# Patient Record
Sex: Female | Born: 1935 | Race: White | Hispanic: No | Smoking: Former smoker
Health system: Southern US, Community
[De-identification: ages and names within clinical notes are randomized; demographics above are authoritative.]

## PROBLEM LIST (undated history)

## (undated) DIAGNOSIS — Z86010 Personal history of colonic polyps: Secondary | ICD-10-CM

## (undated) DIAGNOSIS — I10 Essential (primary) hypertension: Secondary | ICD-10-CM

## (undated) DIAGNOSIS — K589 Irritable bowel syndrome without diarrhea: Secondary | ICD-10-CM

## (undated) DIAGNOSIS — K5730 Diverticulosis of large intestine without perforation or abscess without bleeding: Secondary | ICD-10-CM

## (undated) DIAGNOSIS — M069 Rheumatoid arthritis, unspecified: Secondary | ICD-10-CM

## (undated) DIAGNOSIS — J8410 Pulmonary fibrosis, unspecified: Secondary | ICD-10-CM

## (undated) DIAGNOSIS — I499 Cardiac arrhythmia, unspecified: Secondary | ICD-10-CM

## (undated) DIAGNOSIS — E785 Hyperlipidemia, unspecified: Secondary | ICD-10-CM

## (undated) HISTORY — DX: Pulmonary fibrosis, unspecified: J84.10

## (undated) HISTORY — DX: Personal history of colonic polyps: Z86.010

## (undated) HISTORY — DX: Essential (primary) hypertension: I10

## (undated) HISTORY — DX: Cardiac arrhythmia, unspecified: I49.9

## (undated) HISTORY — DX: Diverticulosis of large intestine without perforation or abscess without bleeding: K57.30

## (undated) HISTORY — PX: APPENDECTOMY: SHX54

## (undated) HISTORY — DX: Irritable bowel syndrome, unspecified: K58.9

## (undated) HISTORY — PX: CYSTECTOMY: SUR359

## (undated) HISTORY — PX: CHOLECYSTECTOMY: SHX55

## (undated) HISTORY — DX: Rheumatoid arthritis, unspecified: M06.9

## (undated) HISTORY — PX: ABDOMINAL HYSTERECTOMY: SHX81

## (undated) HISTORY — DX: Hyperlipidemia, unspecified: E78.5

---

## 1981-05-15 ENCOUNTER — Encounter: Payer: Self-pay | Admitting: Internal Medicine

## 1999-02-24 ENCOUNTER — Encounter: Admission: RE | Admit: 1999-02-24 | Discharge: 1999-02-24 | Payer: Self-pay | Admitting: Family Medicine

## 1999-03-03 ENCOUNTER — Other Ambulatory Visit: Admission: RE | Admit: 1999-03-03 | Discharge: 1999-03-03 | Payer: Self-pay | Admitting: Family Medicine

## 2000-01-12 ENCOUNTER — Ambulatory Visit (HOSPITAL_COMMUNITY): Admission: RE | Admit: 2000-01-12 | Discharge: 2000-01-12 | Payer: Self-pay | Admitting: Gastroenterology

## 2000-01-12 ENCOUNTER — Encounter (INDEPENDENT_AMBULATORY_CARE_PROVIDER_SITE_OTHER): Payer: Self-pay | Admitting: Specialist

## 2000-02-25 ENCOUNTER — Encounter: Payer: Self-pay | Admitting: Family Medicine

## 2000-02-25 ENCOUNTER — Encounter: Admission: RE | Admit: 2000-02-25 | Discharge: 2000-02-25 | Payer: Self-pay | Admitting: Family Medicine

## 2000-08-09 ENCOUNTER — Other Ambulatory Visit: Admission: RE | Admit: 2000-08-09 | Discharge: 2000-08-09 | Payer: Self-pay | Admitting: Family Medicine

## 2001-03-21 ENCOUNTER — Encounter: Payer: Self-pay | Admitting: Family Medicine

## 2001-03-21 ENCOUNTER — Encounter: Admission: RE | Admit: 2001-03-21 | Discharge: 2001-03-21 | Payer: Self-pay | Admitting: Family Medicine

## 2002-03-22 ENCOUNTER — Encounter: Admission: RE | Admit: 2002-03-22 | Discharge: 2002-03-22 | Payer: Self-pay | Admitting: Family Medicine

## 2002-03-22 ENCOUNTER — Encounter: Payer: Self-pay | Admitting: Family Medicine

## 2003-08-12 ENCOUNTER — Encounter: Admission: RE | Admit: 2003-08-12 | Discharge: 2003-08-12 | Payer: Self-pay | Admitting: Family Medicine

## 2003-09-09 ENCOUNTER — Ambulatory Visit (HOSPITAL_COMMUNITY): Admission: RE | Admit: 2003-09-09 | Discharge: 2003-09-10 | Payer: Self-pay | Admitting: General Surgery

## 2003-09-09 ENCOUNTER — Encounter (INDEPENDENT_AMBULATORY_CARE_PROVIDER_SITE_OTHER): Payer: Self-pay | Admitting: *Deleted

## 2004-05-21 ENCOUNTER — Ambulatory Visit: Payer: Self-pay | Admitting: Internal Medicine

## 2004-06-03 ENCOUNTER — Ambulatory Visit: Payer: Self-pay | Admitting: Internal Medicine

## 2004-06-21 ENCOUNTER — Ambulatory Visit: Payer: Self-pay | Admitting: Internal Medicine

## 2004-06-23 ENCOUNTER — Ambulatory Visit (HOSPITAL_COMMUNITY): Admission: RE | Admit: 2004-06-23 | Discharge: 2004-06-23 | Payer: Self-pay | Admitting: Internal Medicine

## 2004-06-24 ENCOUNTER — Ambulatory Visit: Payer: Self-pay

## 2004-08-10 ENCOUNTER — Ambulatory Visit: Payer: Self-pay | Admitting: Internal Medicine

## 2004-08-23 ENCOUNTER — Ambulatory Visit: Payer: Self-pay | Admitting: Internal Medicine

## 2005-02-21 ENCOUNTER — Ambulatory Visit: Payer: Self-pay | Admitting: Internal Medicine

## 2005-04-20 ENCOUNTER — Ambulatory Visit: Payer: Self-pay | Admitting: Internal Medicine

## 2005-04-25 ENCOUNTER — Ambulatory Visit: Payer: Self-pay | Admitting: Internal Medicine

## 2005-05-03 ENCOUNTER — Ambulatory Visit: Payer: Self-pay | Admitting: Internal Medicine

## 2005-05-19 ENCOUNTER — Ambulatory Visit: Payer: Self-pay | Admitting: Internal Medicine

## 2005-05-30 ENCOUNTER — Ambulatory Visit: Payer: Self-pay

## 2005-07-19 ENCOUNTER — Ambulatory Visit: Payer: Self-pay | Admitting: Internal Medicine

## 2005-08-25 ENCOUNTER — Ambulatory Visit: Payer: Self-pay | Admitting: Internal Medicine

## 2005-08-30 ENCOUNTER — Ambulatory Visit: Payer: Self-pay | Admitting: Internal Medicine

## 2005-09-29 ENCOUNTER — Ambulatory Visit: Payer: Self-pay | Admitting: Internal Medicine

## 2005-12-01 ENCOUNTER — Ambulatory Visit: Payer: Self-pay | Admitting: Internal Medicine

## 2005-12-06 ENCOUNTER — Encounter: Payer: Self-pay | Admitting: Cardiology

## 2005-12-06 ENCOUNTER — Ambulatory Visit: Payer: Self-pay

## 2005-12-09 ENCOUNTER — Ambulatory Visit: Payer: Self-pay | Admitting: Internal Medicine

## 2005-12-30 ENCOUNTER — Ambulatory Visit: Payer: Self-pay | Admitting: Internal Medicine

## 2006-01-06 ENCOUNTER — Ambulatory Visit: Payer: Self-pay | Admitting: Internal Medicine

## 2006-02-13 ENCOUNTER — Ambulatory Visit: Payer: Self-pay | Admitting: Internal Medicine

## 2006-03-16 DIAGNOSIS — K5730 Diverticulosis of large intestine without perforation or abscess without bleeding: Secondary | ICD-10-CM

## 2006-03-16 HISTORY — DX: Diverticulosis of large intestine without perforation or abscess without bleeding: K57.30

## 2006-03-22 ENCOUNTER — Ambulatory Visit: Payer: Self-pay | Admitting: Gastroenterology

## 2006-04-10 ENCOUNTER — Encounter (INDEPENDENT_AMBULATORY_CARE_PROVIDER_SITE_OTHER): Payer: Self-pay | Admitting: *Deleted

## 2006-04-10 ENCOUNTER — Ambulatory Visit: Payer: Self-pay | Admitting: Gastroenterology

## 2006-04-10 DIAGNOSIS — Z860100 Personal history of colon polyps, unspecified: Secondary | ICD-10-CM

## 2006-04-10 HISTORY — DX: Personal history of colonic polyps: Z86.010

## 2006-04-10 HISTORY — DX: Personal history of colon polyps, unspecified: Z86.0100

## 2006-04-25 ENCOUNTER — Ambulatory Visit: Payer: Self-pay | Admitting: Internal Medicine

## 2006-07-25 ENCOUNTER — Ambulatory Visit: Payer: Self-pay | Admitting: Internal Medicine

## 2006-12-06 ENCOUNTER — Encounter: Payer: Self-pay | Admitting: Internal Medicine

## 2006-12-06 DIAGNOSIS — I714 Abdominal aortic aneurysm, without rupture, unspecified: Secondary | ICD-10-CM | POA: Insufficient documentation

## 2006-12-06 DIAGNOSIS — I10 Essential (primary) hypertension: Secondary | ICD-10-CM | POA: Insufficient documentation

## 2006-12-06 DIAGNOSIS — E785 Hyperlipidemia, unspecified: Secondary | ICD-10-CM

## 2006-12-06 DIAGNOSIS — K5730 Diverticulosis of large intestine without perforation or abscess without bleeding: Secondary | ICD-10-CM | POA: Insufficient documentation

## 2007-10-01 ENCOUNTER — Encounter: Payer: Self-pay | Admitting: Internal Medicine

## 2007-10-09 ENCOUNTER — Telehealth: Payer: Self-pay | Admitting: Internal Medicine

## 2007-10-10 ENCOUNTER — Telehealth: Payer: Self-pay | Admitting: Internal Medicine

## 2007-10-19 ENCOUNTER — Ambulatory Visit: Payer: Self-pay | Admitting: Internal Medicine

## 2007-10-19 DIAGNOSIS — M069 Rheumatoid arthritis, unspecified: Secondary | ICD-10-CM

## 2007-10-19 LAB — CONVERTED CEMR LAB
BUN: 12 mg/dL (ref 6–23)
CO2: 30 meq/L (ref 19–32)
Creatinine, Ser: 0.8 mg/dL (ref 0.4–1.2)
GFR calc Af Amer: 90 mL/min
GFR calc non Af Amer: 75 mL/min
Glucose, Bld: 108 mg/dL — ABNORMAL HIGH (ref 70–99)
Potassium: 5 meq/L (ref 3.5–5.1)
Sodium: 142 meq/L (ref 135–145)

## 2007-11-08 ENCOUNTER — Encounter: Payer: Self-pay | Admitting: Internal Medicine

## 2007-11-09 ENCOUNTER — Ambulatory Visit: Payer: Self-pay

## 2007-11-09 ENCOUNTER — Encounter: Payer: Self-pay | Admitting: Internal Medicine

## 2007-12-29 ENCOUNTER — Telehealth: Payer: Self-pay | Admitting: Internal Medicine

## 2008-04-06 ENCOUNTER — Emergency Department (HOSPITAL_COMMUNITY): Admission: EM | Admit: 2008-04-06 | Discharge: 2008-04-06 | Payer: Self-pay | Admitting: Family Medicine

## 2008-05-24 ENCOUNTER — Emergency Department (HOSPITAL_COMMUNITY): Admission: EM | Admit: 2008-05-24 | Discharge: 2008-05-24 | Payer: Self-pay | Admitting: Family Medicine

## 2008-07-25 ENCOUNTER — Encounter: Admission: RE | Admit: 2008-07-25 | Discharge: 2008-07-25 | Payer: Self-pay | Admitting: Internal Medicine

## 2008-12-17 ENCOUNTER — Encounter: Payer: Self-pay | Admitting: Nurse Practitioner

## 2009-01-01 ENCOUNTER — Telehealth: Payer: Self-pay | Admitting: Gastroenterology

## 2009-01-05 ENCOUNTER — Ambulatory Visit: Payer: Self-pay | Admitting: Internal Medicine

## 2009-01-05 DIAGNOSIS — R439 Unspecified disturbances of smell and taste: Secondary | ICD-10-CM

## 2009-01-05 DIAGNOSIS — R634 Abnormal weight loss: Secondary | ICD-10-CM

## 2009-01-05 LAB — CONVERTED CEMR LAB
Calcium: 9.4 mg/dL (ref 8.4–10.5)
Chloride: 104 meq/L (ref 96–112)
Creatinine, Ser: 0.9 mg/dL (ref 0.4–1.2)
GFR calc non Af Amer: 64.84 mL/min (ref >60)
HCT: 48.7 % — ABNORMAL HIGH (ref 36.0–46.0)
MCV: 84.9 fL (ref 78.0–100.0)
Neutrophils Relative %: 70.2 % (ref 43.0–77.0)
Platelets: 253 10*3/uL (ref 150.0–400.0)
Potassium: 3.5 meq/L (ref 3.5–5.1)
RBC: 5.73 M/uL — ABNORMAL HIGH (ref 3.87–5.11)
Sodium: 147 meq/L — ABNORMAL HIGH (ref 135–145)
WBC: 9.1 10*3/uL (ref 4.5–10.5)

## 2009-01-06 ENCOUNTER — Ambulatory Visit: Payer: Self-pay | Admitting: Internal Medicine

## 2009-01-12 ENCOUNTER — Encounter: Payer: Self-pay | Admitting: Gastroenterology

## 2009-01-12 ENCOUNTER — Ambulatory Visit: Payer: Self-pay | Admitting: Gastroenterology

## 2009-01-12 DIAGNOSIS — K253 Acute gastric ulcer without hemorrhage or perforation: Secondary | ICD-10-CM | POA: Insufficient documentation

## 2009-02-11 DIAGNOSIS — K259 Gastric ulcer, unspecified as acute or chronic, without hemorrhage or perforation: Secondary | ICD-10-CM | POA: Insufficient documentation

## 2009-02-12 ENCOUNTER — Ambulatory Visit: Payer: Self-pay | Admitting: Gastroenterology

## 2009-02-12 DIAGNOSIS — K250 Acute gastric ulcer with hemorrhage: Secondary | ICD-10-CM | POA: Insufficient documentation

## 2009-02-12 LAB — CONVERTED CEMR LAB
Folate: >20 ng/mL
Iron: 79 ug/dL (ref 42–145)
Saturation Ratios: 27 % (ref 20.0–50.0)
Transferrin: 209 mg/dL — ABNORMAL LOW (ref 212.0–360.0)
Vitamin B-12: 519 pg/mL (ref 211–911)

## 2009-03-31 ENCOUNTER — Ambulatory Visit: Payer: Self-pay | Admitting: Gastroenterology

## 2009-05-18 ENCOUNTER — Ambulatory Visit: Payer: Self-pay | Admitting: Gastroenterology

## 2009-05-19 ENCOUNTER — Encounter: Payer: Self-pay | Admitting: Gastroenterology

## 2009-05-19 LAB — CONVERTED CEMR LAB: UREASE: NEGATIVE

## 2009-07-08 ENCOUNTER — Encounter (INDEPENDENT_AMBULATORY_CARE_PROVIDER_SITE_OTHER): Payer: Self-pay | Admitting: *Deleted

## 2009-07-13 ENCOUNTER — Ambulatory Visit: Payer: Self-pay | Admitting: Gastroenterology

## 2009-07-15 ENCOUNTER — Ambulatory Visit: Payer: Self-pay | Admitting: Gastroenterology

## 2009-07-15 LAB — CONVERTED CEMR LAB: UREASE: NEGATIVE

## 2009-09-01 ENCOUNTER — Ambulatory Visit: Payer: Self-pay | Admitting: Gastroenterology

## 2009-09-26 ENCOUNTER — Observation Stay (HOSPITAL_COMMUNITY): Admission: EM | Admit: 2009-09-26 | Discharge: 2009-09-28 | Payer: Self-pay | Admitting: Emergency Medicine

## 2009-09-26 ENCOUNTER — Emergency Department (HOSPITAL_COMMUNITY)
Admission: EM | Admit: 2009-09-26 | Discharge: 2009-09-26 | Payer: Self-pay | Source: Home / Self Care | Admitting: Family Medicine

## 2009-09-27 ENCOUNTER — Encounter (INDEPENDENT_AMBULATORY_CARE_PROVIDER_SITE_OTHER): Payer: Self-pay | Admitting: Internal Medicine

## 2009-09-27 ENCOUNTER — Ambulatory Visit: Payer: Self-pay | Admitting: Surgery

## 2010-02-26 ENCOUNTER — Telehealth: Payer: Self-pay | Admitting: Gastroenterology

## 2010-06-06 ENCOUNTER — Encounter: Payer: Self-pay | Admitting: Internal Medicine

## 2010-06-15 NOTE — Letter (Signed)
Summary: EGD Instructions  [facility]  [address] [place], [place] [address]   Phone: [phone]  Fax: [phone]       [name]    03/24/1932    MRN: [id number]       Procedure Day Dorna Bloom:  Wednesday  07/15/09     Arrival Time:   7:30am     Procedure Time:  8:30am     Location of Procedure:                    _X  _ [facility] (4th Floor) _   PREPARATION FOR ENDOSCOPY   On  Wednesday 03/02  THE DAY OF THE PROCEDURE:  1.   No solid foods, milk or milk products are allowed after midnight the night before your procedure.  2.   Do not drink anything colored red or purple.  Avoid juices with pulp.  No orange juice.  3.  You may drink clear liquids until 6:30am , which is 2 hours before your procedure.                                                                                                CLEAR LIQUIDS INCLUDE: Water Jello Ice Popsicles Tea (sugar ok, no milk/cream) Powdered fruit flavored drinks Coffee (sugar ok, no milk/cream) Gatorade Juice: apple, white grape, white cranberry  Lemonade Clear bullion, consomm, broth Carbonated beverages (any kind) Strained chicken noodle soup Hard Candy   MEDICATION INSTRUCTIONS  Unless otherwise instructed, you should take regular prescription medications with a small sip of water as early as possible the morning of your procedure.                OTHER INSTRUCTIONS  You will need a responsible adult at least 75 years of age to accompany you and drive you home.   This person must remain in the waiting room during your procedure.  Wear loose fitting clothing that is easily removed.  Leave jewelry and other valuables at home.  However, you may wish to bring a book to read or an iPod/MP3 player to listen to music as you wait for your procedure to start.  Remove all body piercing jewelry and leave at home.  Total time from sign-in until discharge is approximately 2-3  hours.  You should go home directly after your procedure and rest.  You can resume normal activities the day after your procedure.  The day of your procedure you should not:   Drive   Make legal decisions   Operate machinery   Drink alcohol   Return to work  You will receive specific instructions about eating, activities and medications before you leave.    The above instructions have been reviewed and explained to me by   Ezra Sites RN  July 13, 2009 9:37 AM    I fully understand and can verbalize these instructions _____________________________ Date _________

## 2010-06-15 NOTE — Miscellaneous (Signed)
Summary: Orders Update  Clinical Lists Changes 

## 2010-06-15 NOTE — Assessment & Plan Note (Signed)
Summary: 6WK FU/YF    History of Present Illness Visit Type: Follow-up Visit Primary GI MD: Sheryn Bison MD FACP FAGA Primary Provider: Shary Decamp, MD Requesting Provider: n/a Chief Complaint: F/u from EGD. Pt states that she is fine and denies any GI complaints  History of Present Illness:   She denies GI complaints at this time all daily 60 mg. She recently has begun Humira injections for her rheumatoid arthritis and is on the care of Dr. Dareen Piano at [facility]. Her disorder taste has returned, wrapped as good, and she is gaining weight. She continues to avoid NSAIDs. Serial endoscopies that showed good healing of her gastric ulceration.   GI Review of Systems      Denies abdominal pain, acid reflux, belching, bloating, chest pain, dysphagia with liquids, dysphagia with solids, heartburn, loss of appetite, nausea, vomiting, vomiting blood, weight loss, and  weight gain.        Denies anal fissure, black tarry stools, change in bowel habit, constipation, diarrhea, diverticulosis, fecal incontinence, heme positive stool, hemorrhoids, irritable bowel syndrome, jaundice, light color stool, liver problems, rectal bleeding, and  rectal pain.    Current Medications (verified): 1)  Premarin 0.9 Mg  Tabs (Estrogens Conjugated) .... One By Mouth Once Daily 2)  Claritin 10 Mg  Tabs (Loratadine) .... One By Mouth Once Daily 3)  Diovan 160 Mg  Tabs (Valsartan) .... Take One By Mouth Once Daily 4)  Plaquenil 200 Mg  Tabs (Hydroxychloroquine Sulfate) .... One By Mouth Two Times A Day 5)  Folic Acid Mg  Tabs (Folic Acid) .... Take One By Mouth Once Daily 6)  Prednisone 5 Mg  Tabs (Prednisone) .... Take Two By Mouth Once Daily 7)  Valium 5 Mg Tabs (Diazepam) .... Take One By Mouth Once Daily As Needed 8)  Norvasc 5 Mg Tabs (Amlodipine Besylate) .... Take One By Mouth Once Daily 9)  Dexilant 60 Mg Cpdr (Dexlansoprazole) .... Q A.m, 30 Mins Prior To Breakfast 10)  Tylenol  Arthritis Pain 650 Mg Cr-Tabs (Acetaminophen) .... One Tablet By Mouth Two Times A Day As Needed For Pain 11)  Humira Injection (Dosage Unknown) .... Every Two Weeks  Allergies (verified): 1)  ! * Statins 2)  ! * Statins  Past History:  Past medical, surgical, family and social histories (including risk factors) reviewed for relevance to current acute and chronic problems.  Past Medical History: Reviewed history from 01/05/2009 and no changes required. Diverticulosis, colon Nov. 2007 Hyperlipidemia Hypertension Irregular Heart Rate Rheumatoid Arthritis Irritable Bowel Syndrome  Past Surgical History: Reviewed history from 02/12/2009 and no changes required. Hysterectomy Cholecystectomy Appendectomy Cystectomy-left breast  Family History: Reviewed history from 02/12/2009 and no changes required. No FH of Colon Cancer: Family History of Heart Disease: brothers, sisters Family History of Liver Disease: Daughter  Family History of Diabetes: Brother  Social History: Reviewed history from 02/12/2009 and no changes required. Occupation: retired divorced with two sons and three daughters Patient is a former smoker. -stopped 25 years ago Alcohol Use - no Daily Caffeine Use 2 cups  Illicit Drug Use - no Patient does not get regular exercise.   Review of Systems       The patient complains of arthritis/joint pain.  The patient denies allergy/sinus, anemia, anxiety-new, back pain, blood in urine, breast changes/lumps, change in vision, confusion, cough, coughing up blood, depression-new, fainting, fatigue, fever, headaches-new, hearing problems, heart murmur, heart rhythm changes, itching, menstrual pain, muscle pains/cramps, night sweats, nosebleeds, pregnancy symptoms, shortness of breath,  skin rash, sleeping problems, sore throat, swelling of feet/legs, swollen lymph glands, thirst - excessive , urination - excessive , urination changes/pain, urine leakage, vision changes, and  voice change.    Vital Signs:  Patient profile:   77 year old female Height:      63.5 inches Weight:      192 pounds BMI:     33.60 BSA:     1.91 Pulse rate:   88 / minute Pulse rhythm:   regular BP sitting:   124 / 80  (left arm) Cuff size:   regular  Vitals Entered By: Ok Anis CMA (September 01, 2009 2:18 PM)  Physical Exam  General:  Well developed, well nourished, no acute distress.healthy appearing.   Head:  Normocephalic and atraumatic. Eyes:  PERRLA, no icterus.exam deferred to patient's ophthalmologist.   Psych:  Alert and cooperative. Normal mood and affect.She is very unhappy with her current state of ill health and has very numerous and vocal complaints.   Impression & Recommendations:  Problem # 1:  GASTRIC ULCER (ICD-531.90) Assessment Improved Continue daily PPI therapy and avoidance of NSAIDs. I do not think followup endoscopy indicated at this time unless symptoms worsen. I've given her samples of Dexilant and coupons for purchase. The patient relates that Prilosec does not give her any symptomatic relief.  Problem # 2:  DYSGEUSIA (ICD-781.1) Assessment: Improved  Problem # 3:  RHEUMATOID ARTHRITIS (ICD-714.0) Assessment: Improved Continue medications and followup with Dr. Dareen Piano as planned.  Patient Instructions: 1)  Please continue current medications.  2)  The medication list was reviewed and reconciled.  All changed / newly prescribed medications were explained.  A complete medication list was provided to the patient / caregiver. 3)  Copy sent to : [facility] and Dr. Dorma Russell. 4)  Please continue current medications.  5)  Please schedule a follow-up appointment as needed.   Appended Document: 6WK FU/YF    Clinical Lists Changes  Medications: Changed medication from DEXILANT 60 MG CPDR (DEXLANSOPRAZOLE) q a.m, 30 mins prior to breakfast to DEXILANT 60 MG CPDR (DEXLANSOPRAZOLE) q a.m, 30 mins prior to  breakfast

## 2010-06-15 NOTE — Miscellaneous (Signed)
  Clinical Lists Changes  Orders: Added new Test order of TLB-H Pylori Screen Gastric Biopsy (83013-CLOTEST) - Signed

## 2010-06-15 NOTE — Letter (Signed)
Summary: Patient [facility] Biopsy Results  [facility]  [address] [place], [place] [address]   Phone: [phone]  Fax: [phone]        May 19, 2009 MRN: [id number]    VONETTA FOULK [address] [place], [place]  [address]    Dear Ms. Woo,  I am pleased to inform you that the biopsies taken during your recent endoscopic examination did not show any evidence of cancer upon pathologic examination.  Additional information/recommendations:  __No further action is needed at this time.  Please follow-up with      your primary care physician for your other healthcare needs.  __ Please call [phone] to schedule a return visit to review      your condition.  _x_ Continue with the treatment plan as outlined on the day of your      exam.Followup endoscopy in 8 weeks time suggested.  __ You should have a repeat endoscopic examination for this problem              in _ months/years.   Please call us if you are having persistent problems or have questions about your condition that have not been fully answered at this time.  Sincerely,  Mardella Layman MD [facility]  This letter has been electronically signed by your physician.  Appended Document: Patient Notice-Endo Biopsy Results letter mailed 01.5.11

## 2010-06-15 NOTE — Procedures (Signed)
Summary: Upper Endoscopy  Patient: Veronica Stone Note: All result statuses are Final unless otherwise noted.  Tests: (1) Upper Endoscopy (EGD)   EGD Upper Endoscopy       DONE     [facility]     [address]     [place], [place]  [address]           ENDOSCOPY PROCEDURE REPORT           PATIENT:  [name], [name]  MR#:  [id number]     BIRTHDATE:  1931/07/01, 77 yrs. old  GENDER:  female           ENDOSCOPIST:  Vania Rea. Jarold Motto, MD, [facility]     Referred by:           PROCEDURE DATE:  07/15/2009     PROCEDURE:  EGD with biopsy     ASA CLASS:     INDICATIONS:  ulcer status           MEDICATIONS:   Fentanyl 25 mcg IV, Versed 2 mg IV     TOPICAL ANESTHETIC:  Exactacain Spray           DESCRIPTION OF PROCEDURE:   After the risks benefits and     alternatives of the procedure were thoroughly explained, informed     consent was obtained.  The  endoscope was introduced through the     mouth and advanced to the second portion of the duodenum, without     limitations.  The instrument was slowly withdrawn as the mucosa     was fully examined.     <<PROCEDUREIMAGES>>           An ulcer was found. healing antral ulcer 1.5 cm.clo bx. done     Normal duodenal folds were noted.  The esophagus and     gastroesophageal junction were completely normal in appearance.     There were multiple polyps identified. benign fundal polyps and     pancreatic rest adjacent to ulcer.see pictures.    fundal polyps.     The scope was then withdrawn from the patient and the procedure     completed.           COMPLICATIONS:  None           ENDOSCOPIC IMPRESSION:     1) Ulcer     2) Normal duodenal folds     3) Normal esophagus     4) Polyps, multiple     healing NSAID ulcer.CLO BX. DONE.PANCREATIC REST IN ANTRUM.     RECOMMENDATIONS:     office f/u 6 weeks.           REPEAT EXAM:  No           ______________________________     Vania Rea. Jarold Motto, MD, [facility]           CC:        n.     eSIGNED:   Vania Rea. Patterson at 07/15/2009 09:03 AM           Les Pou, [id number]  Note: An exclamation mark (!) indicates a result that was not dispersed into the flowsheet. Document Creation Date: 07/15/2009 9:03 AM _______________________________________________________________________  (1) Order result status: Final Collection or observation date-time: 07/15/2009 08:53 Requested date-time:  Receipt date-time:  Reported date-time:  Referring Physician:   Ordering Physician: Sheryn Bison [id number]) Specimen Source:  Source: Launa Grill Order Number: [id number] Lab site:

## 2010-06-15 NOTE — Progress Notes (Signed)
Summary: Dexilant refill   Phone Note Call from Patient   Call For: Dr Coral Spikes Reason for Call: Refill Medication Summary of Call: [facility] says they faxed [place] an order for Dexilant and we have not responded. Initial call taken by: Leanor Kail [facility],  February 26, 2010 4:06 PM  Follow-up for Phone Call        rx sent Follow-up by: Harlow Mares CMA Duncan Dull),  February 26, 2010 4:09 PM    Prescriptions: DEXILANT 60 MG CPDR (DEXLANSOPRAZOLE) q a.m, 30 mins prior to breakfast  #30 x 11   Entered by:   Harlow Mares CMA (AAMA)   Authorized by:   Mardella Layman MD [facility]   Signed by:   Harlow Mares CMA (AAMA) on 02/26/2010   Method used:   [facility]ron[facility] to        [facility] [phone]* (retail)       [address]       [place], [place]  [address]       Ph: [phone] or [phone]       Fax: [phone]   RxID:   [id number]

## 2010-06-15 NOTE — Miscellaneous (Signed)
  Clinical Lists Changes  Problems: Added new problem of GASTRIC ULCER (ICD-531.90) Orders: Added new Test order of TLB-H Pylori Screen Gastric Biopsy (83013-CLOTEST) - Signed

## 2010-06-15 NOTE — Miscellaneous (Signed)
Summary: Orders Update/clotest  Clinical Lists Changes  Orders: Added new Test order of TLB-H Pylori Screen Gastric Biopsy (83013-CLOTEST) - Signed 

## 2010-06-15 NOTE — Procedures (Signed)
Summary: Upper Endoscopy  Patient: Veronica Stone Note: All result statuses are Final unless otherwise noted.  Tests: (1) Upper Endoscopy (EGD)   EGD Upper Endoscopy       DONE     [facility]     [address]     [place], [place]  [address]           ENDOSCOPY PROCEDURE REPORT           PATIENT:  [name], [name]  MR#:  [id number]     BIRTHDATE:  01-Sep-1931, 77 yrs. old  GENDER:  female           ENDOSCOPIST:  Vania Rea. Jarold Motto, MD, [facility]     Referred by:           PROCEDURE DATE:  05/18/2009     PROCEDURE:  EGD with biopsy     ASA CLASS:  Class II     INDICATIONS:  follow-up of gastric ulcer           MEDICATIONS:   Fentanyl 50 mcg IV, Versed 5 mg IV     TOPICAL ANESTHETIC:  Exactacain Spray           DESCRIPTION OF PROCEDURE:   After the risks benefits and     alternatives of the procedure were thoroughly explained, informed     consent was obtained.  The LB [id number]-H180 [id number] endoscope was     introduced through the mouth and advanced to the second portion of     the duodenum, without limitations.  The instrument was slowly     withdrawn as the mucosa was fully examined.     <<PROCEDUREIMAGES>>           An ulcer was found in the antrum. It was likely benign, clean     based, deep and improved. CLO AND REGULAR BIOPSIES FOR PATH EXAM     DONE.NO GASTRITIS NOTED,,,NO OUTLET NARROWING NOTED.  normal     ampulla.  Normal duodenal folds were noted.  The esophagus and     gastroesophageal junction were completely normal in appearance.     Retroflexed views revealed no abnormalities.    The scope was then     withdrawn from the patient and the procedure completed.           COMPLICATIONS:  None           ENDOSCOPIC IMPRESSION:     1) Ulcer in the antrum     2) Normal ampulla     3) Normal duodenal folds     4) Normal esophagus     SLOWLY HEALING NSAID ULCER.R/O ATYPIADOUBT.     RECOMMENDATIONS:     1) await biopsy results     2) continue PPI     REPEAT  EGD IN 8 WEEKS.AVOID NSAID'S.           REPEAT EXAM:  No           ______________________________     Vania Rea. Jarold Motto, MD, [facility]           CC:  Shary Decamp MD           n.     Rosalie DoctorMarland Kitchen   Vania Rea. Patterson at 05/18/2009 09:19 AM           Les Pou, [id number]  Note: An exclamation mark (!) indicates a result that was not dispersed into the flowsheet. Document Creation Date: 05/18/2009 9:18 AM _______________________________________________________________________  (1) Order result  status: Final Collection or observation date-time: 05/18/2009 09:10 Requested date-time:  Receipt date-time:  Reported date-time:  Referring Physician:   Ordering Physician: Sheryn Bison [phone]) Specimen Source:  Source: Launa Grill Order Number: [id number] Lab site:

## 2010-06-15 NOTE — Miscellaneous (Signed)
Summary: Clotest  Clinical Lists Changes  Orders: Added new Test order of TLB-H Pylori Screen Gastric Biopsy (83013-CLOTEST) - Signed 

## 2010-06-15 NOTE — Miscellaneous (Signed)
Summary: [facility] PV  Clinical Lists Changes  Observations: Added new observation of ALLERGY REV: Done (07/13/2009 8:41)

## 2010-07-13 ENCOUNTER — Encounter (HOSPITAL_COMMUNITY): Payer: Medicare Other | Attending: Rheumatology

## 2010-07-13 DIAGNOSIS — M069 Rheumatoid arthritis, unspecified: Secondary | ICD-10-CM | POA: Insufficient documentation

## 2010-07-28 ENCOUNTER — Encounter (HOSPITAL_COMMUNITY): Payer: Medicare Other | Attending: Rheumatology

## 2010-07-28 DIAGNOSIS — M069 Rheumatoid arthritis, unspecified: Secondary | ICD-10-CM | POA: Insufficient documentation

## 2010-08-02 LAB — GLUCOSE, CAPILLARY

## 2010-08-02 LAB — BASIC METABOLIC PANEL
Chloride: 102 mEq/L (ref 96–112)
GFR calc Af Amer: >60 mL/min (ref >60)
Glucose, Bld: 141 mg/dL — ABNORMAL HIGH (ref 70–99)
Potassium: 4.6 mEq/L (ref 3.5–5.1)

## 2010-08-03 LAB — URINALYSIS, ROUTINE W REFLEX MICROSCOPIC
Nitrite: NEGATIVE
Specific Gravity, Urine: 1.026 (ref 1.005–1.030)
pH: 5.5 (ref 5.0–8.0)

## 2010-08-03 LAB — CK TOTAL AND CKMB (NOT AT ARMC)
CK, MB: 0.6 ng/mL (ref 0.3–4.0)
Relative Index: INVALID (ref 0.0–2.5)
Total CK: 38 U/L (ref 7–177)

## 2010-08-03 LAB — TROPONIN I: Troponin I: <0.01 ng/mL (ref 0.00–0.06)

## 2010-08-03 LAB — DIFFERENTIAL
Eosinophils Relative: 0 % (ref 0–5)
Lymphs Abs: 1.9 10*3/uL (ref 0.7–4.0)
Monocytes Relative: 10 % (ref 3–12)
Neutro Abs: 7.4 10*3/uL (ref 1.7–7.7)
Neutrophils Relative %: 72 % (ref 43–77)

## 2010-08-03 LAB — BASIC METABOLIC PANEL
CO2: 24 mEq/L (ref 19–32)
Calcium: 9.4 mg/dL (ref 8.4–10.5)

## 2010-08-03 LAB — CBC
MCHC: 34 g/dL (ref 30.0–36.0)
MCV: 86.3 fL (ref 78.0–100.0)
Platelets: 405 10*3/uL — ABNORMAL HIGH (ref 150–400)
RBC: 5.05 MIL/uL (ref 3.87–5.11)
WBC: 10.3 10*3/uL (ref 4.0–10.5)

## 2010-08-03 LAB — URINE MICROSCOPIC-ADD ON

## 2010-08-03 LAB — URINE CULTURE: Colony Count: 40000

## 2010-08-03 LAB — TSH: TSH: 1.256 u[IU]/mL (ref 0.350–4.500)

## 2010-08-30 LAB — POCT URINALYSIS DIP (DEVICE)
Glucose, UA: NEGATIVE mg/dL
Ketones, ur: NEGATIVE mg/dL
Specific Gravity, Urine: <=1.005 (ref 1.005–1.030)
Urobilinogen, UA: 0.2 mg/dL (ref 0.0–1.0)

## 2010-10-01 NOTE — Op Note (Signed)
NAMELARAINE, SAMET                        ACCOUNT NO.:  [id number]   MEDICAL RECORD NO.:  [id number]                   PATIENT TYPE:  OIB   LOCATION:  2550                                 FACILITY:  [facility]   PHYSICIAN:  Adolph Pollack, M.D.            DATE OF BIRTH:  1931/11/24   DATE OF PROCEDURE:  09/09/2003  DATE OF DISCHARGE:                                 OPERATIVE REPORT   PREOPERATIVE DIAGNOSIS:  Symptomatic cholelithiasis.   POSTOPERATIVE DIAGNOSIS:  Symptomatic cholelithiasis.   PROCEDURE:  Laparoscopic cholecystectomy with interoperative cholangiogram.   SURGEON:  Adolph Pollack, M.D.   ASSISTANT:  Anselm Pancoast. Zachery Dakins, M.D.   ANESTHESIA:  General.   INDICATIONS FOR PROCEDURE:  Ms. Mullens is a 77 year old female with post  prandial epigastric pain particularly following a fatty meal.  Evaluation  has demonstrated multiple small gallstones and the pain is consistent with  biliary colic.  She now presents for elective laparoscopic cholecystectomy.  The procedure and the risks were discussed with her preoperatively.   SURGICAL TECHNIQUE:  She was seen in the holding area and then brought to  the operating room and placed supine on the operating table and a general  anesthetic was administered.  The abdominal wall was then sterilely prepped  and draped.  Dilute Marcaine solution was infiltrated in the supraumbilical  region and a small supraumbilical incision was made through the skin and  subcutaneous tissue down to the level of the fascia.  A small incision was  made in the fascia and in the peritoneum and the peritoneal cavity was  entered under direct vision.  A purse-string suture of 0 Vicryl was placed  around the fascial edges.  A Hasson trocar was introduced into the  peritoneal cavity and a pneumoperitoneum was created by insufflation of CO2  gas.  The laparoscope was introduced.  She was placed in reversed  Trendelenburg position with the right  side tilted slightly up.  An 11 mm  trocar was placed through an epigastric incision and two 5 mm trocars were  placed in the right and mid abdomen.  The fundus of the gallbladder was  grasped and retracted toward the right shoulder.  Filmy adhesions between  the gallbladder and omentum were dissected bluntly.  The infundibulum was  grasped and using blunt dissection and electrocautery, it was mobilized.  Using blunt dissection, the cystic artery was identified, isolated, and a  window created around it.  Likewise, the cystic duct was identified,  isolated and a window created around it.  The cystic artery was then clipped  and divided.  The cystic duct was clipped just above the cystic duct  gallbladder junction.  A small incision was made at the cystic duct  gallbladder junction and small stone fragments milked back from the cystic  duct.  A cholangiogram catheter was passed through the anterior abdominal  wall and placed in the cystic duct and cholangiogram was  performed.   Under real time fluoroscopy, dilute contrast material was injected into the  cystic duct which was of short to moderate length.  The common hepatic,  right and left hepatic, and common bile ducts all filled promptly and the  contrast drained promptly to the duodenum without obvious evidence of  significant obstruction.  The final report is pending the radiologist  interpretation.  The cholangiogram catheter was removed, the cystic duct was  clipped three times staying inside and divided.  A small posterior branch of  the cystic artery was identified, clipped and divided.  The gallbladder was  dissected free from the liver bed using electrocautery and placed in an  endopouch bag.  The gallbladder fossa was irrigated and hemostasis was  adequate.  There was no evidence of bile leak.  I again irrigated out the  perihepatic area and inspected the area one more time and it was hemostatic  without evidence of bile leak.   Most of the fluid was evacuated.  The  gallbladder was removed through the supraumbilical incision and the  supraumbilical fascial defect was closed under laparoscopic vision by  tightening up and tying down the purse-string suture.  Subsequently, the  trocars were removed and the pneumoperitoneum was released.  The skin  incisions were closed with 4-0 Monocryl subcuticular stitches followed by  Steri-Strips and sterile dressings.  She tolerated the procedure well  without any apparent complication.  She was subsequently taken to the  recovery room in satisfactory condition.                                               Adolph Pollack, M.D.    Kari Baars  D:  09/09/2003  T:  09/09/2003  Job:  [id number]   cc:   Mosetta Putt, M.D.  [address] [place]  [place] [address]  Fax: [phone]

## 2010-10-01 NOTE — Op Note (Signed)
[place]  Patient:    Veronica Stone, Veronica Stone                     MRN: [id number] Proc. Date: 01/12/00 Adm. Date:  [id number] Attending:  Mardella Layman CC:         Vania Rea. Jarold Motto, M.D. [facility]   Operative Report  PROCEDURE PERFORMED:  ENDOSCOPIST:  Vania Rea. Jarold Motto, M.D. [facility]  INDICATIONS FOR PROCEDURE:  Ms. Ibach is a 77 year old patient who has a long history of irritable bowel syndrome and diverticulosis.  She was seen in my office on November 23, 1999 with two months of epigastric abdominal pain unresponsive to H2 blocker therapy.  She was placed on Protonix 40 mg a day, outpatient ultrasonography was unremarkable.  It was felt that endoscopy was indicated because of continued complaints of abdominal pain.  Risks and benefits of this procedure were explained in detail and she agreed to proceed as planed.  Preoperative cardiopulmonary and mental status exams were unremarkable.  Endoscopy:  Throughout this procedure, patient on pulse oximetry and cardiac monitoring.  The patient tolerated the procedure well, receiving supplementa.l low flow oxygen by nasal cannula throughout the procedure.  She was anesthetized with Cetacaine spray of the oropharynx, fentanyl 50 mcg IV and Versed 5 mg IV.  She was intubated using the Olympus adult video endoscope. The hypopharynx and vocal cords appeared normal.  The esophagus throughout its length also appeared normal.  The endoscope was easily passed to the stomach. There were no retained food products or blood in the stomach.  There was a chronic gastritis of the fundus and body of the stomach but no ulcerations or erosions.  However, in the antrum of the stomach there were two deep erosions consistent with healing ulcerations with  associated surrounding granular inflamed mucosa.  Pictures were obtained as were mucosal biopsies.  Biopsies were also obtained and placed in CLO media.  The pyloric channel,  duodenal bulb, and duodenal sweep was unremarkable.  Retroflex view of the fundus and cardia of the stomach otherwise was unremarkable.  She was then extubated without difficulty, and returned in stable condition to the recovery room for observation.  She tolerated this procedure well.  ASSESSMENT:  This endoscopy shows what appears to be two NSAID-induced ulcerations which were approximately 90% healed at this time.  RECOMMENDATIONS: 1. Follow up on CLO biopsy and gastric biopsies to exclude Helicobacter pylori    infection. 2. Continue to avoid NSAIDs and use Protonix 40 mg a day. 3. Office follow-up in six to eight weeks time. DD:  01/12/00 TD:  01/12/00 Job: [id number]

## 2010-11-02 ENCOUNTER — Ambulatory Visit (INDEPENDENT_AMBULATORY_CARE_PROVIDER_SITE_OTHER): Payer: Medicare Other | Admitting: Pulmonary Disease

## 2010-11-02 ENCOUNTER — Encounter: Payer: Self-pay | Admitting: Pulmonary Disease

## 2010-11-02 VITALS — BP 122/80 | HR 98 | Temp 97.7°F | Ht 63.75 in | Wt 227.0 lb

## 2010-11-02 DIAGNOSIS — J8410 Pulmonary fibrosis, unspecified: Secondary | ICD-10-CM | POA: Insufficient documentation

## 2010-11-02 DIAGNOSIS — J439 Emphysema, unspecified: Secondary | ICD-10-CM | POA: Insufficient documentation

## 2010-11-02 NOTE — Progress Notes (Signed)
  Subjective:    Patient ID: Veronica Stone, female    DOB: 07-Aug-1931, 77 y.o.   MRN: [id number]  HPI 79/F, Ex smoker , quit '90 for evaluation of dyspnea She was recently treated with lasix for edema & bibasal crackles, given lasix, not much benefit. She reports dyspnea on exertion but is more limited by her arthritis. Occasional dry cough + She smoked 30 Pyrs before quitting in '90. She reports RA x3 yrs, tried humira, remicaid, now on rituxan, steroid dependent, on 5mg  prednisone Worked at [employer], retired '88 Ct abd 8/10 sub plerual fibrosis CXR 5/11bibasal scarring Satn 91% RA 2022-07-03 siblings died of 'fibrosis' She denies frequent colds, wheezing, orthopnea, chest pain, or pND    Review of Systems  Constitutional: Negative for fever and unexpected weight change.  HENT: Negative for ear pain, nosebleeds, congestion, sore throat, rhinorrhea, sneezing, trouble swallowing, dental problem, postnasal drip and sinus pressure.   Eyes: Negative for redness and itching.  Respiratory: Positive for cough and shortness of breath. Negative for chest tightness and wheezing.   Cardiovascular: Positive for leg swelling. Negative for palpitations.  Gastrointestinal: Negative for nausea and vomiting.  Genitourinary: Negative for dysuria.  Musculoskeletal: Positive for joint swelling.  Skin: Negative for rash.  Neurological: Negative for headaches.  Hematological: Bruises/bleeds easily.  Psychiatric/Behavioral: Negative for dysphoric mood. The patient is not nervous/anxious.        Objective:   Physical Exam Gen. Pleasant, well-nourished, in no distress, normal affect ENT - no lesions, no post nasal drip Neck: No JVD, no thyromegaly, no carotid bruits Lungs: no use of accessory muscles, no dullness to percussion, bibasal 1/2 rales, no rhonchi  Cardiovascular: Rhythm regular, heart sounds  normal, no murmurs or gallops, no peripheral edema Abdomen: soft and non-tender, no  hepatosplenomegaly, BS normal. Musculoskeletal: No deformities, no cyanosis or clubbing Neuro:  alert, non focal        Assessment & Plan:

## 2010-11-02 NOTE — Patient Instructions (Signed)
CT scan of your lungs Schedule breathing test

## 2010-11-02 NOTE — Assessment & Plan Note (Signed)
Exam & CT abdomen from 5/11 are consistent with pulmonary fibrosis ? 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

## 2010-11-03 ENCOUNTER — Ambulatory Visit (INDEPENDENT_AMBULATORY_CARE_PROVIDER_SITE_OTHER)
Admission: RE | Admit: 2010-11-03 | Discharge: 2010-11-03 | Disposition: A | Discharge status: Home / Self Care | Payer: Medicare Other | Source: Ambulatory Visit | Attending: Pulmonary Disease | Admitting: Pulmonary Disease

## 2010-11-03 DIAGNOSIS — J8410 Pulmonary fibrosis, unspecified: Secondary | ICD-10-CM

## 2010-11-08 ENCOUNTER — Telehealth: Payer: Self-pay | Admitting: Pulmonary Disease

## 2010-11-08 NOTE — Telephone Encounter (Signed)
Call was to advise pt of results. Pt aware of ct results and to keep appt on 12-03-10 for pft and OV. Carron Curie, CMA

## 2010-12-03 ENCOUNTER — Ambulatory Visit (INDEPENDENT_AMBULATORY_CARE_PROVIDER_SITE_OTHER): Payer: Medicare Other | Admitting: Pulmonary Disease

## 2010-12-03 ENCOUNTER — Encounter: Payer: Self-pay | Admitting: Pulmonary Disease

## 2010-12-03 VITALS — BP 120/78 | HR 97 | Temp 97.7°F | Ht 64.0 in | Wt 223.0 lb

## 2010-12-03 DIAGNOSIS — J8410 Pulmonary fibrosis, unspecified: Secondary | ICD-10-CM

## 2010-12-03 LAB — PULMONARY FUNCTION TEST

## 2010-12-03 NOTE — Progress Notes (Signed)
PFT was done today.  

## 2010-12-03 NOTE — Patient Instructions (Addendum)
You have fibrosis in your lungs which may be related to your arthritis. We will monitor you every 6 months. No medications for now.

## 2010-12-03 NOTE — Progress Notes (Signed)
  Subjective:    Patient ID: Veronica Stone, female    DOB: 1932/01/07, 77 y.o.   MRN: [id number]  HPI 79/F, Ex smoker , quit '90 with RA for FU of pulmonary fibrosis. She has a strong family history - 07/03/2022 siblings died of 'fibrosis'  She was recently treated with lasix for edema & bibasal crackles, given lasix, not much benefit.  She reports dyspnea on exertion but is more limited by her arthritis. Occasional dry cough +  She smoked 30 Pyrs before quitting in '90.  She reports RA x3 yrs, tried humira, remicaid, now on rituxan, steroid dependent, on 5mg  prednisone [name]) Worked at [employer], retired '88  Ct abd 8/10 sub plerual fibrosis  CXR 5/11bibasal scarring     12/03/2010 PFTs showed preserved lung volumes with FVC 77% & TLC 80%, DLCO reduced at 49%. She desatn to 86% on second lap. HRCT showed diffuse chronic interstitial coarsening with scattered areas of bronchiectasis bilaterally & subpleural  honeycombing consistent with UIP.   She denies frequent colds, wheezing, orthopnea, chest pain, or pND     Review of Systems Pt denies any significant  nasal congestion or excess secretions, fever, chills, sweats, unintended wt loss, pleuritic or exertional cp, orthopnea pnd or leg swelling.  Pt also denies any obvious fluctuation in symptoms with weather or environmental change or other alleviating or aggravating factors.    Pt denies any increase in rescue therapy over baseline, denies waking up needing it or having early am exacerbations or coughing/wheezing/ or dyspnea      Objective:   Physical Exam Gen. Pleasant, well-nourished, in no distress ENT - no lesions, no post nasal drip Neck: No JVD, no thyromegaly, no carotid bruits Lungs: no use of accessory muscles, no dullness to percussion, bibasal  rales  Cardiovascular: Rhythm regular, heart sounds  normal, no murmurs or gallops, no peripheral edema Musculoskeletal: No deformities, no cyanosis or clubbing          Assessment & Plan:

## 2010-12-03 NOTE — Progress Notes (Signed)
SATURATION QUALIFICATIONS:  Patient Saturations on Room Air at Rest   91%  Patient Saturations on Room Air while Ambulating =  86%  Patient Saturations on 2 Liters of oxygen while Ambulating = 93%

## 2010-12-07 NOTE — Assessment & Plan Note (Signed)
Start on home O2 for exertion & sleep Unfortunately biopsy would not help to differentiate rheumatoid lung from IPF since both would have a 'UIP' pattern Will try to enroll in study - but doubt she will qualify. Strong family history goes in favor of IPF. Monitor q 6 months

## 2010-12-29 ENCOUNTER — Encounter (HOSPITAL_COMMUNITY): Payer: Medicare Other | Attending: Rheumatology

## 2010-12-29 DIAGNOSIS — M069 Rheumatoid arthritis, unspecified: Secondary | ICD-10-CM | POA: Insufficient documentation

## 2011-01-12 ENCOUNTER — Encounter (HOSPITAL_COMMUNITY): Payer: Medicare Other

## 2011-02-14 ENCOUNTER — Telehealth: Payer: Self-pay | Admitting: Pulmonary Disease

## 2011-02-14 DIAGNOSIS — J8410 Pulmonary fibrosis, unspecified: Secondary | ICD-10-CM

## 2011-02-14 NOTE — Telephone Encounter (Signed)
With Medicare patients, testing must be done within 30 days of ordering o2. Therefore, this must be repeated in order for pt to qualify for oxygen.  Get her in for testing & pl document - OK to combine with OV with TP

## 2011-02-14 NOTE — Telephone Encounter (Signed)
OK to write for 2 L O2 Bring her in for OV with TP after to document improvemnt

## 2011-02-14 NOTE — Telephone Encounter (Signed)
Order has been sent to [facility] for O2 and the pt is aware. She is scheduled for follow-up with TP on Thurs., 11/1 to see how she is doing since on oxygen.

## 2011-02-14 NOTE — Telephone Encounter (Signed)
I spoke with the pt and moved the pt appt to 03-07-11 so it falls within 30 days. Carron Curie, CMA

## 2011-02-14 NOTE — Telephone Encounter (Signed)
I spoke with the pt and she states back in July when she saw Dr. Vassie Loll he rec oxygen but she refused at the time. She states she now realizes she needs the oxygen. She is requesting that an order also be sent for portable oxygen as well. Please advise if ok to place a new order for oxygen? Thanks. Carron Curie, CMA

## 2011-02-23 ENCOUNTER — Telehealth: Payer: Self-pay | Admitting: Pulmonary Disease

## 2011-02-23 NOTE — Telephone Encounter (Signed)
I spoke with pt and she states she is suppose to be set up with oxygen. I advised her on 02/14/11 she spoke with a nurse advising her she will need to re qualify for O2 before she can be set up b/c it has been past the 30 day limit for medicare pt's. Pt is coming in to see TP tomorrow at 4:15. Nothing further was needed

## 2011-02-24 ENCOUNTER — Ambulatory Visit (INDEPENDENT_AMBULATORY_CARE_PROVIDER_SITE_OTHER)
Admission: RE | Admit: 2011-02-24 | Discharge: 2011-02-24 | Disposition: A | Discharge status: Home / Self Care | Payer: Medicare Other | Source: Ambulatory Visit | Attending: Adult Health | Admitting: Adult Health

## 2011-02-24 ENCOUNTER — Encounter: Payer: Self-pay | Admitting: Adult Health

## 2011-02-24 ENCOUNTER — Ambulatory Visit (INDEPENDENT_AMBULATORY_CARE_PROVIDER_SITE_OTHER): Payer: Medicare Other | Admitting: Adult Health

## 2011-02-24 VITALS — BP 124/78 | HR 106 | Temp 97.4°F | Ht 63.4 in | Wt 228.4 lb

## 2011-02-24 DIAGNOSIS — R0602 Shortness of breath: Secondary | ICD-10-CM

## 2011-02-24 DIAGNOSIS — J8410 Pulmonary fibrosis, unspecified: Secondary | ICD-10-CM

## 2011-02-24 NOTE — Assessment & Plan Note (Signed)
PF w/ activity desaturations now aggrees to O2 .  Will check xray to r/o progression of PF  Plan;  We are setting you up  Oxygen at 2 l/m with activity and then At bedtime   follow up Dr. Vassie Loll  In 4 weeks and As needed   Please contact office for sooner follow up if symptoms do not improve or worsen or seek emergency care

## 2011-02-24 NOTE — Progress Notes (Signed)
  Subjective:    Patient ID: Veronica Stone, female    DOB: 11/14/1931, 77 y.o.   MRN: [id number]  HPI 79/F, Ex smoker , quit '90 with RA for FU of pulmonary fibrosis.  She has a strong family history - 07/07/2022 siblings died of 'fibrosis'  She was recently treated with lasix for edema & bibasal crackles, given lasix, not much benefit.  She reports dyspnea on exertion but is more limited by her arthritis. Occasional dry cough +  She smoked 30 Pyrs before quitting in '90.  She reports RA x3 yrs, tried humira, remicaid, now on rituxan, steroid dependent, on 5mg  prednisone Veronica Stone) Worked at [employer], retired '88  Ct abd 8/10 sub plerual fibrosis  CXR 5/11bibasal scarring     12/03/2010 PFTs showed preserved lung volumes with FVC 77% & TLC 80%, DLCO reduced at 49%. She desatn to 86% on second lap. HRCT showed diffuse chronic interstitial coarsening with scattered areas of bronchiectasis bilaterally & subpleural  honeycombing consistent with UIP >>declines O2   02/24/2011 Follow up  Pt returns for follow up to discuss oxygen. She was seen few months ago for IPF  ? related to her RA. She was recommended to start on O2 at that time but declined. She returns today requesting to start on this. She feels short of breath with minimal activity No dyspnea at rest . NO chest pain or discolored mucus.  O2 sat walking 87% on room air. Adequate sats with walking on 2 l/m .       Review of Systems  Constitutional:   No  weight loss, night sweats,  Fevers, chills, fatigue, or  lassitude.  HEENT:   No headaches,  Difficulty swallowing,  Tooth/dental problems, or  Sore throat,                No sneezing, itching, ear ache, nasal congestion, post nasal drip,   CV:  No chest pain,  Orthopnea, PND,  anasarca, dizziness, palpitations, syncope.   GI  No heartburn, indigestion, abdominal pain, nausea, vomiting, diarrhea, change in bowel habits, loss of appetite, bloody stools.   Resp:  No coughing up  of blood.   No chest wall deformity  Skin: no rash or lesions.  GU: no dysuria, change in color of urine, no urgency or frequency.  No flank pain, no hematuria   MS:     No decreased range of motion.     Psych:  No change in mood or affect. No depression or anxiety.  No memory loss.        Objective:   Physical Exam  Gen. Pleasant, well-nourished, in no distress ENT - no lesions, no post nasal drip Neck: No JVD, no thyromegaly, no carotid bruits Lungs: no use of accessory muscles, no dullness to percussion, bibasal  rales  Cardiovascular: Rhythm regular, heart sounds  normal, no murmurs or gallops, 1+peripheral edema Musculoskeletal: No deformities, no cyanosis or clubbing , Arthritic changes.         Assessment & Plan:

## 2011-02-24 NOTE — Patient Instructions (Addendum)
I will call with xray results.  We are setting you up  Oxygen at 2 l/m with activity and then At bedtime   follow up Dr. Vassie Loll  In 4 weeks and As needed   Please contact office for sooner follow up if symptoms do not improve or worsen or seek emergency care

## 2011-03-02 ENCOUNTER — Other Ambulatory Visit: Payer: Self-pay | Admitting: *Deleted

## 2011-03-02 DIAGNOSIS — J8410 Pulmonary fibrosis, unspecified: Secondary | ICD-10-CM

## 2011-03-07 ENCOUNTER — Ambulatory Visit: Payer: Medicare Other | Admitting: Adult Health

## 2011-03-08 ENCOUNTER — Ambulatory Visit: Payer: Medicare Other | Admitting: Adult Health

## 2011-03-10 ENCOUNTER — Ambulatory Visit: Payer: Medicare Other | Admitting: Adult Health

## 2011-03-13 ENCOUNTER — Telehealth: Payer: Self-pay | Admitting: Pulmonary Disease

## 2011-03-13 NOTE — Telephone Encounter (Signed)
ONO shows 48 min satn < 88% Good to use O2 during sleep & walking

## 2011-03-16 ENCOUNTER — Encounter: Payer: Self-pay | Admitting: Pulmonary Disease

## 2011-03-17 ENCOUNTER — Ambulatory Visit: Payer: Medicare Other | Admitting: Adult Health

## 2011-03-18 NOTE — Telephone Encounter (Signed)
I informed pt of RA's findings and recommendations. Pt verbalized understanding  

## 2011-03-24 ENCOUNTER — Encounter: Payer: Self-pay | Admitting: Adult Health

## 2011-03-24 ENCOUNTER — Ambulatory Visit (INDEPENDENT_AMBULATORY_CARE_PROVIDER_SITE_OTHER): Payer: Medicare Other | Admitting: Adult Health

## 2011-03-24 VITALS — BP 132/82 | HR 109 | Temp 97.2°F | Ht 63.25 in | Wt 230.0 lb

## 2011-03-24 DIAGNOSIS — J8410 Pulmonary fibrosis, unspecified: Secondary | ICD-10-CM

## 2011-03-24 NOTE — Patient Instructions (Signed)
Continue on Oxygen at 2 l/m with activity and then At bedtime   follow up Dr. Vassie Loll  In 6-8  weeks and As needed   Please contact office for sooner follow up if symptoms do not improve or worsen or seek emergency care

## 2011-03-24 NOTE — Progress Notes (Signed)
Subjective:    Patient ID: Veronica Stone, female    DOB: November 30, 1931, 77 y.o.   MRN: [id number]  HPI 79/F, Ex smoker , quit '90 with RA for FU of pulmonary fibrosis.  She has a strong family history - 07/24/22 siblings died of 'fibrosis'  She was recently treated with lasix for edema & bibasal crackles, given lasix, not much benefit.  She reports dyspnea on exertion but is more limited by her arthritis. Occasional dry cough +  She smoked 30 Pyrs before quitting in '90.  She reports RA x3 yrs, tried humira, remicaid, now on rituxan, steroid dependent, on 5mg  prednisone [name]) Worked at [employer], retired '88  Ct abd 8/10 sub plerual fibrosis  CXR 5/11bibasal scarring     12/03/2010 PFTs showed preserved lung volumes with FVC 77% & TLC 80%, DLCO reduced at 49%. She desatn to 86% on second lap. HRCT showed diffuse chronic interstitial coarsening with scattered areas of bronchiectasis bilaterally & subpleural  honeycombing consistent with UIP >>declines O2   02/24/2011 Follow up  Pt returns for follow up to discuss oxygen. She was seen few months ago for IPF  ? related to her RA. She was recommended to start on O2 at that time but declined. She returns today requesting to start on this. She feels short of breath with minimal activity No dyspnea at rest . NO chest pain or discolored mucus.  O2 sat walking 87% on room air. Adequate sats with walking on 2 l/m .  >>O2 started   03/24/2011 Follow up  Pt returns for follow up . Pt states that her breathing seems to be improved since last visit. she is using o2 during the day with exertion and with sleep. She has not been set up for a small/lightweight O2 system as of yet , can not use heavy tanks because she uses a cane. [facility] this issue as order was sent 02/24/11. ONO showed nocturnal desaturations and patient was continued on oxygen at nighttime. Patient denies any chest pain, cough, increased leg  swelling, or fever. Chest x-ray last visit showed no change in chronic lung markings.     Review of Systems  Constitutional:   No  weight loss, night sweats,  Fevers, chills, ++ fatigue, or  lassitude.  HEENT:   No headaches,  Difficulty swallowing,  Tooth/dental problems, or  Sore throat,                No sneezing, itching, ear ache, nasal congestion, post nasal drip,   CV:  No chest pain,  Orthopnea, PND,  anasarca, dizziness, palpitations, syncope.   GI  No heartburn, indigestion, abdominal pain, nausea, vomiting, diarrhea, change in bowel habits, loss of appetite, bloody stools.   Resp:  No coughing up of blood.   No chest wall deformity  Skin: no rash or lesions.  GU: no dysuria, change in color of urine, no urgency or frequency.  No flank pain, no hematuria   MS:     No decreased range of motion.     Psych:  No change in mood or affect. No depression or anxiety.  No memory loss.        Objective:   Physical Exam  Gen. Pleasant, well-nourished, in no distress ENT - no lesions, no post nasal drip Neck: No JVD, no thyromegaly, no carotid bruits Lungs: no use of accessory muscles, no dullness to percussion, bibasal  rales  Cardiovascular: Rhythm regular, heart sounds  normal, no  murmurs or gallops, 1+peripheral edema Musculoskeletal: No deformities, no cyanosis or clubbing , Arthritic changes.         Assessment & Plan:

## 2011-03-24 NOTE — Assessment & Plan Note (Signed)
Pulmonary fibrosis, with associated hypoxia. Chest x-ray shows no progression of fibrosis. Patient is tolerating oxygen well. [facility]'ll [facility] to supply a more portable oxygen device for patient's as she can be more active. We'll have patient return in 6-8 weeks with Dr. Vassie Loll and As needed

## 2011-03-25 ENCOUNTER — Telehealth: Payer: Self-pay | Admitting: Adult Health

## 2011-03-25 NOTE — Telephone Encounter (Signed)
Will forward to [facility] box- Libby aware.

## 2011-03-25 NOTE — Telephone Encounter (Signed)
Spoke to lecretia at [facility] pt failed the [employer] will send over order to do helios eval Humberto Seals

## 2011-03-25 NOTE — Telephone Encounter (Signed)
Spoke with pt. She states has still not heard from  [facility] regarding portable o2. I called and spoke with [place]. She states that what happened was, they did receive our order to eval pt for small o2 device- best fit program. She states that when they went to her home and did the eval, pt did not do well and so she faxed [place] an order to Dr Reginia Naas attn to have pt put on portable liquid system b/c she did better on this. So, all they are waiting on is order from [place] for portable liquid o2. TP, pls advise if this is okay to send thanks!

## 2011-03-25 NOTE — Telephone Encounter (Signed)
Please address this with [facility] and get this handled asap.

## 2011-03-29 ENCOUNTER — Telehealth: Payer: Self-pay | Admitting: Pulmonary Disease

## 2011-03-29 NOTE — Telephone Encounter (Signed)
I spoke with pt and she states she still has not heard from [facility] about getting set up with portable oxygen. According to last note Veronica Stone sent order to [facility] for pt to be set up with helios. I called and had to [facility] for Veronica Stone to see what is going on.

## 2011-03-29 NOTE — Telephone Encounter (Signed)
I spoke with Veronica Stone and she states they are waiting for an order for pt to be evaluated for liquid oxygen. I have given order to JJ to have TP to sign so we can fax over to [facility].

## 2011-03-31 NOTE — Telephone Encounter (Signed)
Order from [facility] signed and dated by TP.  Faxed back to [facility] @ [phone] and placed in TP's scan folder.  Called spoke with patient, she is aware.

## 2011-04-14 ENCOUNTER — Telehealth: Payer: Self-pay | Admitting: Pulmonary Disease

## 2011-04-14 MED ORDER — AZITHROMYCIN 250 MG PO TABS: ORAL_TABLET | ORAL | Status: AC

## 2011-04-14 NOTE — Telephone Encounter (Signed)
Spoke with patient has a productive cough increased SOB ,wheezing x 1 week, denies any fever ,chest tightness,  States just dont feel good. Dr Kriste Basque please advise Thanks. Allergies  Allergen Reactions  . Statins     REACTION: "i could not take it"   Pharmacy [facility]

## 2011-04-14 NOTE — Telephone Encounter (Signed)
Per CY---ok to send in  zpak #1 take as directed with no refills.  Called and spoke with the pt and she is aware of rx sent to the pharmacy.  Pt voiced her understanding of this

## 2011-05-05 ENCOUNTER — Ambulatory Visit: Payer: Medicare Other | Admitting: Pulmonary Disease

## 2011-05-19 DIAGNOSIS — R928 Other abnormal and inconclusive findings on diagnostic imaging of breast: Secondary | ICD-10-CM | POA: Diagnosis not present

## 2011-05-30 ENCOUNTER — Other Ambulatory Visit: Payer: Self-pay | Admitting: Gastroenterology

## 2011-06-03 ENCOUNTER — Encounter: Payer: Self-pay | Admitting: Pulmonary Disease

## 2011-06-03 ENCOUNTER — Ambulatory Visit (INDEPENDENT_AMBULATORY_CARE_PROVIDER_SITE_OTHER): Payer: Medicare Other | Admitting: Pulmonary Disease

## 2011-06-03 VITALS — BP 118/78 | HR 109 | Temp 97.9°F | Ht 63.25 in | Wt 228.8 lb

## 2011-06-03 DIAGNOSIS — J8410 Pulmonary fibrosis, unspecified: Secondary | ICD-10-CM | POA: Diagnosis not present

## 2011-06-03 NOTE — Assessment & Plan Note (Signed)
check O2 level while walking Use Oxygen with activity & during sleep Breathing test - spirometry  - on next visit Let me know if you are interested in an exercise program or ina  Research study about fibrosis in families.

## 2011-06-03 NOTE — Patient Instructions (Signed)
check O2 level while walking Use Oxygen with activity & during sleep Breathing test - spirometry  - on next visit Let me know if you are interested in an exercise program or ina  Research study about fibrosis in families. 

## 2011-06-03 NOTE — Progress Notes (Signed)
  Subjective:    Patient ID: Veronica Stone, female    DOB: 09-29-1931, 78 y.o.   MRN: [id number]  HPI  78/F, Ex smoker , quit '90 with RA for FU of pulmonary fibrosis.  She has a strong family history - 2022-07-10 siblings died of 'fibrosis'  Trial of  Lasix >> not much benefit.  She reports dyspnea on exertion but is more limited by her arthritis. Occasional dry cough +  She smoked 30 Pyrs before quitting in '90. Worked at [employer], retired '88 She reports RA since 2008, tried humira, remicaid, now on rituxan, steroid dependent, on 5mg  prednisone [name])   Ct abd 8/10 sub plerual fibrosis  CXR 5/11bibasal scarring   PFTs 7/12 showed preserved lung volumes with FVC 77% & TLC 80%, DLCO reduced at 49%.  She desatn to 86% on second lap.  HRCT showed diffuse chronic interstitial coarsening with scattered areas of bronchiectasis bilaterally & subpleural honeycombing consistent with UIP  Initially declined but started O2 in oct'12    06/03/2011 No energy Compliant with O2 on exertion & during sleep Not interested in rehab Wonders if her fibrosis is familial but not interested in research study  Patient denies any chest pain, cough, increased leg swelling, or fever. Chest x-ray last visit showed no change in chronic lung markings    Review of Systems Patient denies significant dyspnea,cough, hemoptysis,  chest pain, palpitations, pedal edema, orthopnea, paroxysmal nocturnal dyspnea, lightheadedness, nausea, vomiting, abdominal or  leg pains      Objective:   Physical Exam  Gen. Pleasant, well-nourished, in no distress ENT - no lesions, no post nasal drip Neck: No JVD, no thyromegaly, no carotid bruits Lungs: no use of accessory muscles, no dullness to percussion, BL 1/3 rales, no rhonchi  Cardiovascular: Rhythm regular, heart sounds  normal, no murmurs or gallops, no peripheral edema Musculoskeletal: No deformities, no cyanosis or clubbing         Assessment & Plan:

## 2011-06-20 ENCOUNTER — Other Ambulatory Visit: Payer: Self-pay | Admitting: Rheumatology

## 2011-06-21 ENCOUNTER — Other Ambulatory Visit: Payer: Self-pay | Admitting: Rheumatology

## 2011-06-27 ENCOUNTER — Other Ambulatory Visit: Payer: Self-pay | Admitting: Rheumatology

## 2011-06-27 DIAGNOSIS — M069 Rheumatoid arthritis, unspecified: Secondary | ICD-10-CM | POA: Diagnosis not present

## 2011-06-27 DIAGNOSIS — M1990 Unspecified osteoarthritis, unspecified site: Secondary | ICD-10-CM | POA: Diagnosis not present

## 2011-06-29 ENCOUNTER — Other Ambulatory Visit (HOSPITAL_COMMUNITY): Payer: Self-pay | Admitting: *Deleted

## 2011-07-04 ENCOUNTER — Encounter (HOSPITAL_COMMUNITY)
Admission: RE | Admit: 2011-07-04 | Discharge: 2011-07-04 | Disposition: A | Discharge status: Home / Self Care | Payer: Medicare Other | Source: Ambulatory Visit | Attending: Rheumatology | Admitting: Rheumatology

## 2011-07-04 DIAGNOSIS — M069 Rheumatoid arthritis, unspecified: Secondary | ICD-10-CM | POA: Insufficient documentation

## 2011-07-04 MED ORDER — SODIUM CHLORIDE 0.9 % IV SOLN
1000.0000 mg | INTRAVENOUS | Status: DC
  Administered 2011-07-04: 1000 mg via INTRAVENOUS
  Filled 2011-07-04: qty 100

## 2011-07-04 MED ORDER — METHYLPREDNISOLONE SODIUM SUCC 125 MG IJ SOLR
100.0000 mg | INTRAMUSCULAR | Status: DC
  Administered 2011-07-04: 100 mg via INTRAVENOUS
  Filled 2011-07-04: qty 2

## 2011-07-04 MED ORDER — ACETAMINOPHEN 325 MG PO TABS
650.0000 mg | ORAL_TABLET | ORAL | Status: DC
  Administered 2011-07-04: 650 mg via ORAL
  Filled 2011-07-04: qty 2

## 2011-07-18 ENCOUNTER — Other Ambulatory Visit (HOSPITAL_COMMUNITY): Payer: Self-pay | Admitting: *Deleted

## 2011-07-19 ENCOUNTER — Encounter (HOSPITAL_COMMUNITY)
Admission: RE | Admit: 2011-07-19 | Discharge: 2011-07-19 | Disposition: A | Discharge status: Home / Self Care | Payer: Medicare Other | Source: Ambulatory Visit | Attending: Rheumatology | Admitting: Rheumatology

## 2011-07-19 DIAGNOSIS — M069 Rheumatoid arthritis, unspecified: Secondary | ICD-10-CM | POA: Insufficient documentation

## 2011-07-19 MED ORDER — ACETAMINOPHEN 325 MG PO TABS
650.0000 mg | ORAL_TABLET | ORAL | Status: AC
  Administered 2011-07-19: 650 mg via ORAL

## 2011-07-19 MED ORDER — ACETAMINOPHEN 325 MG PO TABS
ORAL_TABLET | ORAL | Status: AC
  Administered 2011-07-19: 650 mg via ORAL
  Filled 2011-07-19: qty 2

## 2011-07-19 MED ORDER — SODIUM CHLORIDE 0.9 % IV SOLN
1000.0000 mg | INTRAVENOUS | Status: AC
  Administered 2011-07-19: 1000 mg via INTRAVENOUS
  Filled 2011-07-19: qty 100

## 2011-07-19 MED ORDER — METHYLPREDNISOLONE SODIUM SUCC 125 MG IJ SOLR
INTRAMUSCULAR | Status: AC
  Administered 2011-07-19: 100 mg via INTRAVENOUS
  Filled 2011-07-19: qty 2

## 2011-07-19 MED ORDER — METHYLPREDNISOLONE SODIUM SUCC 125 MG IJ SOLR
100.0000 mg | INTRAMUSCULAR | Status: AC
  Administered 2011-07-19: 100 mg via INTRAVENOUS

## 2011-07-19 MED ORDER — SODIUM CHLORIDE 0.9 % IV SOLN
Freq: Once | INTRAVENOUS | Status: AC
  Administered 2011-07-19: 09:00:00 via INTRAVENOUS

## 2011-09-14 ENCOUNTER — Encounter: Payer: Self-pay | Admitting: *Deleted

## 2011-09-20 ENCOUNTER — Ambulatory Visit (INDEPENDENT_AMBULATORY_CARE_PROVIDER_SITE_OTHER): Payer: Medicare Other | Admitting: Gastroenterology

## 2011-09-20 ENCOUNTER — Encounter: Payer: Self-pay | Admitting: Gastroenterology

## 2011-09-20 VITALS — BP 124/84 | HR 100 | Ht 65.0 in | Wt 227.0 lb

## 2011-09-20 DIAGNOSIS — K219 Gastro-esophageal reflux disease without esophagitis: Secondary | ICD-10-CM | POA: Diagnosis not present

## 2011-09-20 DIAGNOSIS — M069 Rheumatoid arthritis, unspecified: Secondary | ICD-10-CM | POA: Diagnosis not present

## 2011-09-20 DIAGNOSIS — J84112 Idiopathic pulmonary fibrosis: Secondary | ICD-10-CM | POA: Diagnosis not present

## 2011-09-20 MED ORDER — DEXLANSOPRAZOLE 60 MG PO CPDR: DELAYED_RELEASE_CAPSULE | ORAL | Status: DC

## 2011-09-20 NOTE — Patient Instructions (Signed)
Your prescription(s) have been sent to you pharmacy.   

## 2011-09-20 NOTE — Progress Notes (Signed)
This is a very pleasant 78 year old Caucasian female with severe rheumatoid arthritis on chronic prednisone and IV Rituxan per Dr. Dareen Piano in rheumatology. She recently has been diagnosed with idiopathic pulmonary fibrosis, and is now  oxygen-dependent ,and has shortness of breath with exertion, but apparently no symptoms of congestive heart failure, although she does have peripheral edema and is on daily Lasix. GI-wise, she has chronic acid reflux managed with Dexilant 60 mg a day. She denies dysphagia, hepatobiliary or lower gastrointestinal problems. Patient is not on aspirin or NSAIDs. She is up-to-date on her endoscopy and colonoscopy exams. She has 3 sisters who apparently have died from advancing pulmonary fibrosis.  Current Medications, Allergies, Past Medical History, Past Surgical History, Family History and Social History were reviewed in [employer] record.     Physical Exam: Elderly patient in no acute distress who is breathing easily without oxygen. Blood pressure is 124/84, pulse 100 and regular, and weight 227 pounds with a BMI of 37.77. I cannot appreciate stigmata of chronic liver disease or thyromegaly or lymphadenopathy. She has fine crackles in both lung fields,velcro sounding breath sounds. She appear to be in a regular rhythm without significant murmurs gallops or rubs. Her abdomen does not show organomegaly, masses, tenderness, or ascites. Bowel sounds are normal. There is peripheral edema, but the patient will not that me palpate her legs or joints. She has ulnar deformity of her hands without active joint swelling. Her mental status is clear. She is somewhat depressed about her diagnoses.   Assessment and Plan: Chronic GERD well-controlled with Dexilant 60 mg a day. I am not sure that this patient takes this medication regularly because of expense. Previous use of Prilosec did not control her symptoms. I have given her samples of this medication and coupons  for cost saving. I have reviewed her reflux regime with her, and have advised her to continue all of her medications as per her other physicians. We will see her on a when necessary basis as needed.  Please copy her primary care physician, referring physician, and pertinent subspecialists. No diagnosis found.

## 2011-09-21 DIAGNOSIS — M949 Disorder of cartilage, unspecified: Secondary | ICD-10-CM | POA: Diagnosis not present

## 2011-09-21 DIAGNOSIS — M818 Other osteoporosis without current pathological fracture: Secondary | ICD-10-CM | POA: Diagnosis not present

## 2011-09-21 DIAGNOSIS — N390 Urinary tract infection, site not specified: Secondary | ICD-10-CM | POA: Diagnosis not present

## 2011-09-21 DIAGNOSIS — E669 Obesity, unspecified: Secondary | ICD-10-CM | POA: Diagnosis not present

## 2011-09-21 DIAGNOSIS — M899 Disorder of bone, unspecified: Secondary | ICD-10-CM | POA: Diagnosis not present

## 2011-09-21 DIAGNOSIS — I10 Essential (primary) hypertension: Secondary | ICD-10-CM | POA: Diagnosis not present

## 2011-09-21 DIAGNOSIS — Z0000 Encounter for general adult medical examination without abnormal findings: Secondary | ICD-10-CM | POA: Diagnosis not present

## 2011-09-21 DIAGNOSIS — E785 Hyperlipidemia, unspecified: Secondary | ICD-10-CM | POA: Diagnosis not present

## 2011-09-28 DIAGNOSIS — M949 Disorder of cartilage, unspecified: Secondary | ICD-10-CM | POA: Diagnosis not present

## 2011-09-28 DIAGNOSIS — F411 Generalized anxiety disorder: Secondary | ICD-10-CM | POA: Diagnosis not present

## 2011-09-28 DIAGNOSIS — E785 Hyperlipidemia, unspecified: Secondary | ICD-10-CM | POA: Diagnosis not present

## 2011-09-28 DIAGNOSIS — I10 Essential (primary) hypertension: Secondary | ICD-10-CM | POA: Diagnosis not present

## 2011-09-28 DIAGNOSIS — M899 Disorder of bone, unspecified: Secondary | ICD-10-CM | POA: Diagnosis not present

## 2011-10-21 ENCOUNTER — Ambulatory Visit (INDEPENDENT_AMBULATORY_CARE_PROVIDER_SITE_OTHER): Payer: Medicare Other | Admitting: Pulmonary Disease

## 2011-10-21 ENCOUNTER — Encounter: Payer: Self-pay | Admitting: Pulmonary Disease

## 2011-10-21 VITALS — BP 130/78 | HR 99 | Temp 97.9°F | Ht 63.0 in | Wt 231.0 lb

## 2011-10-21 DIAGNOSIS — J8410 Pulmonary fibrosis, unspecified: Secondary | ICD-10-CM

## 2011-10-21 NOTE — Progress Notes (Signed)
  Subjective:    Patient ID: Veronica Stone, female    DOB: 07-01-1931, 78 y.o.   MRN: [id number]  HPI 79/F, Ex smoker , quit '90 with RA for FU of pulmonary fibrosis.  She has a strong family history - 07-03-2022 siblings died of 'fibrosis'  Trial of Lasix >> not much benefit.  She reports dyspnea on exertion but is more limited by her arthritis. Occasional dry cough +  She smoked 30 Pyrs before quitting in '90. Worked at [employer], retired '88  She reports RA since 2008, tried humira, remicaid, now on rituxan, steroid dependent, on 5mg  prednisone [name])  Ct abd 8/10 sub plerual fibrosis  CXR 5/11bibasal scarring  PFTs 7/12 showed preserved lung volumes with FVC 77% & TLC 80%, DLCO reduced at 49%.  She desatn to 86% on second lap.  HRCT 6/12 showed diffuse chronic interstitial coarsening with scattered areas of bronchiectasis bilaterally & subpleural honeycombing consistent with UIP  ONO shows 48 min satn < 88% >O2 at At bedtime  (02/2011 ) Initially declined but started O2 in oct'12     10/21/2011 FU Dyspnea is unchanged, c/o arthritis in her foot Compliant with O2 on exertion & during sleep  Not interested in rehab  Wonders if her fibrosis is familial but not interested in research study , her sister passed away 2022/09/30 Patient denies any chest pain, cough, increased leg swelling, or fever.  Chest x-ray 10/12  showed no change in chronic lung markings Spirometry FVC 82%, does not want to perform full PFTs - don't like that clip over my nose    Review of Systems Patient denies significant dyspnea,cough, hemoptysis,  chest pain, palpitations, pedal edema, orthopnea, paroxysmal nocturnal dyspnea, lightheadedness, nausea, vomiting, abdominal or  leg pains      Objective:   Physical Exam Gen. Pleasant, obese, in no distress ENT - no lesions, no post nasal drip Neck: No JVD, no thyromegaly, no carotid bruits Lungs: no use of accessory muscles, no dullness to percussion, bibasal  rales, no rhonchi  Cardiovascular: Rhythm regular, heart sounds  normal, no murmurs or gallops, no peripheral edema Musculoskeletal: No deformities, no cyanosis or clubbing , no tremors        Assessment & Plan:

## 2011-10-21 NOTE — Assessment & Plan Note (Signed)
Stable lung function, will monitor by FVC every -12 months She is more limited by arthritis than by pulm fibrosis She is not interested in rehab program or research study for famlial IPF.

## 2011-10-21 NOTE — Patient Instructions (Addendum)
Breathing test showed lung capacity of 82% - your fibrosis seems unchanged Stay on oxygen

## 2011-11-10 ENCOUNTER — Other Ambulatory Visit (HOSPITAL_COMMUNITY): Payer: Self-pay | Admitting: Sports Medicine

## 2011-11-10 DIAGNOSIS — M79609 Pain in unspecified limb: Secondary | ICD-10-CM | POA: Diagnosis not present

## 2011-11-10 DIAGNOSIS — M79604 Pain in right leg: Secondary | ICD-10-CM

## 2011-11-22 ENCOUNTER — Encounter (HOSPITAL_COMMUNITY)
Admission: RE | Admit: 2011-11-22 | Discharge: 2011-11-22 | Disposition: A | Discharge status: Home / Self Care | Payer: Medicare Other | Source: Ambulatory Visit | Attending: Sports Medicine | Admitting: Sports Medicine

## 2011-11-22 DIAGNOSIS — M19079 Primary osteoarthritis, unspecified ankle and foot: Secondary | ICD-10-CM | POA: Insufficient documentation

## 2011-11-22 DIAGNOSIS — M79609 Pain in unspecified limb: Secondary | ICD-10-CM | POA: Diagnosis not present

## 2011-11-22 DIAGNOSIS — M79672 Pain in left foot: Secondary | ICD-10-CM

## 2011-11-22 DIAGNOSIS — M7989 Other specified soft tissue disorders: Secondary | ICD-10-CM | POA: Diagnosis not present

## 2011-11-22 MED ORDER — TECHNETIUM TC 99M MEDRONATE IV KIT
25.0000 | PACK | Freq: Once | INTRAVENOUS | Status: AC | PRN
  Administered 2011-11-22: 25 via INTRAVENOUS

## 2011-11-24 DIAGNOSIS — M069 Rheumatoid arthritis, unspecified: Secondary | ICD-10-CM | POA: Diagnosis not present

## 2011-11-29 DIAGNOSIS — H26499 Other secondary cataract, unspecified eye: Secondary | ICD-10-CM | POA: Diagnosis not present

## 2011-11-29 DIAGNOSIS — H43819 Vitreous degeneration, unspecified eye: Secondary | ICD-10-CM | POA: Diagnosis not present

## 2011-11-29 DIAGNOSIS — H3530 Unspecified macular degeneration: Secondary | ICD-10-CM | POA: Diagnosis not present

## 2011-12-07 ENCOUNTER — Encounter (HOSPITAL_COMMUNITY): Payer: Self-pay | Admitting: Neurology

## 2011-12-07 ENCOUNTER — Emergency Department (HOSPITAL_COMMUNITY)
Admission: EM | Admit: 2011-12-07 | Discharge: 2011-12-07 | Disposition: A | Discharge status: Home / Self Care | Payer: Medicare Other | Attending: Emergency Medicine | Admitting: Emergency Medicine

## 2011-12-07 DIAGNOSIS — E785 Hyperlipidemia, unspecified: Secondary | ICD-10-CM | POA: Insufficient documentation

## 2011-12-07 DIAGNOSIS — J8410 Pulmonary fibrosis, unspecified: Secondary | ICD-10-CM | POA: Diagnosis not present

## 2011-12-07 DIAGNOSIS — Z87891 Personal history of nicotine dependence: Secondary | ICD-10-CM | POA: Diagnosis not present

## 2011-12-07 DIAGNOSIS — M069 Rheumatoid arthritis, unspecified: Secondary | ICD-10-CM | POA: Diagnosis not present

## 2011-12-07 DIAGNOSIS — M545 Low back pain, unspecified: Secondary | ICD-10-CM | POA: Insufficient documentation

## 2011-12-07 DIAGNOSIS — I10 Essential (primary) hypertension: Secondary | ICD-10-CM | POA: Insufficient documentation

## 2011-12-07 DIAGNOSIS — T1490XA Injury, unspecified, initial encounter: Secondary | ICD-10-CM | POA: Diagnosis not present

## 2011-12-07 DIAGNOSIS — S39012A Strain of muscle, fascia and tendon of lower back, initial encounter: Secondary | ICD-10-CM

## 2011-12-07 DIAGNOSIS — Z9089 Acquired absence of other organs: Secondary | ICD-10-CM | POA: Diagnosis not present

## 2011-12-07 DIAGNOSIS — Z90710 Acquired absence of both cervix and uterus: Secondary | ICD-10-CM | POA: Insufficient documentation

## 2011-12-07 DIAGNOSIS — Z860100 Personal history of colon polyps, unspecified: Secondary | ICD-10-CM | POA: Insufficient documentation

## 2011-12-07 DIAGNOSIS — S335XXA Sprain of ligaments of lumbar spine, initial encounter: Secondary | ICD-10-CM | POA: Diagnosis not present

## 2011-12-07 MED ORDER — HYDROMORPHONE HCL 2 MG PO TABS: 2.0000 mg | ORAL_TABLET | ORAL | Status: DC | PRN

## 2011-12-07 MED ORDER — HYDROMORPHONE HCL PF 2 MG/ML IJ SOLN
2.0000 mg | Freq: Once | INTRAMUSCULAR | Status: AC
  Administered 2011-12-07: 2 mg via INTRAMUSCULAR
  Filled 2011-12-07: qty 1

## 2011-12-07 NOTE — ED Provider Notes (Signed)
History   This chart was scribed for Veronica Guppy, MD by Charolett Bumpers . The patient was seen in room TR05C/TR05C. Patient's care was started at 1708.    CSN: [id number]  Arrival date & time 12/07/11  1707   First MD Initiated Contact with Patient 12/07/11 1708      Chief Complaint  Patient presents with  . Fall    (Consider location/radiation/quality/duration/timing/severity/associated sxs/prior treatment) HPI Thomasa Heidler is a 78 y.o. female who presents to the Emergency Department complaining of constant, moderate bilaterally lower back pain after a fall that occurred PTA. Pt states that she fell outside on the sidewalk, after stepping in a small hole and fell into garbage cans. Pt denies any neck pain or headaches. Pt denies any other complaints of pain at this time. Pt reports a h/o arthritis. Pt reports that that she was able to ambulate independently. Pt states that her symptoms are aggravated with movement. Pt denies any h/o diabetes, liver or kidney problems. Pt reports a h/o HTN, Hyperlipemia, and pulmonary fibrosis.   Past Medical History  Diagnosis Date  . Colon, diverticulosis Nov 2007  . Hyperlipemia   . Hypertension   . Irregular heart rate   . Rheumatoid arthritis   . IBS (irritable bowel syndrome)   . Personal history of colonic polyps 04/10/2006    hyperplastic  . Pulmonary fibrosis     Past Surgical History  Procedure Date  . Cholecystectomy   . Appendectomy   . Cystectomy     left breast  . Abdominal hysterectomy     Family History  Problem Relation Age of Onset  . Heart disease Brother   . Heart disease Sister   . Liver disease Daughter   . Diabetes Brother   . Rheum arthritis Mother     History  Substance Use Topics  . Smoking status: Former Smoker -- 1.0 packs/day for 30 years    Types: Cigarettes    Quit date: 05/16/1988  . Smokeless tobacco: Never Used  . Alcohol Use: No    OB History    Grav Para Term Preterm  Abortions TAB SAB Ect Mult Living                  Review of Systems  Constitutional: Negative for fever and chills.  HENT: Negative for neck pain.   Respiratory: Negative for shortness of breath.   Gastrointestinal: Negative for nausea and vomiting.  Musculoskeletal: Positive for back pain.  Neurological: Negative for weakness and headaches.    Allergies  Statins  Home Medications   Current Outpatient Rx  Name Route Sig Dispense Refill  . AMLODIPINE BESYLATE 5 MG PO TABS Oral Take 2.5 mg by mouth daily.     . DEXLANSOPRAZOLE 60 MG PO CPDR Oral Take 60 mg by mouth daily as needed. For acid regflux    . DIAZEPAM 5 MG PO TABS Oral Take 5 mg by mouth at bedtime as needed. For anxiety/sleep    . ESTROGENS CONJUGATED 0.625 MG PO TABS Oral Take 0.625 mg by mouth daily.      Marland Kitchen FOLIC ACID 1 MG PO TABS Oral Take 1 mg by mouth daily.      . FUROSEMIDE 20 MG PO TABS Oral Take 1 tablet by mouth Once daily as needed. For swelling    . HYDROCODONE-ACETAMINOPHEN 5-325 MG PO TABS Oral Take 1 tablet by mouth Twice daily as needed. For pain    . LORATADINE 10 MG PO TABS Oral Take  10 mg by mouth daily.    Marland Kitchen POLYETHYL GLYCOL-PROPYL GLYCOL 0.4-0.3 % OP SOLN Ophthalmic Apply 1 drop to eye daily as needed. For dry eyes    . PREDNISONE 10 MG PO TABS Oral Take 10 mg by mouth daily.    Marland Kitchen RITUXAN IV Intravenous Inject into the vein. Use as directed-Next dose due in August 2013    . VALSARTAN 160 MG PO TABS Oral Take 160 mg by mouth daily.        BP 137/92  Pulse 96  Temp 97.9 F (36.6 C) (Oral)  Resp 16  SpO2 97%  Physical Exam  Nursing note and vitals reviewed. Constitutional: She is oriented to person, place, and time. She appears well-developed and well-nourished. No distress.  HENT:  Head: Normocephalic and atraumatic.  Eyes: EOM are normal.  Neck: Normal range of motion. Neck supple. No tracheal deviation present.  Cardiovascular: Normal rate, regular rhythm and normal heart sounds.     Pulmonary/Chest: Effort normal and breath sounds normal. No respiratory distress. She has no wheezes. She has no rales.  Musculoskeletal: Normal range of motion. She exhibits tenderness.       No cervical, thoracic, or lumbar spinal tenderness. Tenderness to perispinal lumbar bilaterally.  Neurological: She is alert and oriented to person, place, and time.  Skin: Skin is warm and dry.  Psychiatric: She has a normal mood and affect. Her behavior is normal.    ED Course  Procedures (including critical care time)  DIAGNOSTIC STUDIES: Oxygen Saturation is 97% on 2L [place], normal by my interpretation.    COORDINATION OF CARE:  17:57-Discussed planned course of treatment with the patient including pain medication, who is agreeable at this time.   18:00-Medication Orders: Hydromorphone (Dilaudid) injection 2 mg-once.   Labs Reviewed - No data to display No results found.   No diagnosis found.  Stepped in hole.  Fell onto grass and twisted back. No head injury. No neck pain or ha.  Has only back pain.   ttp in paraspinal area. Not on spinal column. No xr indicated  MDM  Back strain    I personally performed the services described in this documentation, which was scribed in my presence. The recorded information has been reviewed and considered.      Veronica Guppy, MD 12/07/11 [phone]

## 2011-12-07 NOTE — ED Notes (Signed)
Per EMS- Pt was outside stepped in small hole and fell into garbage cans. Pt twisted torso and now having back pain. No visible marks from fall. Pt able to ambulate independently. A x 4. BP 118/76, HR 120 ST, 99% 2 L

## 2011-12-09 ENCOUNTER — Emergency Department (HOSPITAL_COMMUNITY): Payer: Medicare Other

## 2011-12-09 ENCOUNTER — Emergency Department (HOSPITAL_COMMUNITY)
Admission: EM | Admit: 2011-12-09 | Discharge: 2011-12-09 | Disposition: A | Discharge status: Home / Self Care | Payer: Medicare Other | Attending: Emergency Medicine | Admitting: Emergency Medicine

## 2011-12-09 ENCOUNTER — Encounter (HOSPITAL_COMMUNITY): Payer: Self-pay

## 2011-12-09 DIAGNOSIS — K589 Irritable bowel syndrome without diarrhea: Secondary | ICD-10-CM | POA: Insufficient documentation

## 2011-12-09 DIAGNOSIS — I10 Essential (primary) hypertension: Secondary | ICD-10-CM | POA: Diagnosis not present

## 2011-12-09 DIAGNOSIS — Z9181 History of falling: Secondary | ICD-10-CM | POA: Diagnosis not present

## 2011-12-09 DIAGNOSIS — E785 Hyperlipidemia, unspecified: Secondary | ICD-10-CM | POA: Diagnosis not present

## 2011-12-09 DIAGNOSIS — W19XXXA Unspecified fall, initial encounter: Secondary | ICD-10-CM | POA: Insufficient documentation

## 2011-12-09 DIAGNOSIS — M51379 Other intervertebral disc degeneration, lumbosacral region without mention of lumbar back pain or lower extremity pain: Secondary | ICD-10-CM | POA: Insufficient documentation

## 2011-12-09 DIAGNOSIS — M069 Rheumatoid arthritis, unspecified: Secondary | ICD-10-CM | POA: Diagnosis not present

## 2011-12-09 DIAGNOSIS — Z87891 Personal history of nicotine dependence: Secondary | ICD-10-CM | POA: Insufficient documentation

## 2011-12-09 DIAGNOSIS — Z79899 Other long term (current) drug therapy: Secondary | ICD-10-CM | POA: Insufficient documentation

## 2011-12-09 DIAGNOSIS — M5137 Other intervertebral disc degeneration, lumbosacral region: Secondary | ICD-10-CM | POA: Diagnosis not present

## 2011-12-09 DIAGNOSIS — S32009A Unspecified fracture of unspecified lumbar vertebra, initial encounter for closed fracture: Secondary | ICD-10-CM | POA: Diagnosis not present

## 2011-12-09 DIAGNOSIS — M4850XA Collapsed vertebra, not elsewhere classified, site unspecified, initial encounter for fracture: Secondary | ICD-10-CM

## 2011-12-09 DIAGNOSIS — IMO0002 Reserved for concepts with insufficient information to code with codable children: Secondary | ICD-10-CM | POA: Diagnosis not present

## 2011-12-09 DIAGNOSIS — R6889 Other general symptoms and signs: Secondary | ICD-10-CM | POA: Diagnosis not present

## 2011-12-09 DIAGNOSIS — M519 Unspecified thoracic, thoracolumbar and lumbosacral intervertebral disc disorder: Secondary | ICD-10-CM | POA: Diagnosis not present

## 2011-12-09 DIAGNOSIS — M8448XA Pathological fracture, other site, initial encounter for fracture: Secondary | ICD-10-CM | POA: Diagnosis not present

## 2011-12-09 DIAGNOSIS — M545 Low back pain: Secondary | ICD-10-CM | POA: Diagnosis not present

## 2011-12-09 DIAGNOSIS — S298XXA Other specified injuries of thorax, initial encounter: Secondary | ICD-10-CM | POA: Diagnosis not present

## 2011-12-09 MED ORDER — HYDROMORPHONE HCL PF 2 MG/ML IJ SOLN
2.0000 mg | Freq: Once | INTRAMUSCULAR | Status: AC
  Administered 2011-12-09: 2 mg via INTRAMUSCULAR
  Filled 2011-12-09: qty 1

## 2011-12-09 MED ORDER — ONDANSETRON HCL 8 MG PO TABS: 8.0000 mg | ORAL_TABLET | Freq: Three times a day (TID) | ORAL | Status: AC | PRN

## 2011-12-09 MED ORDER — ONDANSETRON 4 MG PO TBDP
8.0000 mg | ORAL_TABLET | Freq: Once | ORAL | Status: AC
  Administered 2011-12-09: 8 mg via ORAL
  Filled 2011-12-09: qty 2

## 2011-12-09 NOTE — ED Notes (Signed)
Pt. Veronica Stone Wednesday and was brought to [place] an seen by an ED MD.  Pt. Was diagnosed with back strain and sent home with Dilaudid.  Pt. Is unable to take the Dilaudid because it is making her nauseated and she vomits.  Pt. Has not taken any pain medication since yesterday and is having increased pain which inhibits her to move.  No new injuries noted since her fall

## 2011-12-09 NOTE — ED Notes (Signed)
Pt laying in bed, daughters at bedside. Pt c/o back pain in center and lower back. Pt reports she fell on Wednesday and has been in pain ever since. No bruising on back seen, no obvious deformity. Pt reports she has arthritis and it has caused her a lot of pain.

## 2011-12-09 NOTE — ED Provider Notes (Signed)
History     CSN: [id number]  Arrival date & time 12/09/11  1004   First MD Initiated Contact with Patient 12/09/11 1012      Chief Complaint  Patient presents with  . Back Pain    (Consider location/radiation/quality/duration/timing/severity/associated sxs/prior treatment) HPI Comments: Veronica Stone is a 78 y.o. Female who is here for second evaluation of back pain, after a fall, 2 days ago. Initial evaluation. Here was with imaging. She was prescribed, Dilaudid orally but it caused vomiting. She has not had a Dilaudid, since last evening. Her pain is in the bilateral lower back without radiation. She denies change in bowel or urinary habits. There's been no fever. No diarrhea. Movement aggravates the pain. Nothing makes it better. She has no prior back problems, but has chronic arthritis, for which she takes daily, hydrocodone.  She did not eat this morning because of anorexia.  Patient is a 78 y.o. female presenting with back pain. The history is provided by the patient.  Back Pain     Past Medical History  Diagnosis Date  . Colon, diverticulosis Nov 2007  . Hyperlipemia   . Hypertension   . Irregular heart rate   . Rheumatoid arthritis   . IBS (irritable bowel syndrome)   . Personal history of colonic polyps 04/10/2006    hyperplastic  . Pulmonary fibrosis     Past Surgical History  Procedure Date  . Cholecystectomy   . Appendectomy   . Cystectomy     left breast  . Abdominal hysterectomy     Family History  Problem Relation Age of Onset  . Heart disease Brother   . Heart disease Sister   . Liver disease Daughter   . Diabetes Brother   . Rheum arthritis Mother     History  Substance Use Topics  . Smoking status: Former Smoker -- 1.0 packs/day for 30 years    Types: Cigarettes    Quit date: 05/16/1988  . Smokeless tobacco: Never Used  . Alcohol Use: No    OB History    Grav Para Term Preterm Abortions TAB SAB Ect Mult Living                   Review of Systems  Musculoskeletal: Positive for back pain.  All other systems reviewed and are negative.    Allergies  Dilaudid and Statins  Home Medications   Current Outpatient Rx  Name Route Sig Dispense Refill  . AMLODIPINE BESYLATE 5 MG PO TABS Oral Take 2.5 mg by mouth daily.     . DEXLANSOPRAZOLE 60 MG PO CPDR Oral Take 60 mg by mouth daily as needed. For acid regflux    . DIAZEPAM 5 MG PO TABS Oral Take 5 mg by mouth at bedtime as needed. For anxiety/sleep    . ESTROGENS CONJUGATED 0.625 MG PO TABS Oral Take 0.625 mg by mouth daily.      Marland Kitchen FOLIC ACID 1 MG PO TABS Oral Take 1 mg by mouth daily.      . FUROSEMIDE 20 MG PO TABS Oral Take 1 tablet by mouth Once daily as needed. For swelling    . HYDROCODONE-ACETAMINOPHEN 5-325 MG PO TABS Oral Take 1 tablet by mouth Twice daily as needed. For pain    . LORATADINE 10 MG PO TABS Oral Take 10 mg by mouth daily.    Marland Kitchen POLYETHYL GLYCOL-PROPYL GLYCOL 0.4-0.3 % OP SOLN Ophthalmic Apply 1 drop to eye daily as needed. For dry eyes    .  PREDNISONE 10 MG PO TABS Oral Take 10 mg by mouth daily.    Marland Kitchen VALSARTAN 160 MG PO TABS Oral Take 160 mg by mouth daily.      Marland Kitchen ONDANSETRON HCL 8 MG PO TABS Oral Take 1 tablet (8 mg total) by mouth every 8 (eight) hours as needed for nausea. 20 tablet 0  . RITUXAN IV Intravenous Inject into the vein. Use as directed-Next dose due in August 2013      BP 118/67  Pulse 88  Temp 98.5 F (36.9 C) (Oral)  Resp 18  SpO2 97%  Physical Exam  Nursing note and vitals reviewed. Constitutional: She is oriented to person, place, and time. She appears well-developed and well-nourished.  HENT:  Head: Normocephalic and atraumatic.  Eyes: Conjunctivae and EOM are normal. Pupils are equal, round, and reactive to light.  Neck: Normal range of motion and phonation normal. Neck supple.  Cardiovascular: Normal rate, regular rhythm and intact distal pulses.   Pulmonary/Chest: Effort normal and breath sounds normal.  She exhibits no tenderness.  Abdominal: Soft. She exhibits no distension. There is no tenderness. There is no guarding.  Musculoskeletal:       Decreased back motions secondary to lumbar pain. Palpable tenderness of lumbar and left paraspinal regions. Negative straight leg raising bilaterally.  Neurological: She is alert and oriented to person, place, and time. She has normal strength. She exhibits normal muscle tone.  Skin: Skin is warm and dry.  Psychiatric: She has a normal mood and affect. Her behavior is normal. Judgment and thought content normal.    ED Course  Procedures (including critical care time)  Emergency department treatment : Dilaudid IM, Zofran, orally Imaging ordered  Reevaluation: Her pain is resolved, and she had no nausea, after the treatment. Findings discussed with patient.   Labs Reviewed - No data to display Dg Ribs Unilateral W/chest Left  12/09/2011  *RADIOLOGY REPORT*  Clinical Data: Status post fall 3 days ago.  LEFT RIBS AND CHEST - 3+ VIEW  Comparison: PA and lateral chest 02/24/2011 and CT chest 11/03/2010.  Findings: The lungs are emphysematous with peripheral fibrosis, worse on the left.  No new airspace disease or effusion.  No pneumothorax.  No rib fracture.  IMPRESSION: No acute finding.  Original Report Authenticated By: Bernadene Bell. Maricela Curet, M.D.   Dg Thoracic Spine 4v  12/09/2011  *RADIOLOGY REPORT*  Clinical Data: History of fall.  THORACIC SPINE - 4+ VIEW  Comparison: CT chest 11/03/2010 and PA and lateral chest 10/11.  Findings: Vertebral body height and alignment are maintained.  No notable degenerative change.  IMPRESSION: No acute finding.  Original Report Authenticated By: Bernadene Bell. D'ALESSIO, M.D.   Dg Lumbar Spine Complete  12/09/2011  *RADIOLOGY REPORT*  Clinical Data: Status post fall 3 days ago.  Pain.  LUMBAR SPINE - COMPLETE 4+ VIEW  Comparison: CT abdomen and pelvis 11/06/2008.  Findings: The patient has a mild superior endplate  compression fracture of L1 which is new since the prior CT. Vertebral body height is otherwise normal.  Marked loss of disc space height is seen at L4-5 and L5-S1.  IMPRESSION:  1.  Mild superior endplate compression fracture of L1 is new since the prior CT but age indeterminate. 2.  Degenerative disease L4-5 and L5-S1  Original Report Authenticated By: Bernadene Bell. D'ALESSIO, M.D.     1. Compression fracture of vertebra       MDM  Multifactorial, back pain, with likely, acute compression fracture, mild. Doubt spinal  myelopathy. Doubt occult infection. Patient stable for discharge.    Plan: Home Medications- Zofran, increase Norco to q 4 hour prn; Home Treatments- heat treatments; Recommended follow up- PCP prn       Flint Melter, MD 12/09/11 1407

## 2011-12-19 DIAGNOSIS — M549 Dorsalgia, unspecified: Secondary | ICD-10-CM | POA: Diagnosis not present

## 2011-12-19 DIAGNOSIS — S32009A Unspecified fracture of unspecified lumbar vertebra, initial encounter for closed fracture: Secondary | ICD-10-CM | POA: Diagnosis not present

## 2011-12-21 ENCOUNTER — Encounter (HOSPITAL_COMMUNITY): Payer: Medicare Other

## 2012-01-19 DIAGNOSIS — S32009A Unspecified fracture of unspecified lumbar vertebra, initial encounter for closed fracture: Secondary | ICD-10-CM | POA: Diagnosis not present

## 2012-01-19 DIAGNOSIS — M545 Low back pain: Secondary | ICD-10-CM | POA: Diagnosis not present

## 2012-02-01 ENCOUNTER — Encounter (HOSPITAL_COMMUNITY)
Admission: RE | Admit: 2012-02-01 | Discharge: 2012-02-01 | Disposition: A | Discharge status: Home / Self Care | Payer: Medicare Other | Source: Ambulatory Visit | Attending: Rheumatology | Admitting: Rheumatology

## 2012-02-01 DIAGNOSIS — M069 Rheumatoid arthritis, unspecified: Secondary | ICD-10-CM | POA: Insufficient documentation

## 2012-02-01 MED ORDER — METHYLPREDNISOLONE SODIUM SUCC 125 MG IJ SOLR
100.0000 mg | Freq: Once | INTRAMUSCULAR | Status: AC
  Administered 2012-02-01: 100 mg via INTRAVENOUS

## 2012-02-01 MED ORDER — SODIUM CHLORIDE 0.9 % IV SOLN
INTRAVENOUS | Status: DC
  Administered 2012-02-01: 250 mL via INTRAVENOUS

## 2012-02-01 MED ORDER — METHYLPREDNISOLONE SODIUM SUCC 125 MG IJ SOLR
125.0000 mg | Freq: Once | INTRAMUSCULAR | Status: DC
  Filled 2012-02-01: qty 2

## 2012-02-01 MED ORDER — SODIUM CHLORIDE 0.9 % IV SOLN
1000.0000 mg | Freq: Once | INTRAVENOUS | Status: AC
  Administered 2012-02-01: 1000 mg via INTRAVENOUS
  Filled 2012-02-01: qty 100

## 2012-02-01 NOTE — Progress Notes (Signed)
02/01/2012 patient states she took claritin 10 mgm last pm at home

## 2012-02-08 ENCOUNTER — Encounter: Payer: Self-pay | Admitting: Gastroenterology

## 2012-02-14 ENCOUNTER — Other Ambulatory Visit (HOSPITAL_COMMUNITY): Payer: Self-pay | Admitting: *Deleted

## 2012-02-15 ENCOUNTER — Encounter (HOSPITAL_COMMUNITY)
Admission: RE | Admit: 2012-02-15 | Discharge: 2012-02-15 | Disposition: A | Discharge status: Home / Self Care | Payer: Medicare Other | Source: Ambulatory Visit | Attending: Rheumatology | Admitting: Rheumatology

## 2012-02-15 DIAGNOSIS — M069 Rheumatoid arthritis, unspecified: Secondary | ICD-10-CM | POA: Insufficient documentation

## 2012-02-15 MED ORDER — SODIUM CHLORIDE 0.9 % IV SOLN
1000.0000 mg | Freq: Once | INTRAVENOUS | Status: AC
  Administered 2012-02-15: 1000 mg via INTRAVENOUS
  Filled 2012-02-15: qty 100

## 2012-02-15 MED ORDER — METHYLPREDNISOLONE SODIUM SUCC 125 MG IJ SOLR
100.0000 mg | Freq: Once | INTRAMUSCULAR | Status: AC
  Administered 2012-02-15: 100 mg via INTRAVENOUS

## 2012-02-15 MED ORDER — METHYLPREDNISOLONE SODIUM SUCC 125 MG IJ SOLR
INTRAMUSCULAR | Status: AC
  Administered 2012-02-15: 100 mg via INTRAVENOUS
  Filled 2012-02-15: qty 2

## 2012-02-15 MED ORDER — SODIUM CHLORIDE 0.9 % IV SOLN
INTRAVENOUS | Status: DC
  Administered 2012-02-15: 10:00:00 via INTRAVENOUS

## 2012-02-20 ENCOUNTER — Ambulatory Visit (INDEPENDENT_AMBULATORY_CARE_PROVIDER_SITE_OTHER)
Admission: RE | Admit: 2012-02-20 | Discharge: 2012-02-20 | Disposition: A | Discharge status: Home / Self Care | Payer: Medicare Other | Source: Ambulatory Visit | Attending: Adult Health | Admitting: Adult Health

## 2012-02-20 ENCOUNTER — Encounter: Payer: Self-pay | Admitting: Adult Health

## 2012-02-20 ENCOUNTER — Ambulatory Visit (INDEPENDENT_AMBULATORY_CARE_PROVIDER_SITE_OTHER): Payer: Medicare Other | Admitting: Adult Health

## 2012-02-20 VITALS — BP 122/98 | HR 88 | Temp 97.3°F | Ht 63.0 in | Wt 218.2 lb

## 2012-02-20 DIAGNOSIS — J8410 Pulmonary fibrosis, unspecified: Secondary | ICD-10-CM

## 2012-02-20 DIAGNOSIS — Z23 Encounter for immunization: Secondary | ICD-10-CM | POA: Diagnosis not present

## 2012-02-20 NOTE — Assessment & Plan Note (Signed)
Compensated on present regimen  Check xray today  Flu shot  No changes

## 2012-02-20 NOTE — Progress Notes (Signed)
Subjective:    Patient ID: Veronica Stone, female    DOB: 1932/01/25, 78 y.o.   MRN: [id number]  HPI  79/F, Ex smoker , quit '90 with RA for FU of pulmonary fibrosis.  She has a strong family history - 07/22/2022 siblings died of 'fibrosis'  Trial of Lasix >> not much benefit.  She reports dyspnea on exertion but is more limited by her arthritis. Occasional dry cough +  She smoked 30 Pyrs before quitting in '90. Worked at [employer], retired '88  She reports RA since 2008, tried humira, remicaid, now on rituxan, steroid dependent, on 5mg  prednisone [name])  Ct abd 8/10 sub plerual fibrosis  CXR 5/11bibasal scarring  PFTs 7/12 showed preserved lung volumes with FVC 77% & TLC 80%, DLCO reduced at 49%.  She desatn to 86% on second lap.  HRCT 6/12 showed diffuse chronic interstitial coarsening with scattered areas of bronchiectasis bilaterally & subpleural honeycombing consistent with UIP  ONO shows 48 min satn < 88% >O2 at At bedtime  (02/2011 ) Initially declined but started O2 in oct'12     10/21/2011 FU Dyspnea is unchanged, c/o arthritis in her foot Compliant with O2 on exertion & during sleep  Not interested in rehab  Wonders if her fibrosis is familial but not interested in research study , her sister passed away Oct 19, 2022 Patient denies any chest pain, cough, increased leg swelling, or fever.  Chest x-ray 10/12  showed no change in chronic lung markings Spirometry FVC 82%, does not want to perform full PFTs - don't like that clip over my nose >>no changes   02/20/2012 Follow up Pulmonary Fibrosis  Returns for 4 month follow up for fibrosis  Remains on Prednisone 10mg  daily  No change in DOE or activity level .  Wearing O2 with activity and At bedtime   Needs flu shot today .  No hemoptysis or chest pain.   Review of Systems Constitutional:   No  weight loss, night sweats,  Fevers, chills,  +fatigue, or  lassitude.  HEENT:   No headaches,  Difficulty swallowing,   Tooth/dental problems, or  Sore throat,                No sneezing, itching, ear ache, nasal congestion, post nasal drip,   CV:  No chest pain,  Orthopnea, PND,  , anasarca, dizziness, palpitations, syncope.   GI  No heartburn, indigestion, abdominal pain, nausea, vomiting, diarrhea, change in bowel habits, loss of appetite, bloody stools.   Resp:  .  No excess mucus, no productive cough,    No coughing up of blood.  No change in color of mucus.  No wheezing.  No chest wall deformity  Skin: no rash or lesions.  GU: no dysuria, change in color of urine, no urgency or frequency.  No flank pain, no hematuria   MS:  No joint pain or swelling.  No decreased range of motion.     Psych:  No change in mood or affect. No depression or anxiety.  No memory loss.          Objective:   Physical Exam  GEN: A/Ox3; pleasant , NAD, morbidly obese   HEENT:  [place]/AT,  EACs-clear, TMs-wnl, NOSE-clear, THROAT-clear, no lesions, no postnasal drip or exudate noted.   NECK:  Supple w/ fair ROM; no JVD; normal carotid impulses w/o bruits; no thyromegaly or nodules palpated; no lymphadenopathy.  RESP  Coarse BS w/ faint basilar crackles no accessory muscle use, no dullness to  percussion  CARD:  RRR, no m/r/g  , no peripheral edema, pulses intact, no cyanosis or clubbing.  GI:   Soft & nt; nml bowel sounds; no organomegaly or masses detected.  Musco: Warm bil, no deformities or joint swelling noted.   Neuro: alert, no focal deficits noted.    Skin: Warm, no lesions or rashes        Assessment & Plan:

## 2012-02-20 NOTE — Patient Instructions (Addendum)
Flu shot today .  Continue on current regimen . Follow up 4 months and As needed  With Dr. Vassie Loll

## 2012-02-21 NOTE — Progress Notes (Signed)
Quick Note:  Called spoke with patient, advised of cxr results / recs as stated by TP. Pt verbalized her understanding and denied any questions. ______ 

## 2012-03-26 DIAGNOSIS — E785 Hyperlipidemia, unspecified: Secondary | ICD-10-CM | POA: Diagnosis not present

## 2012-03-26 DIAGNOSIS — M949 Disorder of cartilage, unspecified: Secondary | ICD-10-CM | POA: Diagnosis not present

## 2012-03-26 DIAGNOSIS — M899 Disorder of bone, unspecified: Secondary | ICD-10-CM | POA: Diagnosis not present

## 2012-03-26 DIAGNOSIS — I10 Essential (primary) hypertension: Secondary | ICD-10-CM | POA: Diagnosis not present

## 2012-03-27 DIAGNOSIS — M069 Rheumatoid arthritis, unspecified: Secondary | ICD-10-CM | POA: Diagnosis not present

## 2012-04-02 DIAGNOSIS — K219 Gastro-esophageal reflux disease without esophagitis: Secondary | ICD-10-CM | POA: Diagnosis not present

## 2012-04-02 DIAGNOSIS — F411 Generalized anxiety disorder: Secondary | ICD-10-CM | POA: Diagnosis not present

## 2012-04-02 DIAGNOSIS — I10 Essential (primary) hypertension: Secondary | ICD-10-CM | POA: Diagnosis not present

## 2012-04-02 DIAGNOSIS — E785 Hyperlipidemia, unspecified: Secondary | ICD-10-CM | POA: Diagnosis not present

## 2012-06-20 ENCOUNTER — Encounter: Payer: Self-pay | Admitting: Pulmonary Disease

## 2012-06-20 ENCOUNTER — Ambulatory Visit (INDEPENDENT_AMBULATORY_CARE_PROVIDER_SITE_OTHER): Payer: Medicare Other | Admitting: Pulmonary Disease

## 2012-06-20 VITALS — BP 130/82 | HR 106 | Temp 97.4°F | Ht 63.0 in | Wt 221.0 lb

## 2012-06-20 DIAGNOSIS — J8410 Pulmonary fibrosis, unspecified: Secondary | ICD-10-CM

## 2012-06-20 NOTE — Progress Notes (Signed)
  Subjective:    Patient ID: Veronica Stone, female    DOB: Apr 09, 1932, 79 y.o.   MRN: [id number]  HPI  79/F, Ex smoker , quit '90 with RA for FU of pulmonary fibrosis.  She has a strong family history - 2022/07/03 siblings died of 'fibrosis'   She reports dyspnea on exertion but is more limited by her arthritis. Occasional dry cough +  She smoked 30 Pyrs before quitting in '90. Worked at [employer], retired '88  She reports RA since 2008, tried humira, remicaid, now on rituxan, steroid dependent, on 5mg  prednisone [name])  Ct abd 8/10 sub plerual fibrosis  CXR 5/11bibasal scarring  PFTs 7/12 showed preserved lung volumes with FVC 77% & TLC 80%, DLCO reduced at 49%.  She desatn to 86% on second lap.  HRCT 6/12 showed diffuse chronic interstitial coarsening with scattered areas of bronchiectasis bilaterally & subpleural honeycombing consistent with UIP  ONO shows 48 min satn < 88% >O2 at At bedtime  (02/2011 ) Initially declined but started O2 in oct'12  Trial of Lasix >> not much benefit.     06/20/2012 79m FU Pt reports breathing has been fine. Has slight cough and wheezing. no chest tx. Pt states she believes she had a mild case of the flu couple weeks ago.  Dyspnea is unchanged, c/o arthritis in her foot Compliant with O2 on exertion & during sleep  Not interested in rehab  Wonders if her fibrosis is familial but not interested in research study , her sister passed away Sep 30, 2022 Patient denies any chest pain, cough, increased leg swelling, or fever.  Chest x-ray 10/32  showed no change in chronic lung markings 6/13 FVC 82%, does not want to perform full PFTs - don't like that clip over my nose FVC 84% -preserved -   Remains on Prednisone 10mg  daily - steroid sparig agents havenot worked   Review of Systems neg for any significant sore throat, dysphagia, itching, sneezing, nasal congestion or excess/ purulent secretions, fever, chills, sweats, unintended wt loss, pleuritic or exertional  cp, hempoptysis, orthopnea pnd or change in chronic leg swelling. Also denies presyncope, palpitations, heartburn, abdominal pain, nausea, vomiting, diarrhea or change in bowel or urinary habits, dysuria,hematuria, rash, arthralgias, visual complaints, headache, numbness weakness or ataxia.     Objective:   Physical Exam  Gen. Pleasant, obese, in no distress ENT - no lesions, no post nasal drip Neck: No JVD, no thyromegaly, no carotid bruits Lungs: no use of accessory muscles, no dullness to percussion, bibasal 1/3 corase dry rales no rhonchi  Cardiovascular: Rhythm regular, heart sounds  normal, no murmurs or gallops, no peripheral edema Musculoskeletal: No deformities, no cyanosis or clubbing , no tremors         Assessment & Plan:

## 2012-06-20 NOTE — Patient Instructions (Signed)
Lung function is preserved You will benefit from pulmonary rehab

## 2012-06-20 NOTE — Assessment & Plan Note (Signed)
Exam & CT abdomen from 5/11 are consistent with pulmonary fibrosis ? IPF vs rheumatoid lung >>O2 since 02/24/2011  >>ONO shows 48 min satn < 88% >O2 at At bedtime  (02/2011 )   Lung function is stable & she is clinically unchanged Ct O2 on exertion & sleep We discussed long term side effects of steroids - but seems like she has failed other steroid sparing agents

## 2012-08-21 ENCOUNTER — Other Ambulatory Visit (HOSPITAL_COMMUNITY): Payer: Self-pay | Admitting: *Deleted

## 2012-08-22 ENCOUNTER — Encounter (HOSPITAL_COMMUNITY)
Admission: RE | Admit: 2012-08-22 | Discharge: 2012-08-22 | Disposition: A | Discharge status: Home / Self Care | Payer: Medicare Other | Source: Ambulatory Visit | Attending: Rheumatology | Admitting: Rheumatology

## 2012-08-22 DIAGNOSIS — M069 Rheumatoid arthritis, unspecified: Secondary | ICD-10-CM | POA: Diagnosis not present

## 2012-08-22 MED ORDER — SODIUM CHLORIDE 0.9 % IV SOLN
1000.0000 mg | INTRAVENOUS | Status: DC
  Administered 2012-08-22: 1000 mg via INTRAVENOUS
  Filled 2012-08-22: qty 100

## 2012-08-22 MED ORDER — METHYLPREDNISOLONE SODIUM SUCC 125 MG IJ SOLR
100.0000 mg | INTRAMUSCULAR | Status: DC
  Administered 2012-08-22: 100 mg via INTRAVENOUS

## 2012-08-22 MED ORDER — SODIUM CHLORIDE 0.9 % IV SOLN
INTRAVENOUS | Status: DC
  Administered 2012-08-22: 10:00:00 via INTRAVENOUS

## 2012-08-22 MED ORDER — METHYLPREDNISOLONE SODIUM SUCC 125 MG IJ SOLR
INTRAMUSCULAR | Status: AC
  Filled 2012-08-22: qty 2

## 2012-09-05 ENCOUNTER — Encounter (HOSPITAL_COMMUNITY)
Admission: RE | Admit: 2012-09-05 | Discharge: 2012-09-05 | Disposition: A | Discharge status: Home / Self Care | Payer: Medicare Other | Source: Ambulatory Visit | Attending: Rheumatology | Admitting: Rheumatology

## 2012-09-05 MED ORDER — SODIUM CHLORIDE 0.9 % IV SOLN
INTRAVENOUS | Status: AC
  Administered 2012-09-05: 09:00:00 via INTRAVENOUS

## 2012-09-05 MED ORDER — SODIUM CHLORIDE 0.9 % IV SOLN
1000.0000 mg | INTRAVENOUS | Status: AC
  Administered 2012-09-05: 1000 mg via INTRAVENOUS
  Filled 2012-09-05: qty 100

## 2012-09-05 MED ORDER — METHYLPREDNISOLONE SODIUM SUCC 125 MG IJ SOLR
100.0000 mg | INTRAMUSCULAR | Status: AC
  Administered 2012-09-05: 100 mg via INTRAVENOUS

## 2012-09-25 DIAGNOSIS — I10 Essential (primary) hypertension: Secondary | ICD-10-CM | POA: Diagnosis not present

## 2012-09-25 DIAGNOSIS — E785 Hyperlipidemia, unspecified: Secondary | ICD-10-CM | POA: Diagnosis not present

## 2012-09-25 DIAGNOSIS — N390 Urinary tract infection, site not specified: Secondary | ICD-10-CM | POA: Diagnosis not present

## 2012-09-25 DIAGNOSIS — M899 Disorder of bone, unspecified: Secondary | ICD-10-CM | POA: Diagnosis not present

## 2012-09-25 DIAGNOSIS — Z0000 Encounter for general adult medical examination without abnormal findings: Secondary | ICD-10-CM | POA: Diagnosis not present

## 2012-09-25 DIAGNOSIS — Z1331 Encounter for screening for depression: Secondary | ICD-10-CM | POA: Diagnosis not present

## 2012-09-25 DIAGNOSIS — M949 Disorder of cartilage, unspecified: Secondary | ICD-10-CM | POA: Diagnosis not present

## 2012-10-02 DIAGNOSIS — G4700 Insomnia, unspecified: Secondary | ICD-10-CM | POA: Diagnosis not present

## 2012-10-02 DIAGNOSIS — M069 Rheumatoid arthritis, unspecified: Secondary | ICD-10-CM | POA: Diagnosis not present

## 2012-10-02 DIAGNOSIS — I10 Essential (primary) hypertension: Secondary | ICD-10-CM | POA: Diagnosis not present

## 2012-10-02 DIAGNOSIS — K219 Gastro-esophageal reflux disease without esophagitis: Secondary | ICD-10-CM | POA: Diagnosis not present

## 2012-10-02 DIAGNOSIS — H34219 Partial retinal artery occlusion, unspecified eye: Secondary | ICD-10-CM | POA: Diagnosis not present

## 2012-10-02 DIAGNOSIS — E785 Hyperlipidemia, unspecified: Secondary | ICD-10-CM | POA: Diagnosis not present

## 2012-10-17 DIAGNOSIS — Z1231 Encounter for screening mammogram for malignant neoplasm of breast: Secondary | ICD-10-CM | POA: Diagnosis not present

## 2012-10-22 ENCOUNTER — Other Ambulatory Visit: Payer: Self-pay | Admitting: Gastroenterology

## 2012-10-22 NOTE — Telephone Encounter (Signed)
Patient will need an office visit for further refills  

## 2012-10-31 DIAGNOSIS — H3530 Unspecified macular degeneration: Secondary | ICD-10-CM | POA: Diagnosis not present

## 2012-10-31 DIAGNOSIS — H26499 Other secondary cataract, unspecified eye: Secondary | ICD-10-CM | POA: Diagnosis not present

## 2012-12-17 ENCOUNTER — Ambulatory Visit (INDEPENDENT_AMBULATORY_CARE_PROVIDER_SITE_OTHER): Payer: Medicare Other | Admitting: Adult Health

## 2012-12-17 ENCOUNTER — Encounter: Payer: Self-pay | Admitting: Adult Health

## 2012-12-17 VITALS — BP 142/86 | HR 112 | Temp 97.1°F | Ht 63.0 in | Wt 227.8 lb

## 2012-12-17 DIAGNOSIS — J8410 Pulmonary fibrosis, unspecified: Secondary | ICD-10-CM

## 2012-12-17 NOTE — Assessment & Plan Note (Signed)
Compensated on present regimen without flare  No change in steroid dose -dual purpose for RA and PF  follow up in 4 months

## 2012-12-17 NOTE — Progress Notes (Signed)
  Subjective:    Patient ID: Veronica Stone, female    DOB: 01-Nov-1931, 79 y.o.   MRN: [id number]  HPI   80/F, Ex smoker , quit '90 with RA for FU of pulmonary fibrosis.  She has a strong family history - 07/12/2022 siblings died of 'fibrosis'   She reports dyspnea on exertion but is more limited by her arthritis. Occasional dry cough +  She smoked 30 Pyrs before quitting in '90. Worked at [employer], retired '88  She reports RA since 2008, tried humira, remicaid, now on rituxan, steroid dependent, on 5mg  prednisone [name])  Ct abd 8/10 sub plerual fibrosis  CXR 5/11bibasal scarring  PFTs 7/12 showed preserved lung volumes with FVC 77% & TLC 80%, DLCO reduced at 49%.  She desatn to 86% on second lap.  HRCT 6/12 showed diffuse chronic interstitial coarsening with scattered areas of bronchiectasis bilaterally & subpleural honeycombing consistent with UIP  ONO shows 48 min satn < 88% >O2 at At bedtime  (02/2011 ) Initially declined but started O2 in oct'12  Trial of Lasix >> not much benefit.     06/20/12  50m FU Pt reports breathing has been fine. Has slight cough and wheezing. no chest tx. Pt states she believes she had a mild case of the flu couple weeks ago.  Dyspnea is unchanged, c/o arthritis in her foot Compliant with O2 on exertion & during sleep  Not interested in rehab  Wonders if her fibrosis is familial but not interested in research study , her sister passed away 2022/10/09 Patient denies any chest pain, cough, increased leg swelling, or fever.  Chest x-ray 10/32  showed no change in chronic lung markings 6/13 FVC 82%, does not want to perform full PFTs - don't like that clip over my nose FVC 84% -preserved -   Remains on Prednisone 10mg  daily - steroid sparig agents havenot worked  12/17/2012 Follow up Pulmonary Fibrosis  pt reports breathing is about the same- using 2L o2 when needed-- denies any other concerns at this time.  Activity level remains the same.  No ER or hospital  stays since last ov.  No flare of cough or dyspnea.  Had cold few weeks ago, resolved without treatment.  Remains on prednisone 10mg  daily .    Review of Systems  neg for any significant sore throat, dysphagia, itching, sneezing, nasal congestion or excess/ purulent secretions, fever, chills, sweats, unintended wt loss, pleuritic or exertional cp, hempoptysis, orthopnea pnd or change in chronic leg swelling. Also denies presyncope, palpitations, heartburn, abdominal pain, nausea, vomiting, diarrhea or change in bowel or urinary habits, dysuria,hematuria, rash, arthralgias, visual complaints, headache, numbness weakness or ataxia.     Objective:   Physical Exam   Gen. Pleasant, obese, in no distress ENT - no lesions, no post nasal drip Neck: No JVD, no thyromegaly, no carotid bruits Lungs: no use of accessory muscles, no dullness to percussion, bibasal 1/3 corase dry rales no rhonchi  Cardiovascular: Rhythm regular, heart sounds  normal, no murmurs or gallops, tr  peripheral edema Musculoskeletal: No deformities, no cyanosis or clubbing , no tremors         Assessment & Plan:

## 2012-12-17 NOTE — Patient Instructions (Addendum)
Continue on current regimen . Follow up 4 months and As needed  With Dr. Vassie Loll

## 2013-01-16 ENCOUNTER — Ambulatory Visit (INDEPENDENT_AMBULATORY_CARE_PROVIDER_SITE_OTHER): Payer: Medicare Other | Admitting: Pulmonary Disease

## 2013-01-16 ENCOUNTER — Telehealth: Payer: Self-pay | Admitting: Pulmonary Disease

## 2013-01-16 ENCOUNTER — Encounter: Payer: Self-pay | Admitting: Pulmonary Disease

## 2013-01-16 VITALS — BP 124/88 | HR 110 | Temp 98.0°F | Ht 63.0 in | Wt 225.0 lb

## 2013-01-16 DIAGNOSIS — J8410 Pulmonary fibrosis, unspecified: Secondary | ICD-10-CM | POA: Diagnosis not present

## 2013-01-16 MED ORDER — HYDROCODONE-HOMATROPINE 5-1.5 MG/5ML PO SYRP: 5.0000 mL | ORAL_SOLUTION | Freq: Four times a day (QID) | ORAL | Status: DC | PRN

## 2013-01-16 NOTE — Telephone Encounter (Signed)
I spoke with pt and she is scheduled to come in and see KC this afternoon at 2:45 for acute visit. Nothing further needed

## 2013-01-16 NOTE — Progress Notes (Signed)
  Subjective:    Patient ID: Veronica Stone, female    DOB: Jun 23, 1931, 79 y.o.   MRN: [id number]  HPI The patient comes in today for an acute sick visit.  She has known pulmonary fibrosis related to rheumatoid arthritis, and has been maintained on Rituxan and low-dose prednisone.  She has chronic dyspnea on exertion which is oxygen dependent at times.  She notes a one-month history of malaise, and has had a worsening dry cough for the last one to 2 weeks.  This produces very little mucus, and is nonpurulent.  She denies postnasal drip or reflux symptoms.  She feels that her breathing is probably a little worse over the last one month as well.  She has not had any fevers, chills, or sweats.   Review of Systems  Constitutional: Positive for fever ( pt reports "feeling feverish" recently), appetite change and fatigue. Negative for unexpected weight change.  HENT: Negative for ear pain, nosebleeds, congestion, sore throat, rhinorrhea, sneezing, trouble swallowing, dental problem, postnasal drip and sinus pressure.   Eyes: Negative for redness and itching.  Respiratory: Positive for cough, shortness of breath and wheezing. Negative for chest tightness.   Cardiovascular: Negative for palpitations and leg swelling.  Gastrointestinal: Negative for nausea and vomiting.  Genitourinary: Negative for dysuria.  Musculoskeletal: Negative for joint swelling.  Skin: Negative for rash.  Neurological: Negative for headaches.  Hematological: Does not bruise/bleed easily.  Psychiatric/Behavioral: Negative for dysphoric mood. The patient is not nervous/anxious.        Objective:   Physical Exam Obese female in no acute distress Nose without purulent discharge noted Oropharynx clear Neck without lymphadenopathy or thyromegaly Chest with crackles half the way up bilaterally, no wheezes Cardiac exam with regular rate and rhythm Lower extremities with 2+ edema, varicosities, no cyanosis Alert and oriented,  moves all 4 extremities.       Assessment & Plan:

## 2013-01-16 NOTE — Assessment & Plan Note (Signed)
The patient has a worsening cough that I suspect is secondary to her pulmonary fibrosis.  We always have concerns about an early presentation for an acute flare, as well as underlying infection given her immunosuppressants.  There is no a lot of history to support this currently.  She denies any symptoms of upper airway cough presently.  At this point, I would like to treat her conservatively with cough suppression, but she is to let us know if she does not improve, or if she has worsening symptoms which may indicate a flare.

## 2013-01-16 NOTE — Patient Instructions (Addendum)
Stay on your medication for reflux Will treat your cough with cough suppressant for now.  However, please call us if it is worsening or if you are having increased shortness of breath going forward.   Keep followup apptm with Dr. Vassie Loll.

## 2013-01-22 ENCOUNTER — Other Ambulatory Visit (HOSPITAL_COMMUNITY): Payer: Self-pay | Admitting: *Deleted

## 2013-01-23 ENCOUNTER — Encounter (HOSPITAL_COMMUNITY)
Admission: RE | Admit: 2013-01-23 | Discharge: 2013-01-23 | Disposition: A | Discharge status: Home / Self Care | Payer: Medicare Other | Source: Ambulatory Visit | Attending: Rheumatology | Admitting: Rheumatology

## 2013-01-23 DIAGNOSIS — M069 Rheumatoid arthritis, unspecified: Secondary | ICD-10-CM | POA: Insufficient documentation

## 2013-01-23 DIAGNOSIS — J8410 Pulmonary fibrosis, unspecified: Secondary | ICD-10-CM | POA: Insufficient documentation

## 2013-01-23 MED ORDER — ACETAMINOPHEN 325 MG PO TABS
650.0000 mg | ORAL_TABLET | ORAL | Status: DC
  Administered 2013-01-23: 650 mg via ORAL
  Filled 2013-01-23: qty 2

## 2013-01-23 MED ORDER — SODIUM CHLORIDE 0.9 % IV SOLN
1000.0000 mg | INTRAVENOUS | Status: DC
  Administered 2013-01-23: 1000 mg via INTRAVENOUS
  Filled 2013-01-23: qty 100

## 2013-01-23 MED ORDER — SODIUM CHLORIDE 0.9 % IV SOLN
INTRAVENOUS | Status: DC
  Administered 2013-01-23: 10:00:00 via INTRAVENOUS

## 2013-01-23 MED ORDER — METHYLPREDNISOLONE SODIUM SUCC 125 MG IJ SOLR
100.0000 mg | INTRAMUSCULAR | Status: DC
  Administered 2013-01-23: 10:00:00 via INTRAVENOUS
  Filled 2013-01-23: qty 2

## 2013-02-06 ENCOUNTER — Encounter (HOSPITAL_COMMUNITY)
Admission: RE | Admit: 2013-02-06 | Discharge: 2013-02-06 | Disposition: A | Discharge status: Home / Self Care | Payer: Medicare Other | Source: Ambulatory Visit | Attending: Rheumatology | Admitting: Rheumatology

## 2013-02-06 MED ORDER — ACETAMINOPHEN 325 MG PO TABS
ORAL_TABLET | ORAL | Status: AC
  Administered 2013-02-06: 650 mg via ORAL
  Filled 2013-02-06: qty 2

## 2013-02-06 MED ORDER — METHYLPREDNISOLONE SODIUM SUCC 125 MG IJ SOLR
INTRAMUSCULAR | Status: AC
  Administered 2013-02-06: 100 mg via INTRAVENOUS
  Filled 2013-02-06: qty 2

## 2013-02-06 MED ORDER — SODIUM CHLORIDE 0.9 % IV SOLN
INTRAVENOUS | Status: AC
  Administered 2013-02-06: 09:00:00 via INTRAVENOUS

## 2013-02-06 MED ORDER — ACETAMINOPHEN 325 MG PO TABS: 650.0000 mg | ORAL_TABLET | ORAL | Status: AC

## 2013-02-06 MED ORDER — SODIUM CHLORIDE 0.9 % IV SOLN
1000.0000 mg | INTRAVENOUS | Status: AC
  Administered 2013-02-06: 1000 mg via INTRAVENOUS
  Filled 2013-02-06: qty 100

## 2013-02-06 MED ORDER — METHYLPREDNISOLONE SODIUM SUCC 125 MG IJ SOLR: 100.0000 mg | INTRAMUSCULAR | Status: AC

## 2013-02-16 DIAGNOSIS — Z23 Encounter for immunization: Secondary | ICD-10-CM | POA: Diagnosis not present

## 2013-03-28 DIAGNOSIS — M899 Disorder of bone, unspecified: Secondary | ICD-10-CM | POA: Diagnosis not present

## 2013-03-28 DIAGNOSIS — I10 Essential (primary) hypertension: Secondary | ICD-10-CM | POA: Diagnosis not present

## 2013-04-04 DIAGNOSIS — E785 Hyperlipidemia, unspecified: Secondary | ICD-10-CM | POA: Diagnosis not present

## 2013-04-04 DIAGNOSIS — I10 Essential (primary) hypertension: Secondary | ICD-10-CM | POA: Diagnosis not present

## 2013-04-04 DIAGNOSIS — M899 Disorder of bone, unspecified: Secondary | ICD-10-CM | POA: Diagnosis not present

## 2013-04-04 DIAGNOSIS — H34219 Partial retinal artery occlusion, unspecified eye: Secondary | ICD-10-CM | POA: Diagnosis not present

## 2013-04-25 ENCOUNTER — Other Ambulatory Visit: Payer: Self-pay | Admitting: Gastroenterology

## 2013-05-01 DIAGNOSIS — M069 Rheumatoid arthritis, unspecified: Secondary | ICD-10-CM | POA: Diagnosis not present

## 2013-05-06 IMAGING — CR DG CHEST 2V
2 series · 2 of 2 positions shown · non-contrast
Comparison: 09/26/2009

CLINICAL DATA: Pulmonary fibrosis, shortness of breath

CHEST - 2 VIEW

[view not recorded (1 of 2)]
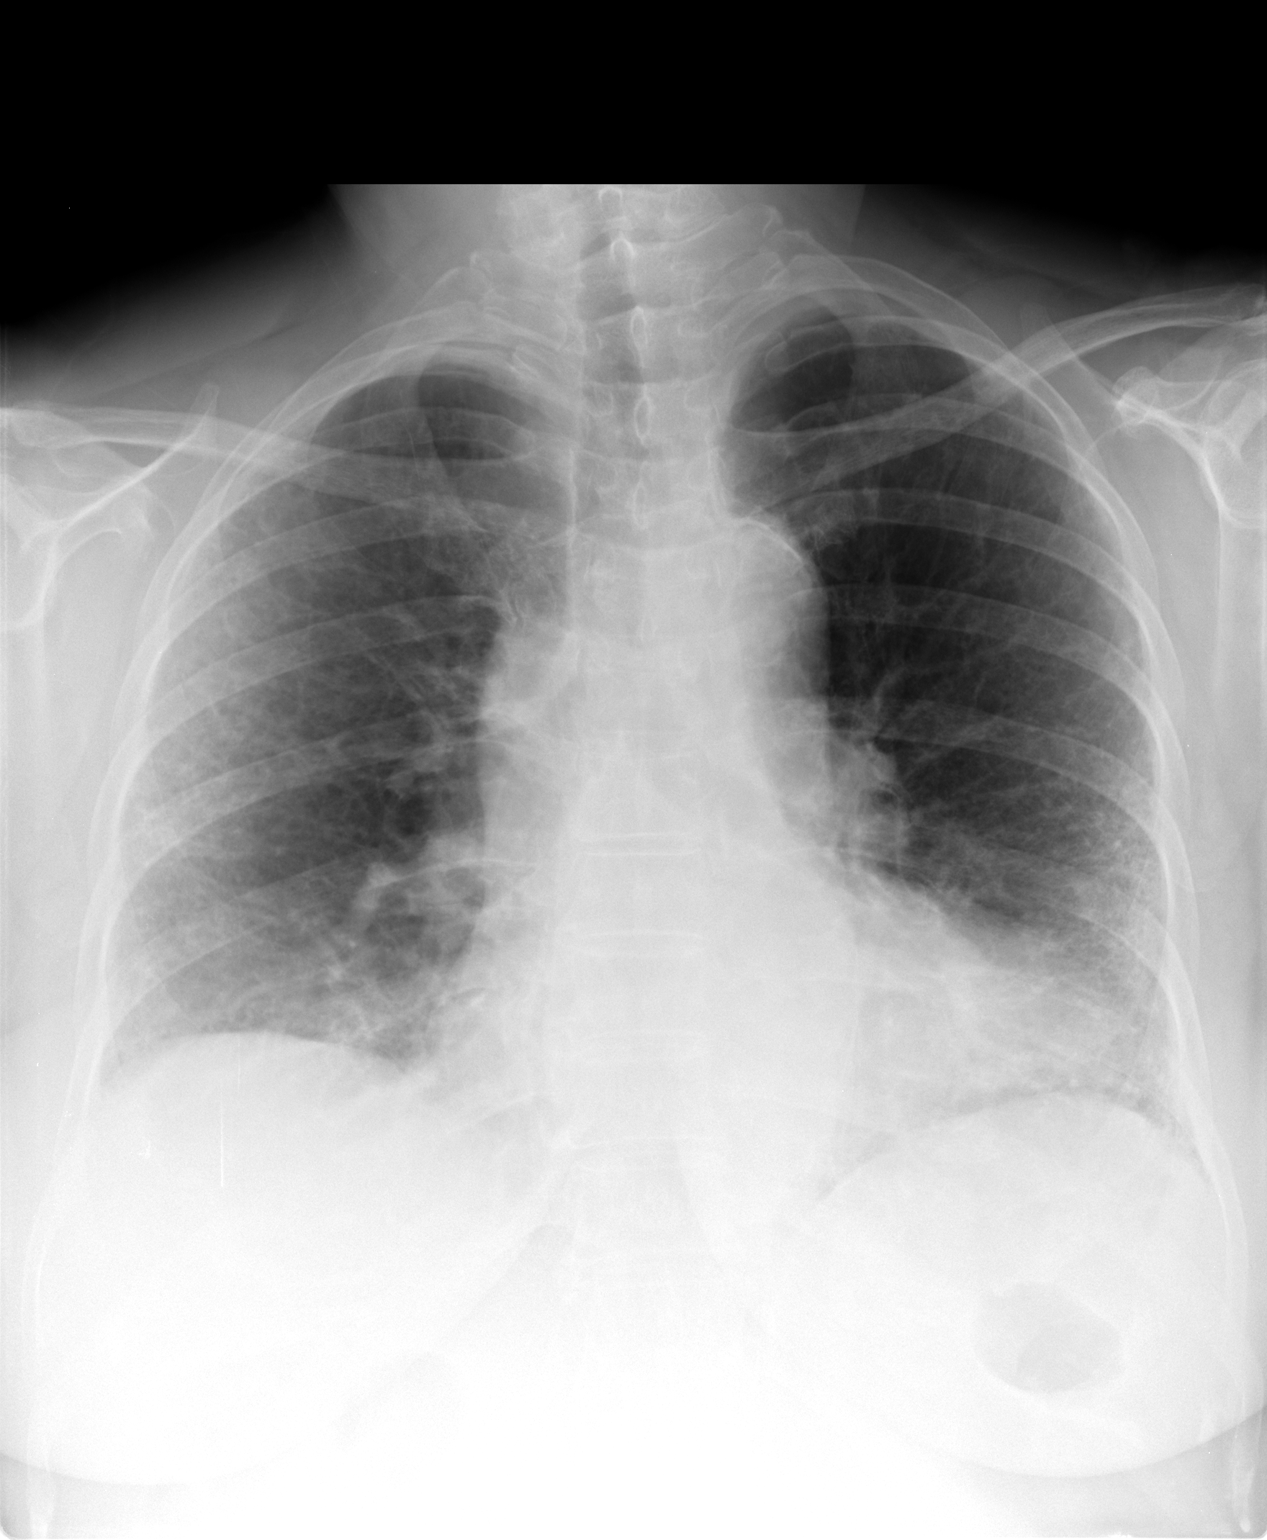

[view not recorded (2 of 2)]
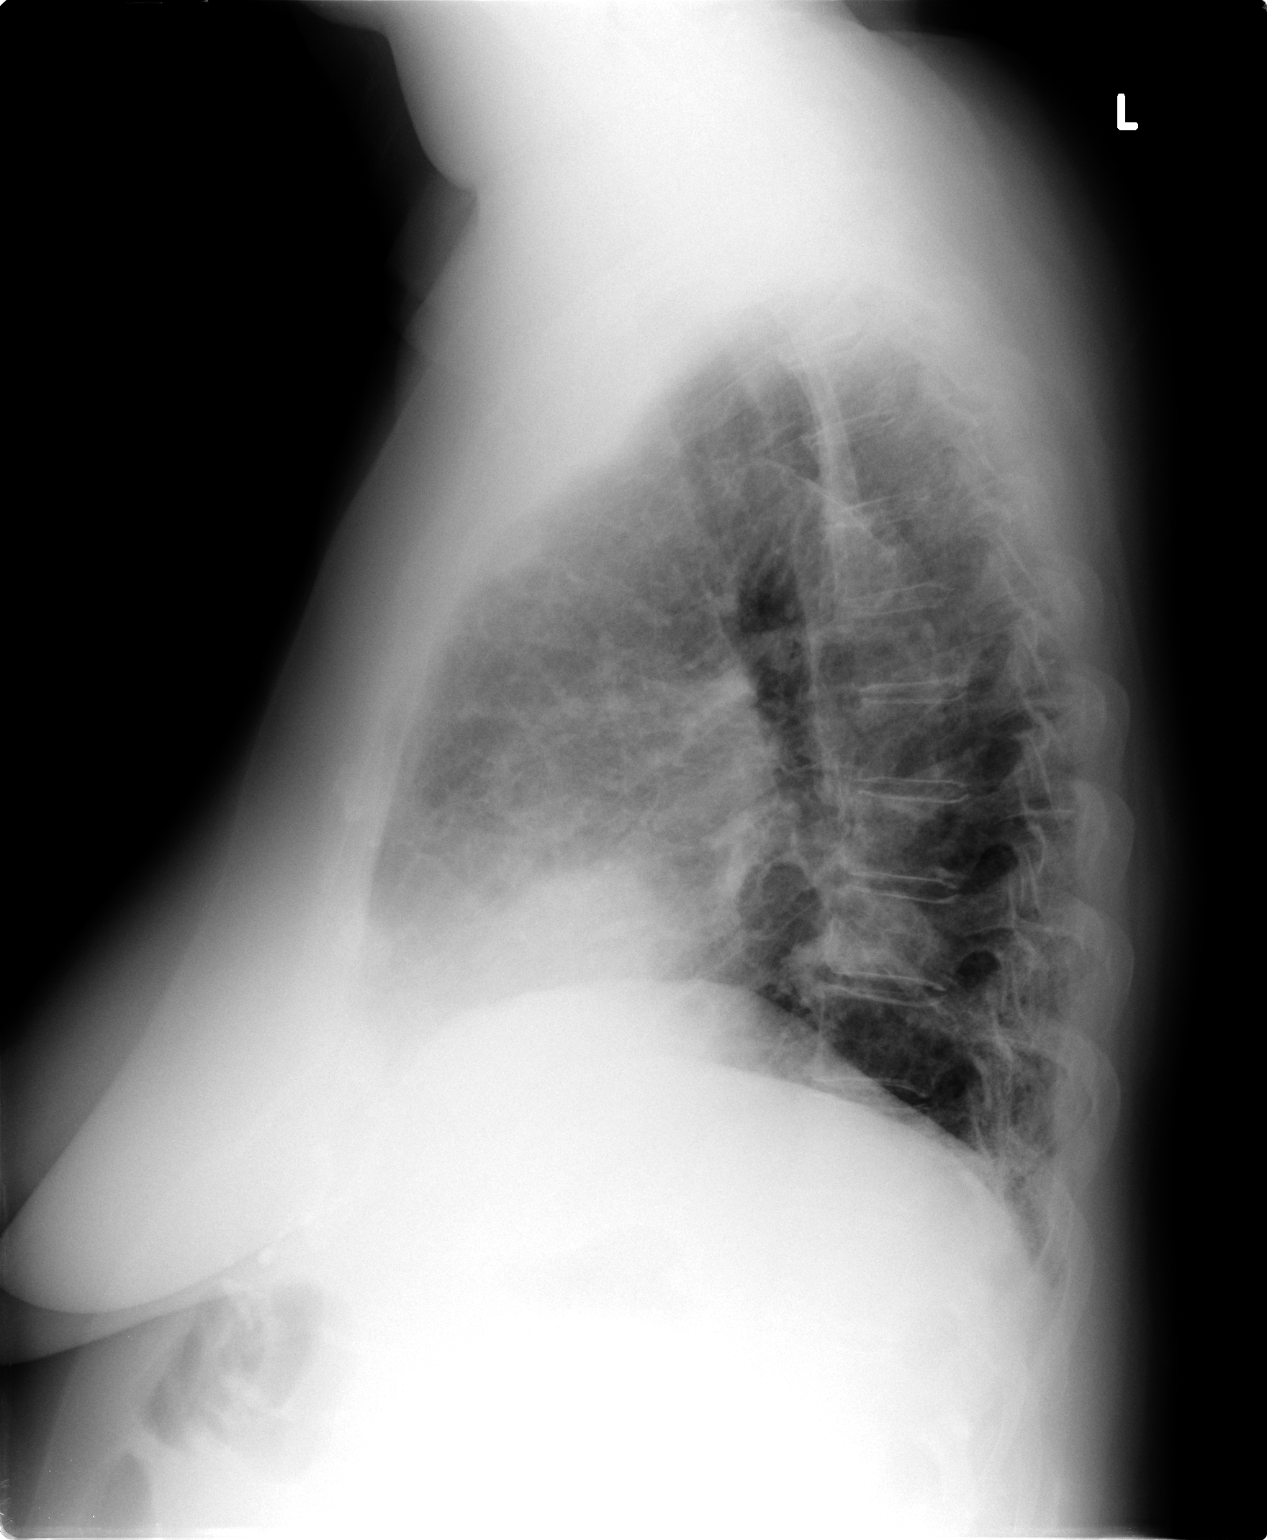

[2 of 2 positions shown; findings below may reference images not displayed]

FINDINGS: Peripheral and bibasilar fibrotic changes as before.  No
confluent airspace infiltrate. Stable mild cardiomegaly.
Atheromatous aortic arch.  No effusion.  Regional bones
unremarkable.
IMPRESSION: 1.  Stable interstitial lung disease without acute or superimposed
abnormality.
2.  Stable mild cardiomegaly.

## 2013-05-27 ENCOUNTER — Ambulatory Visit: Payer: Medicare Other | Admitting: Pulmonary Disease

## 2013-07-03 ENCOUNTER — Ambulatory Visit (INDEPENDENT_AMBULATORY_CARE_PROVIDER_SITE_OTHER): Payer: Medicare Other | Admitting: Pulmonary Disease

## 2013-07-03 ENCOUNTER — Encounter: Payer: Self-pay | Admitting: Pulmonary Disease

## 2013-07-03 ENCOUNTER — Ambulatory Visit (INDEPENDENT_AMBULATORY_CARE_PROVIDER_SITE_OTHER)
Admission: RE | Admit: 2013-07-03 | Discharge: 2013-07-03 | Disposition: A | Discharge status: Home / Self Care | Payer: Medicare Other | Source: Ambulatory Visit | Attending: Pulmonary Disease | Admitting: Pulmonary Disease

## 2013-07-03 VITALS — BP 138/80 | HR 88 | Temp 97.5°F | Ht 63.0 in | Wt 230.4 lb

## 2013-07-03 DIAGNOSIS — J8410 Pulmonary fibrosis, unspecified: Secondary | ICD-10-CM | POA: Diagnosis not present

## 2013-07-03 MED ORDER — HYDROCODONE-HOMATROPINE 5-1.5 MG/5ML PO SYRP: 5.0000 mL | ORAL_SOLUTION | Freq: Four times a day (QID) | ORAL | Status: DC | PRN

## 2013-07-03 NOTE — Progress Notes (Signed)
   Subjective:    Patient ID: Veronica Stone, female    DOB: Nov 12, 1931, 80 y.o.   MRN: [id number]  HPI  81/F, Ex smoker , quit '90 with RA for FU of pulmonary fibrosis.  She has a strong family history - 07/16/2022 siblings died of 'fibrosis'  She reports dyspnea on exertion but is more limited by her arthritis. Occasional dry cough +  She smoked 30 Pyrs before quitting in '90. Worked at [employer], retired '88  She reports RA since 2008, tried humira, remicaid, now on rituxan q 68mnths, steroid dependent, on daily prednisone [name])  Ct abd 8/10 sub pleural fibrosis  CXR 5/11bibasal scarring  PFTs 7/12 showed preserved lung volumes with FVC 77% & TLC 80%, DLCO reduced at 49%.  She desatn to 86% on second lap.  HRCT 6/12 showed diffuse chronic interstitial coarsening with scattered areas of bronchiectasis bilaterally & subpleural honeycombing consistent with UIP  ONO- 48 min satn < 88% >O2 at At bedtime (02/2011 )  - started O2 in oct'12  Trial of Lasix >> not much benefit.    Not interested in rehab  Wonders if her fibrosis is familial but not interested in research study , her sister passed away 2011/10/03    2011-11-03 FVC 82%, does not want to perform full PFTs - don't like that clip over my nose  FVC 84% -preserved -      07/03/2013  Chief Complaint  Patient presents with  . Follow-up    Pulse/ox 85% and HR 103 when arrival to exam room. Pt states SOB with activity. Complains of dry cough and wheezing. Denies Chest pain, tightness.    Remains on Prednisone 10mg  daily - steroid sparig agents havenot worked  pt reports breathing is about the same- using 2L o2 when needed-- satn dropped on walking Activity level remains the same- gives out when walking  No ER or hospital stays since last ov.  No flare of cough or dyspnea.  Satn stayed well on 3L pulse, needed 4L on second lap .    Review of Systems neg for any significant sore throat, dysphagia, itching, sneezing, nasal  congestion or excess/ purulent secretions, fever, chills, sweats, unintended wt loss, pleuritic or exertional cp, hempoptysis, orthopnea pnd or change in chronic leg swelling. Also denies presyncope, palpitations, heartburn, abdominal pain, nausea, vomiting, diarrhea or change in bowel or urinary habits, dysuria,hematuria, rash, arthralgias, visual complaints, headache, numbness weakness or ataxia.     Objective:   Physical Exam  Gen. Pleasant, obese, in no distress ENT - no lesions, no post nasal drip Neck: No JVD, no thyromegaly, no carotid bruits Lungs: no use of accessory muscles, no dullness to percussion, decreased without rales or rhonchi  Cardiovascular: Rhythm regular, heart sounds  normal, no murmurs or gallops, no peripheral edema Musculoskeletal: No deformities, no cyanosis or clubbing , no tremors       Assessment & Plan:

## 2013-07-03 NOTE — Assessment & Plan Note (Signed)
You need 3-4  L/min O2 pulse when walking, 2L OK at rest Weigh yourself m/w/f to decide about lasix Refill on hycodan CXR today

## 2013-07-03 NOTE — Patient Instructions (Signed)
You need  L/min O2 pulse when walking, 2L OK at rest Weigh yourself m/w/f to decide about lasix Refill on hycodan CXR today

## 2013-08-14 DIAGNOSIS — H3530 Unspecified macular degeneration: Secondary | ICD-10-CM | POA: Diagnosis not present

## 2013-08-14 DIAGNOSIS — H26499 Other secondary cataract, unspecified eye: Secondary | ICD-10-CM | POA: Diagnosis not present

## 2013-08-14 DIAGNOSIS — H43819 Vitreous degeneration, unspecified eye: Secondary | ICD-10-CM | POA: Diagnosis not present

## 2013-08-14 DIAGNOSIS — H43399 Other vitreous opacities, unspecified eye: Secondary | ICD-10-CM | POA: Diagnosis not present

## 2013-09-04 ENCOUNTER — Other Ambulatory Visit (HOSPITAL_COMMUNITY): Payer: Self-pay | Admitting: *Deleted

## 2013-09-05 ENCOUNTER — Encounter (HOSPITAL_COMMUNITY)
Admission: RE | Admit: 2013-09-05 | Discharge: 2013-09-05 | Disposition: A | Discharge status: Home / Self Care | Payer: Medicare Other | Source: Ambulatory Visit | Attending: Rheumatology | Admitting: Rheumatology

## 2013-09-05 DIAGNOSIS — M069 Rheumatoid arthritis, unspecified: Secondary | ICD-10-CM | POA: Diagnosis not present

## 2013-09-05 MED ORDER — METHYLPREDNISOLONE SODIUM SUCC 125 MG IJ SOLR
100.0000 mg | INTRAMUSCULAR | Status: DC
  Administered 2013-09-05: 100 mg via INTRAVENOUS

## 2013-09-05 MED ORDER — ACETAMINOPHEN 325 MG PO TABS
ORAL_TABLET | ORAL | Status: AC
  Filled 2013-09-05: qty 2

## 2013-09-05 MED ORDER — ACETAMINOPHEN 325 MG PO TABS
650.0000 mg | ORAL_TABLET | ORAL | Status: DC
  Administered 2013-09-05: 650 mg via ORAL

## 2013-09-05 MED ORDER — SODIUM CHLORIDE 0.9 % IV SOLN
1000.0000 mg | INTRAVENOUS | Status: DC
  Administered 2013-09-05: 1000 mg via INTRAVENOUS
  Filled 2013-09-05: qty 100

## 2013-09-05 MED ORDER — METHYLPREDNISOLONE SODIUM SUCC 125 MG IJ SOLR
INTRAMUSCULAR | Status: AC
  Filled 2013-09-05: qty 2

## 2013-09-05 MED ORDER — SODIUM CHLORIDE 0.9 % IV SOLN
INTRAVENOUS | Status: DC
  Administered 2013-09-05 (×2): via INTRAVENOUS

## 2013-09-20 ENCOUNTER — Encounter (HOSPITAL_COMMUNITY)
Admission: RE | Admit: 2013-09-20 | Discharge: 2013-09-20 | Disposition: A | Discharge status: Home / Self Care | Payer: Medicare Other | Source: Ambulatory Visit | Attending: Rheumatology | Admitting: Rheumatology

## 2013-09-20 DIAGNOSIS — M069 Rheumatoid arthritis, unspecified: Secondary | ICD-10-CM | POA: Insufficient documentation

## 2013-09-20 MED ORDER — METHYLPREDNISOLONE SODIUM SUCC 125 MG IJ SOLR
100.0000 mg | INTRAMUSCULAR | Status: DC
  Administered 2013-09-20: 100 mg via INTRAVENOUS

## 2013-09-20 MED ORDER — ACETAMINOPHEN 325 MG PO TABS
ORAL_TABLET | ORAL | Status: AC
  Filled 2013-09-20: qty 2

## 2013-09-20 MED ORDER — ACETAMINOPHEN 325 MG PO TABS
650.0000 mg | ORAL_TABLET | ORAL | Status: DC
  Administered 2013-09-20: 650 mg via ORAL

## 2013-09-20 MED ORDER — SODIUM CHLORIDE 0.9 % IV SOLN
INTRAVENOUS | Status: DC
  Administered 2013-09-20: 09:00:00 via INTRAVENOUS

## 2013-09-20 MED ORDER — LORATADINE 10 MG PO TABS
ORAL_TABLET | ORAL | Status: AC
  Filled 2013-09-20: qty 1

## 2013-09-20 MED ORDER — METHYLPREDNISOLONE SODIUM SUCC 125 MG IJ SOLR
INTRAMUSCULAR | Status: AC
  Filled 2013-09-20: qty 2

## 2013-09-20 MED ORDER — SODIUM CHLORIDE 0.9 % IV SOLN
1000.0000 mg | INTRAVENOUS | Status: DC
  Administered 2013-09-20: 1000 mg via INTRAVENOUS
  Filled 2013-09-20: qty 100

## 2013-09-26 DIAGNOSIS — Z0000 Encounter for general adult medical examination without abnormal findings: Secondary | ICD-10-CM | POA: Diagnosis not present

## 2013-09-26 DIAGNOSIS — N390 Urinary tract infection, site not specified: Secondary | ICD-10-CM | POA: Diagnosis not present

## 2013-09-26 DIAGNOSIS — M949 Disorder of cartilage, unspecified: Secondary | ICD-10-CM | POA: Diagnosis not present

## 2013-09-26 DIAGNOSIS — Z1331 Encounter for screening for depression: Secondary | ICD-10-CM | POA: Diagnosis not present

## 2013-09-26 DIAGNOSIS — E785 Hyperlipidemia, unspecified: Secondary | ICD-10-CM | POA: Diagnosis not present

## 2013-09-26 DIAGNOSIS — M899 Disorder of bone, unspecified: Secondary | ICD-10-CM | POA: Diagnosis not present

## 2013-09-26 DIAGNOSIS — I10 Essential (primary) hypertension: Secondary | ICD-10-CM | POA: Diagnosis not present

## 2013-09-26 DIAGNOSIS — E039 Hypothyroidism, unspecified: Secondary | ICD-10-CM | POA: Diagnosis not present

## 2013-10-10 DIAGNOSIS — R0602 Shortness of breath: Secondary | ICD-10-CM | POA: Diagnosis not present

## 2013-10-10 DIAGNOSIS — E785 Hyperlipidemia, unspecified: Secondary | ICD-10-CM | POA: Diagnosis not present

## 2013-10-10 DIAGNOSIS — H34219 Partial retinal artery occlusion, unspecified eye: Secondary | ICD-10-CM | POA: Diagnosis not present

## 2013-10-10 DIAGNOSIS — K219 Gastro-esophageal reflux disease without esophagitis: Secondary | ICD-10-CM | POA: Diagnosis not present

## 2013-10-10 DIAGNOSIS — I10 Essential (primary) hypertension: Secondary | ICD-10-CM | POA: Diagnosis not present

## 2013-10-11 ENCOUNTER — Other Ambulatory Visit (HOSPITAL_COMMUNITY): Payer: Self-pay | Admitting: Internal Medicine

## 2013-10-11 DIAGNOSIS — R0602 Shortness of breath: Secondary | ICD-10-CM

## 2013-10-23 DIAGNOSIS — B353 Tinea pedis: Secondary | ICD-10-CM | POA: Diagnosis not present

## 2013-10-23 DIAGNOSIS — M069 Rheumatoid arthritis, unspecified: Secondary | ICD-10-CM | POA: Diagnosis not present

## 2013-10-24 ENCOUNTER — Encounter (HOSPITAL_COMMUNITY): Payer: Medicare Other

## 2013-10-30 ENCOUNTER — Telehealth (HOSPITAL_COMMUNITY): Payer: Self-pay

## 2013-10-31 ENCOUNTER — Ambulatory Visit (INDEPENDENT_AMBULATORY_CARE_PROVIDER_SITE_OTHER): Payer: Medicare Other | Admitting: Adult Health

## 2013-10-31 ENCOUNTER — Encounter: Payer: Self-pay | Admitting: Adult Health

## 2013-10-31 VITALS — BP 132/84 | HR 94 | Temp 97.7°F | Ht 62.0 in | Wt 225.6 lb

## 2013-10-31 DIAGNOSIS — J8410 Pulmonary fibrosis, unspecified: Secondary | ICD-10-CM

## 2013-10-31 NOTE — Patient Instructions (Signed)
Continue on current regimen. Continue on Oxygen therapy .  We will set you up for evaluation for portable concentrator.  Follow up Dr. Elsworth Soho in 4 months and as needed

## 2013-10-31 NOTE — Assessment & Plan Note (Addendum)
Compensated on present regimen Declines Prevnar vaccine.   Plan   Continue on current regimen. Continue on Oxygen therapy .  We will set you up for evaluation for portable concentrator.  Follow up Dr. Elsworth Soho in 4 months and as needed

## 2013-10-31 NOTE — Addendum Note (Signed)
Addended by: Parke Poisson E on: 10/31/2013 03:06 PM   Modules accepted: Orders

## 2013-10-31 NOTE — Progress Notes (Signed)
Reviewed & agree with plan  

## 2013-10-31 NOTE — Progress Notes (Signed)
   Subjective:    Patient ID: Veronica Stone, female    DOB: 1931/12/28, 80 y.o.   MRN: [id number]  HPI   81/F, Ex smoker , quit '90 with RA for FU of pulmonary fibrosis.  She has a strong family history - July 15, 2022 siblings died of 'fibrosis'  She reports dyspnea on exertion but is more limited by her arthritis. Occasional dry cough +  She smoked 30 Pyrs before quitting in '90. Worked at [employer], retired '88  She reports RA since 2008, tried humira, remicaid, now on rituxan q 55mnths, steroid dependent, on daily prednisone [name])  Ct abd 8/10 sub pleural fibrosis  CXR 5/11bibasal scarring  PFTs 7/12 showed preserved lung volumes with FVC 77% & TLC 80%, DLCO reduced at 49%.  She desatn to 86% on second lap.  HRCT 6/12 showed diffuse chronic interstitial coarsening with scattered areas of bronchiectasis bilaterally & subpleural honeycombing consistent with UIP  ONO- 48 min satn < 88% >O2 at At bedtime (02/2011 )  - started O2 in oct'12  Trial of Lasix >> not much benefit.    Not interested in rehab  Wonders if her fibrosis is familial but not interested in research study , her sister passed away 09-30-2011    10/31/2011 FVC 82%, does not want to perform full PFTs - don't like that clip over my nose  FVC 84% -preserved -      07/03/13  Remains on Prednisone 10mg  daily - steroid sparig agents havenot worked pt reports breathing is about the same- using 2L o2 when needed-- satn dropped on walking Activity level remains the same- gives out when walking  No ER or hospital stays since last ov.  No flare of cough or dyspnea.  Satn stayed well on 3L pulse, needed 4L on second lap  10/31/2013 Follow up  Pulmonary Fibrosis  Patient returns for a four-month followup for pulmonary fibrosis. She remains on 10 mg of prednisone. Patient has underlying rheumatoid arthritis. She denies any flare of cough, shortness, of breath. No ER or hospital stay. Since last visit Request evaluation for possible  portable concentrator. She denies any hemoptysis, chest pain, orthopnea, PND, or  Increased leg swelling.   Review of Systems  neg for any significant sore throat, dysphagia, itching, sneezing, nasal congestion or excess/ purulent secretions, fever, chills, sweats, unintended wt loss, pleuritic or exertional cp, hempoptysis, orthopnea pnd or change in chronic leg swelling. Also denies presyncope, palpitations, heartburn, abdominal pain, nausea, vomiting, diarrhea or change in bowel or urinary habits, dysuria,hematuria, rash, arthralgias, visual complaints, headache, numbness weakness or ataxia.     Objective:   Physical Exam   Gen. Pleasant, obese, in no distress ENT - no lesions, no post nasal drip Neck: No JVD, no thyromegaly, no carotid bruits Lungs: no use of accessory muscles, no dullness to percussion, decreased w/ bibasilar crackles  Cardiovascular: Rhythm regular, heart sounds  normal, no murmurs or gallops, tr peripheral edema Musculoskeletal: No deformities, no cyanosis or clubbing , no tremors       Assessment & Plan:

## 2013-11-01 ENCOUNTER — Ambulatory Visit (HOSPITAL_COMMUNITY)
Admission: RE | Admit: 2013-11-01 | Discharge: 2013-11-01 | Disposition: A | Discharge status: Home / Self Care | Payer: Medicare Other | Source: Ambulatory Visit | Attending: Cardiovascular Disease | Admitting: Cardiovascular Disease

## 2013-11-01 DIAGNOSIS — R0602 Shortness of breath: Secondary | ICD-10-CM | POA: Diagnosis not present

## 2013-11-01 MED ORDER — TECHNETIUM TC 99M SESTAMIBI GENERIC - CARDIOLITE
10.4000 | Freq: Once | INTRAVENOUS | Status: AC | PRN
  Administered 2013-11-01: 10 via INTRAVENOUS

## 2013-11-01 MED ORDER — AMINOPHYLLINE 25 MG/ML IV SOLN
125.0000 mg | Freq: Once | INTRAVENOUS | Status: AC
  Administered 2013-11-01: 125 mg via INTRAVENOUS

## 2013-11-01 MED ORDER — TECHNETIUM TC 99M SESTAMIBI GENERIC - CARDIOLITE
30.6000 | Freq: Once | INTRAVENOUS | Status: AC | PRN
  Administered 2013-11-01: 31 via INTRAVENOUS

## 2013-11-01 MED ORDER — REGADENOSON 0.4 MG/5ML IV SOLN
0.4000 mg | Freq: Once | INTRAVENOUS | Status: AC
  Administered 2013-11-01: 0.4 mg via INTRAVENOUS

## 2013-11-01 NOTE — Procedures (Addendum)
[facility] [address] [place] [address] [phone]  Cardiology Nuclear Med Study  Veronica Stone is a 80 y.o. female     MRN : [id number]     DOB: 09-10-1931  Procedure Date: 11/01/2013  Nuclear Med Background Indication for Stress Test:  Evaluation for Ischemia History:  Irregular heart rate;pulmonary fibrosis;colon DVT;Last NUC MPI on 07/31/2008-nonischemic;EF=73% Cardiac Risk Factors: Family History - CAD, History of Smoking, Hypertension, Lipids, Obesity, PVD and AAA  Symptoms:  DOE, Palpitations and SOB   Nuclear Pre-Procedure Caffeine/Decaff Intake:  9:00pm NPO After: 7:00am   IV Site: R Forearm  IV 0.9% NS with Angio Cath:  22g  Chest Size (in):  n/a IV Started by: Rolene Course, RN  Height: 5\' 3"  (1.6 m)  Cup Size: C  BMI:  Body mass index is 40.22 kg/(m^2). Weight:  227 lb (102.967 kg)   Tech Comments:  n/a    Nuclear Med Study 1 or 2 day study: 1 day  Stress Test Type:  [place] Provider:  Trilby Drummer, MD   Resting Radionuclide: Technetium 66m Sestamibi  Resting Radionuclide Dose: 10.4 mCi   Stress Radionuclide:  Technetium 78m Sestamibi  Stress Radionuclide Dose: 30.6 mCi           Stress Protocol Rest HR: 79 Stress HR: 81  Rest BP: 114/79 Stress BP: 107/83  Exercise Time (min): n/a METS: n/a   Predicted Max HR: 141 bpm % Max HR: 66.67 bpm Rate Pressure Product: 11844  Dose of Adenosine (mg):  n/a Dose of Lexiscan: 0.4 mg  Dose of Atropine (mg): n/a Dose of Dobutamine: n/a mcg/kg/min (at max HR)  Stress Test Technologist: Leane Para, CCT Nuclear Technologist: Imagene Riches, CNMT   Rest Procedure:  Myocardial perfusion imaging was performed at rest 45 minutes following the intravenous administration of Technetium 28m Sestamibi. Stress Procedure:  The patient received IV Lexiscan 0.4 mg over 15-seconds.  Technetium 37m Sestamibi injected IV at 30-seconds.  Patient  experienced SOB and a sick stomach and 125 mg Aminophylline IV was administered. There were no significant changes with Lexiscan.  Quantitative spect images were obtained after a 45 minute delay.  Transient Ischemic Dilatation (Normal <1.22):  1.77  QGS EDV:  36 ml QGS ESV:  9 ml LV Ejection Fraction: 76%     Rest ECG: NSR-RBBB  Stress ECG: No significant change from baseline ECG  QPS Raw Data Images:  Patient motion noted. Stress Images:  Normal homogeneous uptake in all areas of the myocardium. Rest Images:  Normal homogeneous uptake in all areas of the myocardium. Subtraction (SDS):  Normal  Impression Exercise Capacity:  Lexiscan with no exercise. BP Response:  Normal blood pressure response. Clinical Symptoms:  Mild shortness of breath ECG Impression:  No significant ECG changes with Lexiscan. Comparison with Prior Nuclear Study: No images to compare  Overall Impression:  Normal stress nuclear study.  LV Wall Motion:  NL LV Function, EF 76%; NL Wall Motion   KELLY,THOMAS A, MD  11/01/2013 2:08 PM

## 2013-11-26 NOTE — Telephone Encounter (Signed)
Encounter complete. 

## 2013-12-23 ENCOUNTER — Encounter: Payer: Self-pay | Admitting: Gastroenterology

## 2014-02-21 DIAGNOSIS — Z23 Encounter for immunization: Secondary | ICD-10-CM | POA: Diagnosis not present

## 2014-02-27 DIAGNOSIS — M949 Disorder of cartilage, unspecified: Secondary | ICD-10-CM | POA: Diagnosis not present

## 2014-03-03 ENCOUNTER — Telehealth: Payer: Self-pay | Admitting: Pulmonary Disease

## 2014-03-03 NOTE — Telephone Encounter (Signed)
Veronica Stone left and requesting call regarding this. [email or web address] on Veronica Stone's named VM   Last OV with RA: 07/03/13 and was seen by TP on 10/31/13

## 2014-03-03 NOTE — Telephone Encounter (Signed)
RA not in the office until tomorrow morning 10/20 Hydromet last refilled 07/03/2013 by RA Daughter is requesting a refill today preferably, would come back to the office later this afternoon to pick up rx if another provider will sign the rx  TP, pt saw you last on 6.18.15 for 4 month Fibrosis follow up Would you be okay with signing rx for pt?  Thank you.

## 2014-03-03 NOTE — Telephone Encounter (Signed)
Is she sick ??? Having a flare????  Should have a follow up with Dr. Elsworth Soho  This month , was this set up ??

## 2014-03-04 MED ORDER — HYDROCODONE-HOMATROPINE 5-1.5 MG/5ML PO SYRP: 5.0000 mL | ORAL_SOLUTION | Freq: Four times a day (QID) | ORAL | Status: DC | PRN

## 2014-03-04 NOTE — Telephone Encounter (Signed)
Pt aware okay for refill. RX printed and will mail to her home address. Nothing further needed

## 2014-03-04 NOTE — Telephone Encounter (Signed)
Called and spoke to pt. Pt only c/o increase in dry cough that is worse at night and is keeping her up during the night. Pt denies significant cough during the day. Pt denies change in SOB. Pt denies CP/tightnesss, f/c/s. Pt last seen on 10/31/2013 by TP. Pt has upcoming appt with RA on 11/10 and feels she does not need to come in sooner for appt she just wants her cough suppressed. Pt requesting response today.   Hycodan last filled on 07/03/2013. Filled for 172mls, 0 refills. Take 48mls q6h prn.  RA please advise if we can fill Hycodan.

## 2014-03-04 NOTE — Telephone Encounter (Signed)
Patient calling asking for hydrocodone syrup.  She states she has coughing spells at night.  Would like rx mailed to home address.   [phone]

## 2014-03-04 NOTE — Telephone Encounter (Signed)
Ok to refill hycodan 

## 2014-03-21 ENCOUNTER — Other Ambulatory Visit (HOSPITAL_COMMUNITY): Payer: Self-pay | Admitting: *Deleted

## 2014-03-24 ENCOUNTER — Encounter (HOSPITAL_COMMUNITY)
Admission: RE | Admit: 2014-03-24 | Discharge: 2014-03-24 | Disposition: A | Discharge status: Home / Self Care | Payer: Medicare Other | Source: Ambulatory Visit | Attending: Rheumatology | Admitting: Rheumatology

## 2014-03-24 DIAGNOSIS — M069 Rheumatoid arthritis, unspecified: Secondary | ICD-10-CM | POA: Insufficient documentation

## 2014-03-24 MED ORDER — ACETAMINOPHEN 325 MG PO TABS
ORAL_TABLET | ORAL | Status: AC
  Filled 2014-03-24: qty 2

## 2014-03-24 MED ORDER — SODIUM CHLORIDE 0.9 % IV SOLN
INTRAVENOUS | Status: DC
  Administered 2014-03-24: 09:00:00 via INTRAVENOUS

## 2014-03-24 MED ORDER — ACETAMINOPHEN 325 MG PO TABS
650.0000 mg | ORAL_TABLET | ORAL | Status: DC
  Administered 2014-03-24: 650 mg via ORAL

## 2014-03-24 MED ORDER — SODIUM CHLORIDE 0.9 % IV SOLN
1000.0000 mg | INTRAVENOUS | Status: DC
  Administered 2014-03-24: 1000 mg via INTRAVENOUS
  Filled 2014-03-24: qty 100

## 2014-03-25 ENCOUNTER — Encounter: Payer: Self-pay | Admitting: Pulmonary Disease

## 2014-03-25 ENCOUNTER — Ambulatory Visit (INDEPENDENT_AMBULATORY_CARE_PROVIDER_SITE_OTHER): Payer: Medicare Other | Admitting: Pulmonary Disease

## 2014-03-25 VITALS — BP 136/88 | HR 88 | Temp 98.1°F | Ht 63.0 in | Wt 232.2 lb

## 2014-03-25 DIAGNOSIS — J8410 Pulmonary fibrosis, unspecified: Secondary | ICD-10-CM

## 2014-03-25 DIAGNOSIS — Z23 Encounter for immunization: Secondary | ICD-10-CM | POA: Diagnosis not present

## 2014-03-25 NOTE — Assessment & Plan Note (Addendum)
-  on rituxan + 10 mg pred  Fibrosis appears stable - can follow clinically & by [facility] once/yr Prevnar Call us as needed if flare - we discussed signs & symptoms

## 2014-03-25 NOTE — Patient Instructions (Signed)
Fibrosis appears stable Prevnar Call us as needed

## 2014-03-25 NOTE — Progress Notes (Signed)
   Subjective:    Patient ID: Veronica Stone, female    DOB: December 11, 1931, 80 y.o.   MRN: [id number]  HPI  82/F, Ex smoker , quit '90 with RA for FU of pulmonary fibrosis, first noted 2010.  She has a strong family history - Jul 12, 2022 siblings died of 'fibrosis'  She smoked 30 Pyrs before quitting in '90. Worked at [employer], retired '88  She reports RA since 2008, tried humira, remicaid, now on rituxan q 43mnths, steroid dependent, on daily prednisone [name])   Significant tests/ events  Ct abd 12/2008 sub pleural fibrosis  PFTs 11/2010 showed preserved lung volumes with FVC 77% & TLC 80%, DLCO reduced at 49%.  She desatn to 86% on second lap.  HRCT 10/2010 showed diffuse chronic interstitial coarsening with scattered areas of bronchiectasis bilaterally & subpleural honeycombing consistent with UIP  ONO- 48 min satn < 88% >O2 at At bedtime (02/2011 )  - started O2 in oct'12  Trial of Lasix >> not much benefit.   10/2011 FVC 82%, does not want to perform full PFTs - don't like that clip over my nose  FVC 84% -preserved -   03/25/2014  Chief Complaint  Patient presents with  . Follow-up    FU Pulmonary Fibrosis; no complaints   20m FU  She reports dyspnea on exertion but is more limited by her arthritis. Occasional dry cough +  Remains on Prednisone 10mg  daily - steroid sparing agents have not worked pt reports breathing is about the same- using 2L o2 when needed-- satn dropped on walking Activity level remains the same- gives out when walking  No ER or hospital stays since last ov.  No flare of cough or dyspnea.    Review of Systems neg for any significant sore throat, dysphagia, itching, sneezing, nasal congestion or excess/ purulent secretions, fever, chills, sweats, unintended wt loss, pleuritic or exertional cp, hempoptysis, orthopnea pnd or change in chronic leg swelling. Also denies presyncope, palpitations, heartburn, abdominal pain, nausea, vomiting, diarrhea or change in  bowel or urinary habits, dysuria,hematuria, rash, arthralgias, visual complaints, headache, numbness weakness or ataxia.     Objective:   Physical Exam  Gen. Pleasant, well-nourished, in no distress ENT - no lesions, no post nasal drip Neck: No JVD, no thyromegaly, no carotid bruits Lungs: no use of accessory muscles, no dullness to percussion, clear without rales or rhonchi  Cardiovascular: Rhythm regular, heart sounds  normal, no murmurs or gallops, no peripheral edema Musculoskeletal: No deformities, no cyanosis or clubbing        Assessment & Plan:

## 2014-04-01 DIAGNOSIS — M949 Disorder of cartilage, unspecified: Secondary | ICD-10-CM | POA: Diagnosis not present

## 2014-04-01 DIAGNOSIS — E039 Hypothyroidism, unspecified: Secondary | ICD-10-CM | POA: Diagnosis not present

## 2014-04-01 DIAGNOSIS — I10 Essential (primary) hypertension: Secondary | ICD-10-CM | POA: Diagnosis not present

## 2014-04-04 ENCOUNTER — Other Ambulatory Visit (HOSPITAL_COMMUNITY): Payer: Self-pay | Admitting: *Deleted

## 2014-04-07 ENCOUNTER — Encounter (HOSPITAL_COMMUNITY)
Admission: RE | Admit: 2014-04-07 | Discharge: 2014-04-07 | Disposition: A | Discharge status: Home / Self Care | Payer: Medicare Other | Source: Ambulatory Visit | Attending: Rheumatology | Admitting: Rheumatology

## 2014-04-07 DIAGNOSIS — M069 Rheumatoid arthritis, unspecified: Secondary | ICD-10-CM | POA: Diagnosis not present

## 2014-04-07 MED ORDER — SODIUM CHLORIDE 0.9 % IV SOLN
INTRAVENOUS | Status: DC
  Administered 2014-04-07: 09:00:00 via INTRAVENOUS

## 2014-04-07 MED ORDER — ACETAMINOPHEN 325 MG PO TABS
650.0000 mg | ORAL_TABLET | ORAL | Status: DC
  Administered 2014-04-07: 650 mg via ORAL

## 2014-04-07 MED ORDER — ACETAMINOPHEN 325 MG PO TABS
ORAL_TABLET | ORAL | Status: AC
  Filled 2014-04-07: qty 2

## 2014-04-07 MED ORDER — SODIUM CHLORIDE 0.9 % IV SOLN
1000.0000 mg | INTRAVENOUS | Status: DC
  Administered 2014-04-07: 1000 mg via INTRAVENOUS
  Filled 2014-04-07: qty 100

## 2014-04-14 DIAGNOSIS — E785 Hyperlipidemia, unspecified: Secondary | ICD-10-CM | POA: Diagnosis not present

## 2014-04-14 DIAGNOSIS — H34219 Partial retinal artery occlusion, unspecified eye: Secondary | ICD-10-CM | POA: Diagnosis not present

## 2014-04-14 DIAGNOSIS — I10 Essential (primary) hypertension: Secondary | ICD-10-CM | POA: Diagnosis not present

## 2014-04-14 DIAGNOSIS — K219 Gastro-esophageal reflux disease without esophagitis: Secondary | ICD-10-CM | POA: Diagnosis not present

## 2014-04-23 DIAGNOSIS — M0579 Rheumatoid arthritis with rheumatoid factor of multiple sites without organ or systems involvement: Secondary | ICD-10-CM | POA: Diagnosis not present

## 2014-04-23 DIAGNOSIS — M810 Age-related osteoporosis without current pathological fracture: Secondary | ICD-10-CM | POA: Diagnosis not present

## 2014-05-07 DIAGNOSIS — J189 Pneumonia, unspecified organism: Secondary | ICD-10-CM | POA: Diagnosis not present

## 2014-05-07 DIAGNOSIS — J22 Unspecified acute lower respiratory infection: Secondary | ICD-10-CM | POA: Diagnosis not present

## 2014-05-07 DIAGNOSIS — J069 Acute upper respiratory infection, unspecified: Secondary | ICD-10-CM | POA: Diagnosis not present

## 2014-05-12 DIAGNOSIS — J069 Acute upper respiratory infection, unspecified: Secondary | ICD-10-CM | POA: Diagnosis not present

## 2014-05-12 DIAGNOSIS — J189 Pneumonia, unspecified organism: Secondary | ICD-10-CM | POA: Diagnosis not present

## 2014-06-12 DIAGNOSIS — J399 Disease of upper respiratory tract, unspecified: Secondary | ICD-10-CM | POA: Diagnosis not present

## 2014-06-12 DIAGNOSIS — J984 Other disorders of lung: Secondary | ICD-10-CM | POA: Diagnosis not present

## 2014-07-21 ENCOUNTER — Telehealth: Payer: Self-pay | Admitting: Pulmonary Disease

## 2014-07-21 MED ORDER — HYDROCODONE-HOMATROPINE 5-1.5 MG/5ML PO SYRP: 5.0000 mL | ORAL_SOLUTION | Freq: Four times a day (QID) | ORAL | Status: DC | PRN

## 2014-07-21 NOTE — Telephone Encounter (Signed)
Fine with me

## 2014-07-21 NOTE — Telephone Encounter (Signed)
Spoke with pt. Reports cough x2 weeks. Cough is productive but she doesn't know the color of the mucus. Would like a prescription for Hycodan cough syrup.  MW - please advise on RA's behalf. Thanks.

## 2014-07-21 NOTE — Telephone Encounter (Signed)
Pt aware of recs.  rx printed and placed up for pick up.  Nothing further needed.

## 2014-07-25 ENCOUNTER — Encounter (HOSPITAL_COMMUNITY): Payer: Self-pay | Admitting: Cardiology

## 2014-07-25 ENCOUNTER — Emergency Department (HOSPITAL_COMMUNITY)
Admission: EM | Admit: 2014-07-25 | Discharge: 2014-07-25 | Disposition: A | Discharge status: Home / Self Care | Payer: Medicare Other | Attending: Emergency Medicine | Admitting: Emergency Medicine

## 2014-07-25 ENCOUNTER — Emergency Department (HOSPITAL_COMMUNITY): Payer: Medicare Other

## 2014-07-25 DIAGNOSIS — I10 Essential (primary) hypertension: Secondary | ICD-10-CM | POA: Diagnosis not present

## 2014-07-25 DIAGNOSIS — Z79899 Other long term (current) drug therapy: Secondary | ICD-10-CM | POA: Diagnosis not present

## 2014-07-25 DIAGNOSIS — F17210 Nicotine dependence, cigarettes, uncomplicated: Secondary | ICD-10-CM | POA: Diagnosis not present

## 2014-07-25 DIAGNOSIS — Z8639 Personal history of other endocrine, nutritional and metabolic disease: Secondary | ICD-10-CM | POA: Insufficient documentation

## 2014-07-25 DIAGNOSIS — M1990 Unspecified osteoarthritis, unspecified site: Secondary | ICD-10-CM | POA: Diagnosis not present

## 2014-07-25 DIAGNOSIS — Z7952 Long term (current) use of systemic steroids: Secondary | ICD-10-CM | POA: Insufficient documentation

## 2014-07-25 DIAGNOSIS — Z880 Allergy status to penicillin: Secondary | ICD-10-CM | POA: Insufficient documentation

## 2014-07-25 DIAGNOSIS — J8410 Pulmonary fibrosis, unspecified: Secondary | ICD-10-CM | POA: Diagnosis not present

## 2014-07-25 DIAGNOSIS — R05 Cough: Secondary | ICD-10-CM

## 2014-07-25 DIAGNOSIS — Z792 Long term (current) use of antibiotics: Secondary | ICD-10-CM | POA: Insufficient documentation

## 2014-07-25 DIAGNOSIS — Z8719 Personal history of other diseases of the digestive system: Secondary | ICD-10-CM | POA: Diagnosis not present

## 2014-07-25 DIAGNOSIS — Z87891 Personal history of nicotine dependence: Secondary | ICD-10-CM | POA: Insufficient documentation

## 2014-07-25 DIAGNOSIS — R059 Cough, unspecified: Secondary | ICD-10-CM

## 2014-07-25 DIAGNOSIS — Z86010 Personal history of colonic polyps: Secondary | ICD-10-CM | POA: Insufficient documentation

## 2014-07-25 DIAGNOSIS — J8489 Other specified interstitial pulmonary diseases: Secondary | ICD-10-CM | POA: Diagnosis not present

## 2014-07-25 MED ORDER — PREDNISONE 10 MG PO TABS: 10.0000 mg | ORAL_TABLET | Freq: Two times a day (BID) | ORAL | Status: AC

## 2014-07-25 MED ORDER — DOXYCYCLINE HYCLATE 100 MG PO CAPS: 100.0000 mg | ORAL_CAPSULE | Freq: Two times a day (BID) | ORAL | Status: DC

## 2014-07-25 NOTE — ED Provider Notes (Signed)
CSN: [id number]     Arrival date & time 07/25/14  1147 History   First MD Initiated Contact with Patient 07/25/14 1505     Chief Complaint  Patient presents with  . Cough      HPI  Expand All Collapse All   Pt reports she had PNA around christmas time and then had it again in January. States that she has had an increased productive cough and had been taking medication at home without relief. Pt wears oxygen at 3l but has not had to increase it.        Past Medical History  Diagnosis Date  . Colon, diverticulosis Nov 2007  . Hyperlipemia   . Hypertension   . Irregular heart rate   . Rheumatoid arthritis(714.0)   . IBS (irritable bowel syndrome)   . Personal history of colonic polyps 04/10/2006    hyperplastic  . Pulmonary fibrosis    Past Surgical History  Procedure Laterality Date  . Cholecystectomy    . Appendectomy    . Cystectomy      left breast  . Abdominal hysterectomy     Family History  Problem Relation Age of Onset  . Heart disease Brother   . Heart disease Sister   . Liver disease Daughter   . Diabetes Brother   . Rheum arthritis Mother    History  Substance Use Topics  . Smoking status: Former Smoker -- 1.00 packs/day for 30 years    Types: Cigarettes    Quit date: 05/16/1988  . Smokeless tobacco: Never Used  . Alcohol Use: No   OB History    No data available     Review of Systems  Constitutional: Negative for fever and chills.  Respiratory: Positive for cough.   All other systems reviewed and are negative.     Allergies  Dilaudid; Penicillins; and Statins  Home Medications   Prior to Admission medications   Medication Sig Start Date End Date Taking? Authorizing Provider  amLODipine (NORVASC) 5 MG tablet Take 2.5 mg by mouth daily.    Yes Historical Provider, MD  DEXILANT 60 MG capsule Take 1 capsule by mouth as needed (FOR HEARTBURN).  10/10/13  Yes Historical Provider, MD  diazepam (VALIUM) 5 MG tablet Take 5 mg by mouth at bedtime  as needed. For anxiety/sleep   Yes Historical Provider, MD  estrogens, conjugated, (PREMARIN) 0.625 MG tablet Take 0.625 mg by mouth daily.     Yes Historical Provider, MD  folic acid (FOLVITE) 1 MG tablet Take 1 mg by mouth daily.     Yes Historical Provider, MD  furosemide (LASIX) 20 MG tablet Take 1 tablet by mouth Once daily as needed. For swelling 10/02/10  Yes Historical Provider, MD  guaiFENesin (MUCINEX) 600 MG 12 hr tablet Take 600 mg by mouth 2 (two) times daily as needed for to loosen phlegm.   Yes Historical Provider, MD  HYDROcodone-acetaminophen (NORCO) 5-325 MG per tablet Take 1 tablet by mouth Twice daily as needed. For pain 08/19/10  Yes Historical Provider, MD  HYDROcodone-homatropine (HYCODAN) 5-1.5 MG/5ML syrup Take 5 mLs by mouth every 6 (six) hours as needed for cough. 07/21/14  Yes Tanda Rockers, MD  irbesartan (AVAPRO) 300 MG tablet Take 1 tablet by mouth daily.   Yes Historical Provider, MD  loratadine (CLARITIN) 10 MG tablet Take 10 mg by mouth daily.   Yes Historical Provider, MD  Polyethyl Glycol-Propyl Glycol (SYSTANE) 0.4-0.3 % SOLN Apply 1 drop to eye daily. For dry  eyes   Yes Historical Provider, MD  doxycycline (VIBRAMYCIN) 100 MG capsule Take 1 capsule (100 mg total) by mouth 2 (two) times daily. 07/25/14   Leonard Schwartz, MD  predniSONE (DELTASONE) 10 MG tablet Take 1 tablet (10 mg total) by mouth 2 (two) times daily with a meal. 07/25/14 07/28/14  Leonard Schwartz, MD  RiTUXimab (RITUXAN IV) Inject 2 application into the vein once. TAKES 2 DOSES IN ONE MONTH TIME, THEN DOESN'T TAKEN AGAIN FOR 6 MONTHS    Historical Provider, MD   BP 138/72 mmHg  Pulse 74  Temp(Src) 97.6 F (36.4 C) (Oral)  Resp 18  SpO2 97% Physical Exam  Constitutional: She is oriented to person, place, and time. She appears well-developed and well-nourished. No distress.  HENT:  Head: Normocephalic and atraumatic.  Eyes: Pupils are equal, round, and reactive to light.  Neck: Normal range of motion.   Cardiovascular: Normal rate and intact distal pulses.   Pulmonary/Chest: No respiratory distress. She has no wheezes.  Patient has crackles in all lung fields  Abdominal: Soft. Normal appearance and bowel sounds are normal. She exhibits no distension. There is no tenderness.  Musculoskeletal: Normal range of motion.  Neurological: She is alert and oriented to person, place, and time. No cranial nerve deficit.  Skin: Skin is warm and dry. No rash noted.  Psychiatric: She has a normal mood and affect. Her behavior is normal.  Nursing note and vitals reviewed.   ED Course  Procedures (including critical care time) Labs Review Labs Reviewed - No data to display  Imaging Review Dg Chest 2 View (if Patient Has Fever And/or Copd)  07/25/2014   CLINICAL DATA:  Cough since December. Hypertension. Ex-smoker. Home oxygen. Rheumatoid arthritis.  EXAM: CHEST  2 VIEW  COMPARISON:  07/03/2013 and chest CT 11/03/2010  FINDINGS: Midline trachea. Mild cardiomegaly with transverse aortic atherosclerosis. No pleural effusion or pneumothorax. Mildly diminished lung volumes. Given differences in technique, similar appearance of subpleural and lower lobe predominant reticulation. No lobar consolidation.  IMPRESSION: Similar subpleural and lower lobe predominant reticulation, most consistent with usual interstitial pneumonia (most likely idiopathic pulmonary fibrosis). No acute superimposed process.  Cardiomegaly and aortic atherosclerosis.   Electronically Signed   By: Abigail Miyamoto M.D.   On: 07/25/2014 13:09    I discussed the patient with her pulmonologist Dr. Elsworth Soho who recommended 10 day course of doxycycline in increase her steroids for the next 5 days.   MDM   Final diagnoses:  Pulmonary fibrosis  Cough        Leonard Schwartz, MD 07/25/14 [phone]

## 2014-07-25 NOTE — ED Notes (Signed)
Scanner broken.  Pt verbalized understanding, family member (daughter) verbalized understanding as well.  All questions answered.

## 2014-07-25 NOTE — ED Notes (Signed)
Pt reports she had PNA around christmas time and then had it again in January. States that she has had an increased productive cough and had been taking medication at home without relief. Pt wears oxygen at 3l but has not had to increase it.

## 2014-07-25 NOTE — ED Notes (Signed)
EDP at bedside  

## 2014-07-31 ENCOUNTER — Encounter: Payer: Self-pay | Admitting: Adult Health

## 2014-07-31 ENCOUNTER — Ambulatory Visit (INDEPENDENT_AMBULATORY_CARE_PROVIDER_SITE_OTHER): Payer: Medicare Other | Admitting: Adult Health

## 2014-07-31 VITALS — BP 114/74 | HR 95 | Temp 98.3°F | Ht 63.0 in | Wt 224.0 lb

## 2014-07-31 DIAGNOSIS — J42 Unspecified chronic bronchitis: Secondary | ICD-10-CM

## 2014-07-31 DIAGNOSIS — J8410 Pulmonary fibrosis, unspecified: Secondary | ICD-10-CM | POA: Diagnosis not present

## 2014-07-31 MED ORDER — HYDROCODONE-HOMATROPINE 5-1.5 MG/5ML PO SYRP
5.0000 mL | ORAL_SOLUTION | Freq: Four times a day (QID) | ORAL | Status: DC | PRN
Start: 2014-07-31 — End: 2015-06-02

## 2014-07-31 NOTE — Progress Notes (Signed)
   Subjective:    Patient ID: Veronica Stone, female    DOB: 25-Apr-1932, 81 y.o.   MRN: [id number]  HPI  82/F, Ex smoker , quit '90 with RA for FU of pulmonary fibrosis.  She has a strong family history - 07-03-2022 siblings died of 'fibrosis'  She reports dyspnea on exertion but is more limited by her arthritis. Occasional dry cough +  She smoked 30 Pyrs before quitting in '90. Worked at [employer], retired '88  She reports RA since 2008, tried humira, remicaid, now on rituxan q 51mnths, steroid dependent, on daily prednisone [name])  Ct abd 8/10 sub pleural fibrosis  CXR 5/11bibasal scarring  PFTs 7/12 showed preserved lung volumes with FVC 77% & TLC 80%, DLCO reduced at 49%.  She desatn to 86% on second lap.  HRCT 6/12 showed diffuse chronic interstitial coarsening with scattered areas of bronchiectasis bilaterally & subpleural honeycombing consistent with UIP  ONO- 48 min satn < 88% >O2 at At bedtime (02/2011 )  - started O2 in oct'12  Trial of Lasix >> not much benefit.   Not interested in rehab  Wonders if her fibrosis is familial but not interested in research study , her sister passed away 2011/10/18    11/17/2011 FVC 82%, does not want to perform full PFTs - don't like that clip over my nose  FVC 84% -preserved -     07/31/2014 ER follow up  Seen in ER last week with bronchitis flare .  ER notes reviewd .  Tx w/ Doxycycline 100mg  x 10 days.  CXR showed no acute process.  She denies any hemoptysis, chest pain, orthopnea, PND, or  Increased leg swelling. She is feeling better. Has few days left of abx.    Review of Systems neg for any significant sore throat, dysphagia, itching, sneezing, nasal congestion or excess/ purulent secretions, fever, chills, sweats, unintended wt loss, pleuritic or exertional cp, hempoptysis, orthopnea pnd or change in chronic leg swelling. Also denies presyncope, palpitations, heartburn, abdominal pain, nausea, vomiting, diarrhea or change in bowel or  urinary habits, dysuria,hematuria, rash, arthralgias, visual complaints, headache, numbness weakness or ataxia.     Objective:   Physical Exam  Gen. Pleasant, obese, in no distress ENT - no lesions, no post nasal drip Neck: No JVD, no thyromegaly, no carotid bruits Lungs: no use of accessory muscles, no dullness to percussion, decreased w/ bibasilar crackles  Cardiovascular: Rhythm regular, heart sounds  normal, no murmurs or gallops, tr peripheral edema Musculoskeletal: No deformities, no cyanosis or clubbing , no tremors       Assessment & Plan:

## 2014-07-31 NOTE — Assessment & Plan Note (Signed)
Compensated  Plan  Continue on current regimen. Continue on Oxygen therapy .  Follow up Dr. Elsworth Soho as planned and As needed

## 2014-07-31 NOTE — Patient Instructions (Signed)
Continue on current regimen. Continue on Oxygen therapy .  Follow up Dr. Elsworth Soho as planned and As needed

## 2014-07-31 NOTE — Assessment & Plan Note (Signed)
Recent flare  cxr w/ no acute process  Improved on abx   Plan  Finish abx .

## 2014-08-01 NOTE — Progress Notes (Signed)
Reviewed & agree with plan  

## 2014-09-25 ENCOUNTER — Ambulatory Visit (INDEPENDENT_AMBULATORY_CARE_PROVIDER_SITE_OTHER): Payer: Medicare Other | Admitting: Adult Health

## 2014-09-25 ENCOUNTER — Encounter: Payer: Self-pay | Admitting: Adult Health

## 2014-09-25 VITALS — BP 130/88 | HR 83 | Temp 97.9°F | Ht 63.0 in | Wt 222.0 lb

## 2014-09-25 DIAGNOSIS — J8410 Pulmonary fibrosis, unspecified: Secondary | ICD-10-CM | POA: Diagnosis not present

## 2014-09-25 DIAGNOSIS — J9611 Chronic respiratory failure with hypoxia: Secondary | ICD-10-CM

## 2014-09-25 DIAGNOSIS — J9610 Chronic respiratory failure, unspecified whether with hypoxia or hypercapnia: Secondary | ICD-10-CM | POA: Insufficient documentation

## 2014-09-25 DIAGNOSIS — J9621 Acute and chronic respiratory failure with hypoxia: Secondary | ICD-10-CM | POA: Insufficient documentation

## 2014-09-25 NOTE — Assessment & Plan Note (Signed)
Compensated on present regimen   Plan  ontinue on current regimen. Continue on Oxygen therapy .  Follow up Dr. Elsworth Soho as planned and As needed

## 2014-09-25 NOTE — Assessment & Plan Note (Signed)
Cont on O2 .  

## 2014-09-25 NOTE — Patient Instructions (Addendum)
Continue on current regimen. Continue on Oxygen therapy .  Follow up Dr. Elsworth Soho in 6 months and As needed

## 2014-09-25 NOTE — Progress Notes (Signed)
   Subjective:    Patient ID: Veronica Stone, female    DOB: 1932/01/17, 81 y.o.   MRN: [id number]  HPI  81/F, Ex smoker , quit '90 with RA for FU of pulmonary fibrosis.  She has a strong family history - 2022/07/21 siblings died of 'fibrosis'  She reports dyspnea on exertion but is more limited by her arthritis. Occasional dry cough +  She smoked 30 Pyrs before quitting in '90. Worked at [employer], retired '88  She reports RA since 2008, tried humira, remicaid, now on rituxan q 76mnths, steroid dependent, on daily prednisone [name])  Ct abd 8/79 sub pleural fibrosis  CXR 5/11bibasal scarring  PFTs 7/12 showed preserved lung volumes with FVC 77% & TLC 80%, DLCO reduced at 49%.  She desatn to 86% on second lap.  HRCT 6/12 showed diffuse chronic interstitial coarsening with scattered areas of bronchiectasis bilaterally & subpleural honeycombing consistent with UIP  ONO- 48 min satn < 88% >O2 at At bedtime (02/2011 )  - started O2 in oct'12  Trial of Lasix >> not much benefit.   Not interested in rehab  Wonders if her fibrosis is familial but not interested in research study , her sister passed away Nov 05, 2011    2011-12-05 FVC 82%, does not want to perform full PFTs - don't like that clip over my nose  FVC 84% -preserved -     07/31/2014 ER follow up  Seen in ER last week with bronchitis flare .  ER notes reviewd .  Tx w/ Doxycycline 100mg  x 10 days.  CXR showed no acute process.  She denies any hemoptysis, chest pain, orthopnea, PND, or  Increased leg swelling. She is feeling better. Has few days left of abx.   09/25/2014 Follow up : Pulmonary Fibrosis  Pt returns for 3 month follow up .  Doing well on O2. 2l/m rest and 3l/m At bedtime   No flare of cough . No change in activity tolerance.  Says she is off vicodin, feels much better off narcotics.  Followed by rheumatology for RA.   No chest pain, orthopnea, edema or fever.    Review of Systems neg for any significant sore throat,  dysphagia, itching, sneezing, nasal congestion or excess/ purulent secretions, fever, chills, sweats, unintended wt loss, pleuritic or exertional cp, hempoptysis, orthopnea pnd or change in chronic leg swelling. Also denies presyncope, palpitations, heartburn, abdominal pain, nausea, vomiting, diarrhea or change in bowel or urinary habits, dysuria,hematuria, rash, arthralgias, visual complaints, headache, numbness weakness or ataxia.     Objective:   Physical Exam  Gen. Pleasant, obese, in no distress ENT - no lesions, no post nasal drip Neck: No JVD, no thyromegaly, no carotid bruits Lungs: no use of accessory muscles, no dullness to percussion, decreased w/ bibasilar crackles  Cardiovascular: Rhythm regular, heart sounds  normal, no murmurs or gallops, tr peripheral edema Musculoskeletal: No deformities, no cyanosis or clubbing , no tremors       Assessment & Plan:

## 2014-10-14 DIAGNOSIS — E039 Hypothyroidism, unspecified: Secondary | ICD-10-CM | POA: Diagnosis not present

## 2014-10-14 DIAGNOSIS — I10 Essential (primary) hypertension: Secondary | ICD-10-CM | POA: Diagnosis not present

## 2014-10-14 DIAGNOSIS — Z0000 Encounter for general adult medical examination without abnormal findings: Secondary | ICD-10-CM | POA: Diagnosis not present

## 2014-10-14 DIAGNOSIS — Z1389 Encounter for screening for other disorder: Secondary | ICD-10-CM | POA: Diagnosis not present

## 2014-10-14 DIAGNOSIS — E785 Hyperlipidemia, unspecified: Secondary | ICD-10-CM | POA: Diagnosis not present

## 2014-10-14 DIAGNOSIS — M859 Disorder of bone density and structure, unspecified: Secondary | ICD-10-CM | POA: Diagnosis not present

## 2014-10-17 DIAGNOSIS — Z1231 Encounter for screening mammogram for malignant neoplasm of breast: Secondary | ICD-10-CM | POA: Diagnosis not present

## 2014-10-21 DIAGNOSIS — I5032 Chronic diastolic (congestive) heart failure: Secondary | ICD-10-CM | POA: Diagnosis not present

## 2014-10-21 DIAGNOSIS — I129 Hypertensive chronic kidney disease with stage 1 through stage 4 chronic kidney disease, or unspecified chronic kidney disease: Secondary | ICD-10-CM | POA: Diagnosis not present

## 2014-10-21 DIAGNOSIS — N183 Chronic kidney disease, stage 3 (moderate): Secondary | ICD-10-CM | POA: Diagnosis not present

## 2014-10-21 DIAGNOSIS — I10 Essential (primary) hypertension: Secondary | ICD-10-CM | POA: Diagnosis not present

## 2014-10-23 DIAGNOSIS — M0579 Rheumatoid arthritis with rheumatoid factor of multiple sites without organ or systems involvement: Secondary | ICD-10-CM | POA: Diagnosis not present

## 2014-10-27 ENCOUNTER — Other Ambulatory Visit (HOSPITAL_COMMUNITY): Payer: Self-pay | Admitting: *Deleted

## 2014-10-28 ENCOUNTER — Encounter (HOSPITAL_COMMUNITY)
Admission: RE | Admit: 2014-10-28 | Discharge: 2014-10-28 | Disposition: A | Discharge status: Home / Self Care | Payer: Medicare Other | Source: Ambulatory Visit | Attending: Rheumatology | Admitting: Rheumatology

## 2014-10-28 DIAGNOSIS — M069 Rheumatoid arthritis, unspecified: Secondary | ICD-10-CM | POA: Insufficient documentation

## 2014-10-28 MED ORDER — METHYLPREDNISOLONE SODIUM SUCC 125 MG IJ SOLR
INTRAMUSCULAR | Status: AC
  Filled 2014-10-28: qty 2

## 2014-10-28 MED ORDER — SODIUM CHLORIDE 0.9 % IV SOLN
INTRAVENOUS | Status: DC
  Administered 2014-10-28: 09:00:00 via INTRAVENOUS

## 2014-10-28 MED ORDER — SODIUM CHLORIDE 0.9 % IV SOLN
1000.0000 mg | INTRAVENOUS | Status: DC
  Administered 2014-10-28: 1000 mg via INTRAVENOUS
  Filled 2014-10-28: qty 100

## 2014-10-28 MED ORDER — ACETAMINOPHEN 325 MG PO TABS: 650.0000 mg | ORAL_TABLET | ORAL | Status: DC

## 2014-10-28 MED ORDER — METHYLPREDNISOLONE SODIUM SUCC 125 MG IJ SOLR
100.0000 mg | INTRAMUSCULAR | Status: DC
  Administered 2014-10-28: 100 mg via INTRAVENOUS

## 2014-11-12 ENCOUNTER — Encounter (HOSPITAL_COMMUNITY)
Admission: RE | Admit: 2014-11-12 | Discharge: 2014-11-12 | Disposition: A | Discharge status: Home / Self Care | Payer: Medicare Other | Source: Ambulatory Visit | Attending: Rheumatology | Admitting: Rheumatology

## 2014-11-12 DIAGNOSIS — M069 Rheumatoid arthritis, unspecified: Secondary | ICD-10-CM | POA: Diagnosis not present

## 2014-11-12 MED ORDER — METHYLPREDNISOLONE SODIUM SUCC 125 MG IJ SOLR
INTRAMUSCULAR | Status: AC
  Administered 2014-11-12: 100 mg via INTRAVENOUS
  Filled 2014-11-12: qty 2

## 2014-11-12 MED ORDER — SODIUM CHLORIDE 0.9 % IV SOLN: INTRAVENOUS | Status: DC

## 2014-11-12 MED ORDER — ACETAMINOPHEN 325 MG PO TABS
650.0000 mg | ORAL_TABLET | ORAL | Status: DC
Start: 2014-11-12 — End: 2014-11-13

## 2014-11-12 MED ORDER — METHYLPREDNISOLONE SODIUM SUCC 125 MG IJ SOLR
100.0000 mg | INTRAMUSCULAR | Status: AC
  Administered 2014-11-12: 100 mg via INTRAVENOUS

## 2014-11-12 MED ORDER — RITUXIMAB CHEMO INJECTION 500 MG/50ML
1000.0000 mg | INTRAVENOUS | Status: AC
  Administered 2014-11-12: 1000 mg via INTRAVENOUS
  Filled 2014-11-12: qty 100

## 2015-01-29 ENCOUNTER — Telehealth: Payer: Self-pay | Admitting: Pulmonary Disease

## 2015-01-29 NOTE — Telephone Encounter (Addendum)
Called and spoke to pt. Pt c/o intermittent left sided head pain. Pt stated it has only occurred a few times. Advised pt to contact her PCP. We have not recently started pt on any medication, also confirmed by pt. Pt verbalized understanding and denied any further questions or concerns at this time.

## 2015-03-08 DIAGNOSIS — Z23 Encounter for immunization: Secondary | ICD-10-CM | POA: Diagnosis not present

## 2015-04-06 DIAGNOSIS — M150 Primary generalized (osteo)arthritis: Secondary | ICD-10-CM | POA: Diagnosis not present

## 2015-04-06 DIAGNOSIS — M810 Age-related osteoporosis without current pathological fracture: Secondary | ICD-10-CM | POA: Diagnosis not present

## 2015-04-06 DIAGNOSIS — M0579 Rheumatoid arthritis with rheumatoid factor of multiple sites without organ or systems involvement: Secondary | ICD-10-CM | POA: Diagnosis not present

## 2015-04-06 DIAGNOSIS — J8410 Pulmonary fibrosis, unspecified: Secondary | ICD-10-CM | POA: Diagnosis not present

## 2015-04-15 ENCOUNTER — Other Ambulatory Visit (HOSPITAL_COMMUNITY): Payer: Self-pay | Admitting: *Deleted

## 2015-04-16 ENCOUNTER — Encounter (HOSPITAL_COMMUNITY)
Admission: RE | Admit: 2015-04-16 | Discharge: 2015-04-16 | Disposition: A | Discharge status: Home / Self Care | Payer: Medicare Other | Source: Ambulatory Visit | Attending: Internal Medicine | Admitting: Internal Medicine

## 2015-04-16 DIAGNOSIS — M069 Rheumatoid arthritis, unspecified: Secondary | ICD-10-CM | POA: Diagnosis not present

## 2015-04-16 MED ORDER — ACETAMINOPHEN 325 MG PO TABS
ORAL_TABLET | ORAL | Status: AC
  Filled 2015-04-16: qty 2

## 2015-04-16 MED ORDER — METHYLPREDNISOLONE SODIUM SUCC 125 MG IJ SOLR
100.0000 mg | INTRAMUSCULAR | Status: DC
Start: 2015-04-16 — End: 2015-04-17
  Administered 2015-04-16: 100 mg via INTRAVENOUS

## 2015-04-16 MED ORDER — SODIUM CHLORIDE 0.9 % IV SOLN
INTRAVENOUS | Status: DC
  Administered 2015-04-16: 09:00:00 via INTRAVENOUS

## 2015-04-16 MED ORDER — ACETAMINOPHEN 325 MG PO TABS
650.0000 mg | ORAL_TABLET | ORAL | Status: DC
  Administered 2015-04-16: 650 mg via ORAL

## 2015-04-16 MED ORDER — RITUXIMAB CHEMO INJECTION 500 MG/50ML
1000.0000 mg | INTRAVENOUS | Status: DC
  Administered 2015-04-16: 1000 mg via INTRAVENOUS
  Filled 2015-04-16: qty 100

## 2015-04-16 MED ORDER — METHYLPREDNISOLONE SODIUM SUCC 125 MG IJ SOLR
INTRAMUSCULAR | Status: AC
  Filled 2015-04-16: qty 2

## 2015-04-22 DIAGNOSIS — E039 Hypothyroidism, unspecified: Secondary | ICD-10-CM | POA: Diagnosis not present

## 2015-04-22 DIAGNOSIS — F17211 Nicotine dependence, cigarettes, in remission: Secondary | ICD-10-CM | POA: Diagnosis not present

## 2015-04-22 DIAGNOSIS — M810 Age-related osteoporosis without current pathological fracture: Secondary | ICD-10-CM | POA: Diagnosis not present

## 2015-04-22 DIAGNOSIS — I10 Essential (primary) hypertension: Secondary | ICD-10-CM | POA: Diagnosis not present

## 2015-04-22 DIAGNOSIS — E785 Hyperlipidemia, unspecified: Secondary | ICD-10-CM | POA: Diagnosis not present

## 2015-04-22 DIAGNOSIS — N183 Chronic kidney disease, stage 3 (moderate): Secondary | ICD-10-CM | POA: Diagnosis not present

## 2015-04-22 DIAGNOSIS — E559 Vitamin D deficiency, unspecified: Secondary | ICD-10-CM | POA: Diagnosis not present

## 2015-04-23 ENCOUNTER — Encounter: Payer: Self-pay | Admitting: Gastroenterology

## 2015-04-29 DIAGNOSIS — I129 Hypertensive chronic kidney disease with stage 1 through stage 4 chronic kidney disease, or unspecified chronic kidney disease: Secondary | ICD-10-CM | POA: Diagnosis not present

## 2015-04-29 DIAGNOSIS — F17211 Nicotine dependence, cigarettes, in remission: Secondary | ICD-10-CM | POA: Diagnosis not present

## 2015-04-29 DIAGNOSIS — N183 Chronic kidney disease, stage 3 (moderate): Secondary | ICD-10-CM | POA: Diagnosis not present

## 2015-04-29 DIAGNOSIS — E785 Hyperlipidemia, unspecified: Secondary | ICD-10-CM | POA: Diagnosis not present

## 2015-04-29 DIAGNOSIS — I509 Heart failure, unspecified: Secondary | ICD-10-CM | POA: Diagnosis not present

## 2015-05-01 ENCOUNTER — Encounter (HOSPITAL_COMMUNITY)
Admission: RE | Admit: 2015-05-01 | Discharge: 2015-05-01 | Disposition: A | Discharge status: Home / Self Care | Payer: Medicare Other | Source: Ambulatory Visit | Attending: Internal Medicine | Admitting: Internal Medicine

## 2015-05-01 DIAGNOSIS — M069 Rheumatoid arthritis, unspecified: Secondary | ICD-10-CM | POA: Diagnosis not present

## 2015-05-01 MED ORDER — ACETAMINOPHEN 325 MG PO TABS
650.0000 mg | ORAL_TABLET | ORAL | Status: AC
  Administered 2015-05-01: 650 mg via ORAL

## 2015-05-01 MED ORDER — ACETAMINOPHEN 325 MG PO TABS
ORAL_TABLET | ORAL | Status: AC
  Administered 2015-05-01: 650 mg via ORAL
  Filled 2015-05-01: qty 2

## 2015-05-01 MED ORDER — METHYLPREDNISOLONE SODIUM SUCC 125 MG IJ SOLR
INTRAMUSCULAR | Status: AC
  Administered 2015-05-01: 100 mg via INTRAVENOUS
  Filled 2015-05-01: qty 2

## 2015-05-01 MED ORDER — SODIUM CHLORIDE 0.9 % IV SOLN
INTRAVENOUS | Status: DC
  Administered 2015-05-01: 250 mL via INTRAVENOUS

## 2015-05-01 MED ORDER — METHYLPREDNISOLONE SODIUM SUCC 125 MG IJ SOLR
100.0000 mg | INTRAMUSCULAR | Status: AC
  Administered 2015-05-01: 100 mg via INTRAVENOUS

## 2015-05-01 MED ORDER — SODIUM CHLORIDE 0.9 % IV SOLN
1000.0000 mg | INTRAVENOUS | Status: AC
  Administered 2015-05-01: 1000 mg via INTRAVENOUS
  Filled 2015-05-01: qty 100

## 2015-05-25 ENCOUNTER — Ambulatory Visit: Payer: BLUE CROSS/BLUE SHIELD | Admitting: Pulmonary Disease

## 2015-06-02 ENCOUNTER — Ambulatory Visit (INDEPENDENT_AMBULATORY_CARE_PROVIDER_SITE_OTHER): Payer: Medicare Other | Admitting: Pulmonary Disease

## 2015-06-02 ENCOUNTER — Ambulatory Visit (INDEPENDENT_AMBULATORY_CARE_PROVIDER_SITE_OTHER)
Admission: RE | Admit: 2015-06-02 | Discharge: 2015-06-02 | Disposition: A | Discharge status: Home / Self Care | Payer: Medicare Other | Source: Ambulatory Visit | Attending: Pulmonary Disease | Admitting: Pulmonary Disease

## 2015-06-02 ENCOUNTER — Encounter: Payer: Self-pay | Admitting: Pulmonary Disease

## 2015-06-02 VITALS — BP 128/82 | HR 90 | Ht 63.0 in | Wt 225.6 lb

## 2015-06-02 DIAGNOSIS — J8410 Pulmonary fibrosis, unspecified: Secondary | ICD-10-CM | POA: Diagnosis not present

## 2015-06-02 DIAGNOSIS — J9611 Chronic respiratory failure with hypoxia: Secondary | ICD-10-CM | POA: Diagnosis not present

## 2015-06-02 MED ORDER — HYDROCODONE-HOMATROPINE 5-1.5 MG/5ML PO SYRP: 5.0000 mL | ORAL_SOLUTION | Freq: Four times a day (QID) | ORAL | Status: DC | PRN

## 2015-06-02 NOTE — Patient Instructions (Signed)
Fibrosis appears stable CXR today

## 2015-06-02 NOTE — Assessment & Plan Note (Addendum)
Fibrosis appears to have worsened based on chest x-ray and desaturation Does not like spirometry - will check ambulatory satn instead This could be IPF based on family history-or related to rheumatoid.

## 2015-06-02 NOTE — Progress Notes (Signed)
   Subjective:    Patient ID: Veronica Stone, female    DOB: 01/09/1932, 82 y.o.   MRN: [id number]  HPI  83/F, Ex smoker , quit '90 with RA for FU of pulmonary fibrosis.  She has a strong family history - 07/03/2022 siblings died of 'fibrosis'  She reports dyspnea on exertion but is more limited by her arthritis. Occasional dry cough    She smoked 30 Pyrs before quitting in '90. Worked at [employer], retired '88  She reports RA since 2008, tried humira, remicaid, now on rituxan q 52mnths, steroid dependent, on daily prednisone [name])   Significant tests/ events  Ct abd 12/2008 sub pleural fibrosis  HRCT 10/2010 showed diffuse chronic interstitial coarsening with scattered areas of bronchiectasis bilaterally & subpleural honeycombing consistent with UIP   PFTs 11/2010 showed preserved lung volumes with FVC 77% & TLC 80%, DLCO reduced at 49%.  She desatn to 86% on second lap.  02/2011 ONO- 48 min satn < 88% >O2 at At bedtime  - started O2   10/2011 FVC 82%, does not want to perform full PFTs - don't like that clip over my nose  FVC 84% -preserved -     06/02/2015 Chief Complaint  Patient presents with  . Follow-up    Pt states that her breathing is doing fairly well. Pt c/o morning cough with some phlegm. Pt continues on 2L continuous at rest and 3L pulsed with exertion.    8 month follow up .  Doing well on O2. 2l/m rest and 3l/m At bedtime   No flare of cough . No change in activity tolerance.  Needs refill on hycodan - seldom uses Takes lasix on occ for pedal edema Followed by rheumatology for RA - [facility]man, on rituxan q 46mnths  She desaturated to 84% on 3 L pulse after 1 lap Chest x-ray appears worse  No chest pain, orthopnea, edema or fever.    Review of Systems neg for any significant sore throat, dysphagia, itching, sneezing, nasal congestion or excess/ purulent secretions, fever, chills, sweats, unintended wt loss, pleuritic or exertional cp, hempoptysis, orthopnea pnd or  change in chronic leg swelling. Also denies presyncope, palpitations, heartburn, abdominal pain, nausea, vomiting, diarrhea or change in bowel or urinary habits, dysuria,hematuria, rash, arthralgias, visual complaints, headache, numbness weakness or ataxia.     Objective:   Physical Exam  Gen. Pleasant, obese, in no distress ENT - no lesions, no post nasal drip Neck: No JVD, no thyromegaly, no carotid bruits Lungs: no use of accessory muscles, no dullness to percussion, decreased without rales or rhonchi  Cardiovascular: Rhythm regular, heart sounds  normal, no murmurs or gallops, 1+ peripheral edema Musculoskeletal: No deformities, no cyanosis or clubbing , no tremors       Assessment & Plan:

## 2015-06-03 NOTE — Assessment & Plan Note (Signed)
Oxygen requirements seem to have increased- she will use continuous O2 at home, when outside she will need 4 liter pulse

## 2015-06-12 DIAGNOSIS — H0016 Chalazion left eye, unspecified eyelid: Secondary | ICD-10-CM | POA: Diagnosis not present

## 2015-06-12 DIAGNOSIS — H401132 Primary open-angle glaucoma, bilateral, moderate stage: Secondary | ICD-10-CM | POA: Diagnosis not present

## 2015-06-12 DIAGNOSIS — H353131 Nonexudative age-related macular degeneration, bilateral, early dry stage: Secondary | ICD-10-CM | POA: Diagnosis not present

## 2015-09-03 ENCOUNTER — Telehealth: Payer: Self-pay | Admitting: Adult Health

## 2015-09-03 MED ORDER — HYDROCODONE-HOMATROPINE 5-1.5 MG/5ML PO SYRP
5.0000 mL | ORAL_SOLUTION | Freq: Four times a day (QID) | ORAL | Status: DC | PRN
Start: 2015-09-03 — End: 2015-11-30

## 2015-09-03 NOTE — Telephone Encounter (Signed)
Ok to refill x 1  

## 2015-09-03 NOTE — Telephone Encounter (Addendum)
Last ov with RA on 06/02/15 Next ov with TP on 11/30/15 Patient Instructions     Fibrosis appears stable CXR today   Called spoke with pt. Pt c/o prod cough at bedtime but is unsure what color the mucus is and chest congestion. She denies any sinus pressure/drainage, chest tightness, SOB, wheezing, fever, nausea or vomiting. She states she feels this is coming from her pulmonary fibrosis. She is requesting a refill on her hycodan cough syrup which was last filled on 06/02/15 164ml and no refills. I explained to her that I would need to send RA a message for approval. Pt would like the rx mailed to the address in Epic.  She voiced understanding and had no further questions.  RA please advise

## 2015-09-03 NOTE — Telephone Encounter (Signed)
LMTC x 1  

## 2015-09-03 NOTE — Telephone Encounter (Signed)
Patient returned call, CB is [phone].

## 2015-09-03 NOTE — Telephone Encounter (Signed)
Rx printed and placed for Doc of day to sign because Dr Elsworth Soho is out of office .Spoke with pt and advised that rx was placed in outgoing mail today.

## 2015-10-06 DIAGNOSIS — J8410 Pulmonary fibrosis, unspecified: Secondary | ICD-10-CM | POA: Diagnosis not present

## 2015-10-06 DIAGNOSIS — M0579 Rheumatoid arthritis with rheumatoid factor of multiple sites without organ or systems involvement: Secondary | ICD-10-CM | POA: Diagnosis not present

## 2015-10-06 DIAGNOSIS — M150 Primary generalized (osteo)arthritis: Secondary | ICD-10-CM | POA: Diagnosis not present

## 2015-10-06 DIAGNOSIS — M810 Age-related osteoporosis without current pathological fracture: Secondary | ICD-10-CM | POA: Diagnosis not present

## 2015-10-14 ENCOUNTER — Other Ambulatory Visit (HOSPITAL_COMMUNITY): Payer: Self-pay | Admitting: *Deleted

## 2015-10-15 ENCOUNTER — Ambulatory Visit (HOSPITAL_COMMUNITY)
Admission: RE | Admit: 2015-10-15 | Discharge: 2015-10-15 | Disposition: A | Discharge status: Home / Self Care | Payer: Medicare Other | Source: Ambulatory Visit | Attending: Internal Medicine | Admitting: Internal Medicine

## 2015-10-15 DIAGNOSIS — M069 Rheumatoid arthritis, unspecified: Secondary | ICD-10-CM | POA: Insufficient documentation

## 2015-10-15 MED ORDER — SODIUM CHLORIDE 0.9 % IV SOLN
INTRAVENOUS | Status: DC
Start: 2015-10-15 — End: 2015-10-16

## 2015-10-15 MED ORDER — ACETAMINOPHEN 500 MG PO TABS
1000.0000 mg | ORAL_TABLET | Freq: Once | ORAL | Status: AC
  Administered 2015-10-15: 1000 mg via ORAL

## 2015-10-15 MED ORDER — RITUXIMAB CHEMO INJECTION 500 MG/50ML
1000.0000 mg | Freq: Once | INTRAVENOUS | Status: AC
  Administered 2015-10-15: 1000 mg via INTRAVENOUS
  Filled 2015-10-15: qty 100

## 2015-10-15 MED ORDER — ACETAMINOPHEN 500 MG PO TABS
ORAL_TABLET | ORAL | Status: AC
  Filled 2015-10-15: qty 2

## 2015-10-15 MED ORDER — METHYLPREDNISOLONE SODIUM SUCC 125 MG IJ SOLR
100.0000 mg | Freq: Once | INTRAMUSCULAR | Status: AC
  Administered 2015-10-15: 100 mg via INTRAVENOUS

## 2015-10-15 MED ORDER — METHYLPREDNISOLONE SODIUM SUCC 125 MG IJ SOLR
INTRAMUSCULAR | Status: AC
  Filled 2015-10-15: qty 2

## 2015-10-27 DIAGNOSIS — I129 Hypertensive chronic kidney disease with stage 1 through stage 4 chronic kidney disease, or unspecified chronic kidney disease: Secondary | ICD-10-CM | POA: Diagnosis not present

## 2015-10-27 DIAGNOSIS — Z0000 Encounter for general adult medical examination without abnormal findings: Secondary | ICD-10-CM | POA: Diagnosis not present

## 2015-10-27 DIAGNOSIS — Z23 Encounter for immunization: Secondary | ICD-10-CM | POA: Diagnosis not present

## 2015-10-27 DIAGNOSIS — Z1389 Encounter for screening for other disorder: Secondary | ICD-10-CM | POA: Diagnosis not present

## 2015-10-27 DIAGNOSIS — E039 Hypothyroidism, unspecified: Secondary | ICD-10-CM | POA: Diagnosis not present

## 2015-10-27 DIAGNOSIS — M810 Age-related osteoporosis without current pathological fracture: Secondary | ICD-10-CM | POA: Diagnosis not present

## 2015-10-27 DIAGNOSIS — I10 Essential (primary) hypertension: Secondary | ICD-10-CM | POA: Diagnosis not present

## 2015-10-28 ENCOUNTER — Other Ambulatory Visit (HOSPITAL_COMMUNITY): Payer: Self-pay | Admitting: *Deleted

## 2015-10-29 ENCOUNTER — Encounter (HOSPITAL_COMMUNITY)
Admission: RE | Admit: 2015-10-29 | Discharge: 2015-10-29 | Disposition: A | Discharge status: Home / Self Care | Payer: Medicare Other | Source: Ambulatory Visit | Attending: Internal Medicine | Admitting: Internal Medicine

## 2015-10-29 DIAGNOSIS — M069 Rheumatoid arthritis, unspecified: Secondary | ICD-10-CM | POA: Diagnosis not present

## 2015-10-29 MED ORDER — SODIUM CHLORIDE 0.9 % IV SOLN
INTRAVENOUS | Status: DC
Start: 2015-10-29 — End: 2015-10-30
  Administered 2015-10-29: 10:00:00 via INTRAVENOUS

## 2015-10-29 MED ORDER — METHYLPREDNISOLONE SODIUM SUCC 125 MG IJ SOLR
INTRAMUSCULAR | Status: AC
  Filled 2015-10-29: qty 2

## 2015-10-29 MED ORDER — ACETAMINOPHEN 500 MG PO TABS
ORAL_TABLET | ORAL | Status: AC
  Administered 2015-10-29: 1000 mg
  Filled 2015-10-29: qty 2

## 2015-10-29 MED ORDER — SODIUM CHLORIDE 0.9 % IV SOLN
1000.0000 mg | Freq: Once | INTRAVENOUS | Status: AC
  Administered 2015-10-29: 1000 mg via INTRAVENOUS
  Filled 2015-10-29: qty 100

## 2015-10-29 MED ORDER — METHYLPREDNISOLONE SODIUM SUCC 125 MG IJ SOLR
100.0000 mg | Freq: Once | INTRAMUSCULAR | Status: AC
  Administered 2015-10-29: 100 mg via INTRAVENOUS

## 2015-10-29 MED ORDER — ACETAMINOPHEN 500 MG PO TABS: 1000.0000 mg | ORAL_TABLET | Freq: Once | ORAL | Status: DC

## 2015-11-04 DIAGNOSIS — I129 Hypertensive chronic kidney disease with stage 1 through stage 4 chronic kidney disease, or unspecified chronic kidney disease: Secondary | ICD-10-CM | POA: Diagnosis not present

## 2015-11-04 DIAGNOSIS — F17211 Nicotine dependence, cigarettes, in remission: Secondary | ICD-10-CM | POA: Diagnosis not present

## 2015-11-04 DIAGNOSIS — E785 Hyperlipidemia, unspecified: Secondary | ICD-10-CM | POA: Diagnosis not present

## 2015-11-04 DIAGNOSIS — E039 Hypothyroidism, unspecified: Secondary | ICD-10-CM | POA: Diagnosis not present

## 2015-11-30 ENCOUNTER — Encounter: Payer: Self-pay | Admitting: Adult Health

## 2015-11-30 ENCOUNTER — Ambulatory Visit (INDEPENDENT_AMBULATORY_CARE_PROVIDER_SITE_OTHER): Payer: Medicare Other | Admitting: Adult Health

## 2015-11-30 DIAGNOSIS — J8410 Pulmonary fibrosis, unspecified: Secondary | ICD-10-CM | POA: Diagnosis not present

## 2015-11-30 MED ORDER — HYDROCODONE-HOMATROPINE 5-1.5 MG/5ML PO SYRP: 5.0000 mL | ORAL_SOLUTION | Freq: Four times a day (QID) | ORAL | Status: DC | PRN

## 2015-11-30 NOTE — Progress Notes (Signed)
Subjective:    Patient ID: Veronica Stone, female    DOB: 17-Dec-1931, 82 y.o.   MRN: [id number]  HPI 84/F, Ex smoker , quit '90 with RA for FU of pulmonary fibrosis.  She has a strong family history - 07-Jul-2022 siblings died of 'fibrosis'  She reports dyspnea on exertion but is more limited by her arthritis. Occasional dry cough +  She smoked 30 Pyrs before quitting in '90. Worked at [employer], retired '88  She reports RA since 2008, tried humira, remicaid, now on rituxan q 48mnths, steroid dependent, on daily prednisone Veronica Stone)  Ct abd 8/10 sub pleural fibrosis  CXR 5/11bibasal scarring  PFTs 7/12 showed preserved lung volumes with FVC 77% & TLC 80%, DLCO reduced at 49%.  She desatn to 86% on second lap.  HRCT 6/12 showed diffuse chronic interstitial coarsening with scattered areas of bronchiectasis bilaterally & subpleural honeycombing consistent with UIP  ONO- 48 min satn < 88% >O2 at At bedtime (02/2011 )  - started O2 in oct'12  Trial of Lasix >> not much benefit.  Not interested in rehab  Wonders if her fibrosis is familial but not interested in research study , her sister passed away 09/22/2011    2011/10/23 FVC 82%, does not want to perform full PFTs - don't like that clip over my nose  FVC 84% -preserved -       11/30/2015 Follow up : Pulmonary Fibrosis  Pt returns for 6 month follow up .  Doing well on O2. 2l/m rest/bedtime  and 3l/m w/ act   Dry cough is worse at night . Hydromet helps with this.  No change in activity tolerance.   Followed by rheumatology for RA. On Rituximab. Every 93months.   No chest pain, orthopnea,increased edema or fever.   Prevnar 13 utd. She believes she had a recent Pneumovax.    Past Medical History  Diagnosis Date  . Colon, diverticulosis Nov 2007  . Hyperlipemia   . Hypertension   . Irregular heart rate   . Rheumatoid arthritis(714.0)   . IBS (irritable bowel syndrome)   . Personal history of colonic polyps 04/10/2006    hyperplastic  .  Pulmonary fibrosis ([place])    Current Outpatient Prescriptions on File Prior to Visit  Medication Sig Dispense Refill  . amLODipine (NORVASC) 5 MG tablet Take 2.5 mg by mouth daily.     Marland Kitchen DEXILANT 60 MG capsule Take 1 capsule by mouth as needed (FOR HEARTBURN).     Marland Kitchen diazepam (VALIUM) 5 MG tablet Take 5 mg by mouth at bedtime as needed. For anxiety/sleep    . estrogens, conjugated, (PREMARIN) 0.625 MG tablet Take 0.625 mg by mouth daily.      . folic acid (FOLVITE) 1 MG tablet Take 1 mg by mouth daily.      . furosemide (LASIX) 20 MG tablet Take 1 tablet by mouth Once daily as needed. For swelling    . HYDROcodone-acetaminophen (NORCO/VICODIN) 5-325 MG tablet Take 1 tablet by mouth every 6 (six) hours as needed.  0  . HYDROcodone-homatropine (HYCODAN) 5-1.5 MG/5ML syrup Take 5 mLs by mouth every 6 (six) hours as needed for cough. 120 mL 0  . irbesartan (AVAPRO) 300 MG tablet Take 1 tablet by mouth daily.    Marland Kitchen loratadine (CLARITIN) 10 MG tablet Take 10 mg by mouth daily.    Vladimir Faster Glycol-Propyl Glycol (SYSTANE) 0.4-0.3 % SOLN Apply 1 drop to eye daily. For dry eyes    . predniSONE (DELTASONE) 10  MG tablet Take 1 tablet by mouth daily.  5  . RiTUXimab (RITUXAN IV) Inject 2 application into the vein once. TAKES 2 DOSES IN ONE MONTH TIME, THEN DOESN'T TAKEN AGAIN FOR 6 MONTHS     No current facility-administered medications on file prior to visit.      Review of Systems neg for any significant sore throat, dysphagia, itching, sneezing, nasal congestion or excess/ purulent secretions, fever, chills, sweats, unintended wt loss, pleuritic or exertional cp, hempoptysis, orthopnea pnd or change in chronic leg swelling. Also denies presyncope, palpitations, heartburn, abdominal pain, nausea, vomiting, diarrhea or change in bowel or urinary habits, dysuria,hematuria, rash, arthralgias, visual complaints, headache, numbness weakness or ataxia.     Objective:   Physical Exam Filed Vitals:    11/30/15 1436  BP: 108/68  Pulse: 101  Temp: 97.3 F (36.3 C)  TempSrc: Oral  Height: 5\' 3"  (1.6 m)  Weight: 221 lb (100.245 kg)  SpO2: 95%   Body mass index is 39.16 kg/(m^2).   Gen. Pleasant, obese, in no distress ENT - no lesions, no post nasal drip Neck: No JVD, no thyromegaly, no carotid bruits Lungs: no use of accessory muscles, no dullness to percussion, decreased w/ bibasilar crackles  Cardiovascular: Rhythm regular, heart sounds  normal, no murmurs or gallops, tr-1+ peripheral edema Musculoskeletal: No deformities, no cyanosis or clubbing , no tremors  Veronica Vandall NP-C  [facility]  11/30/2015

## 2015-11-30 NOTE — Patient Instructions (Signed)
Continue on current regimen. Continue on Oxygen therapy .  Follow up Dr. Alva in 6 months and As needed   

## 2015-11-30 NOTE — Assessment & Plan Note (Signed)
Compensated without flare   Plan  Continue on current regimen. Continue on Oxygen therapy .  Follow up Dr. Elsworth Soho in 6 months and As needed

## 2015-11-30 NOTE — Assessment & Plan Note (Signed)
Compensated on O2  

## 2015-12-29 DIAGNOSIS — Z1231 Encounter for screening mammogram for malignant neoplasm of breast: Secondary | ICD-10-CM | POA: Diagnosis not present

## 2016-01-25 ENCOUNTER — Ambulatory Visit: Payer: BLUE CROSS/BLUE SHIELD | Admitting: Acute Care

## 2016-02-08 ENCOUNTER — Ambulatory Visit (INDEPENDENT_AMBULATORY_CARE_PROVIDER_SITE_OTHER): Payer: Medicare Other | Admitting: Acute Care

## 2016-02-08 ENCOUNTER — Encounter: Payer: Self-pay | Admitting: Acute Care

## 2016-02-08 DIAGNOSIS — Z23 Encounter for immunization: Secondary | ICD-10-CM | POA: Diagnosis not present

## 2016-02-08 DIAGNOSIS — J9611 Chronic respiratory failure with hypoxia: Secondary | ICD-10-CM

## 2016-02-08 DIAGNOSIS — J8410 Pulmonary fibrosis, unspecified: Secondary | ICD-10-CM | POA: Diagnosis not present

## 2016-02-08 MED ORDER — HYDROCODONE-HOMATROPINE 5-1.5 MG/5ML PO SYRP: 5.0000 mL | ORAL_SOLUTION | Freq: Four times a day (QID) | ORAL | 0 refills | Status: DC | PRN

## 2016-02-08 NOTE — Progress Notes (Addendum)
History of Present Illness Veronica Stone is a 82 y.o. female with RA and  pulmonary fibrosis, who requires oxygen 24/7. She is  followed by Dr. Elsworth Soho.  HPI 84/F, Ex smoker , quit '90 with RA for FU of pulmonary fibrosis.  She has a strong family history - 2022/07/04 siblings died of 'fibrosis'  She reports dyspnea on exertion but is more limited by her arthritis. Occasional dry cough +  She smoked 30 Pyrs before quitting in '90. Worked at [employer], retired '88  She reports RA since 2008, tried humira, remicaid, now on rituxan q 72mnths, steroid dependent, on daily prednisone [name])  Ct abd 8/10 sub pleural fibrosis  CXR 5/11bibasal scarring  PFTs 7/12 showed preserved lung volumes with FVC 77% & TLC 80%, DLCO reduced at 49%.  She desatn to 86% on second lap.  HRCT 6/12 showed diffuse chronic interstitial coarsening with scattered areas of bronchiectasis bilaterally & subpleural honeycombing consistent with UIP  ONO- 48 min satn < 88% >O2 at At bedtime (02/2011 )  - started O2 in oct'12   02/08/2016 OV for Oxygen re-certification:  Pt. Presents to the office for oxygen re-certification. She wears her oxygen 24/7 due to her pulmonary fibrosis. She is compliant with her use of oxygen. She wears 3L during the day with exertion , and 2L at night. She is dependent on her oxygen to maintain her oxygen saturations greater than 88% due to her pulmonary fibrosis.  She denies fever, chest pain, orthopnea or hemoptysis.She has cough that is worse in the mornings and evenings. She is compliant with her medication regimen. Last CXR 05/2015 did indicate worsening/ progressive  Pulmonary Fibrosis which correlates to her clinically worsening dyspnea.    Tests: Walk Test 02/08/2016: Patient Saturations on Room Air at Rest = 98% Patient Saturations on Room Air while Ambulating = 86% Patient Saturations on 3 Liters of oxygen while Ambulating = 94% Pt. Needs oxygen for chronic respiratory failure due to  Pulmonary Fibrosis.  06/02/2015: CXR Peripheral interstitial prominence throughout the lungs, worsening slightly since prior study compatible with worsening pulmonary fibrosis.   Significant tests/ events  Ct abd 12/2008 sub pleural fibrosis  HRCT 10/2010 showed diffuse chronic interstitial coarsening with scattered areas of bronchiectasis bilaterally & subpleural honeycombing consistent with UIP   PFTs 11/2010 showed preserved lung volumes with FVC 77% & TLC 80%, DLCO reduced at 49%.  She desatn to 86% on second lap.  02/2011 ONO- 48 min satn < 88% >O2 at At bedtime  - started O2   10/2011 FVC 82%, does not want to perform full PFTs - don't like that clip over my nose  FVC 84% -preserved -      Past medical hx Past Medical History:  Diagnosis Date  . Colon, diverticulosis Nov 2007  . Hyperlipemia   . Hypertension   . IBS (irritable bowel syndrome)   . Irregular heart rate   . Personal history of colonic polyps 04/10/2006   hyperplastic  . Pulmonary fibrosis ([place])   . Rheumatoid arthritis(714.0)      Past surgical hx, Family hx, Social hx all reviewed.  Current Outpatient Prescriptions on File Prior to Visit  Medication Sig  . amLODipine (NORVASC) 5 MG tablet Take 2.5 mg by mouth daily.   . cholecalciferol (VITAMIN D) 1000 units tablet Take 1,000 Units by mouth daily.  Marland Kitchen DEXILANT 60 MG capsule Take 1 capsule by mouth as needed (FOR HEARTBURN).   Marland Kitchen diazepam (VALIUM) 5 MG tablet Take 5 mg  by mouth at bedtime as needed. For anxiety/sleep  . estrogens, conjugated, (PREMARIN) 0.625 MG tablet Take 0.625 mg by mouth daily.    . folic acid (FOLVITE) 1 MG tablet Take 1 mg by mouth daily.    . furosemide (LASIX) 20 MG tablet Take 1 tablet by mouth Once daily as needed. For swelling  . HYDROcodone-acetaminophen (NORCO/VICODIN) 5-325 MG tablet Take 1 tablet by mouth every 6 (six) hours as needed.  . irbesartan (AVAPRO) 300 MG tablet Take 1 tablet by mouth daily.  Marland Kitchen loratadine  (CLARITIN) 10 MG tablet Take 10 mg by mouth daily.  Vladimir Faster Glycol-Propyl Glycol (SYSTANE) 0.4-0.3 % SOLN Apply 1 drop to eye daily. For dry eyes  . predniSONE (DELTASONE) 10 MG tablet Take 1 tablet by mouth daily.  . RiTUXimab (RITUXAN IV) Inject 2 application into the vein once. TAKES 2 DOSES IN ONE MONTH TIME, THEN DOESN'T TAKEN AGAIN FOR 6 MONTHS   No current facility-administered medications on file prior to visit.      Allergies  Allergen Reactions  . Dilaudid [Hydromorphone Hcl] Nausea And Vomiting  . Penicillins Itching and Rash  . Statins Itching    Review Of Systems:  Constitutional:   No  weight loss, night sweats,  Fevers, chills, fatigue, or  lassitude.  HEENT:   No headaches,  Difficulty swallowing,  Tooth/dental problems, or  Sore throat,                No sneezing, itching, ear ache, nasal congestion, post nasal drip,   CV:  No chest pain,  Orthopnea, PND, + swelling in lower extremities, anasarca, dizziness, palpitations, syncope.   GI  No heartburn, indigestion, abdominal pain, nausea, vomiting, diarrhea, change in bowel habits, loss of appetite, bloody stools.   Resp: + shortness of breath with exertion less at rest.  No  excess mucus, no productive cough,  No non-productive cough,  No coughing up of blood.  No change in color of mucus.  No wheezing.  No chest wall deformity  Skin: no rash or lesions.  GU: no dysuria, change in color of urine, no urgency or frequency.  No flank pain, no hematuria   MS:  No joint pain or swelling.  No decreased range of motion.  No back pain.  Psych:  No change in mood or affect. No depression or anxiety.  No memory loss.   Vital Signs BP 130/86 (BP Location: Left Arm, Cuff Size: Normal)   Pulse 91   Temp 97.7 F (36.5 C) (Oral)   Ht 5\' 3"  (1.6 m)   Wt 221 lb (100.2 kg)   SpO2 97%   BMI 39.15 kg/m    Physical Exam:  General- No distress,  A&Ox3, pleasant, obese female ENT: No sinus tenderness, TM clear, pale  nasal mucosa, no oral exudate,no post nasal drip, no LAN Cardiac: S1, S2, regular rate and rhythm, no murmur Chest: No wheeze/ rales/ dullness; no accessory muscle use, no nasal flaring, no sternal retractions Abd.: Soft Non-tender Ext: No clubbing cyanosis, 1+ bilateral lower extremity edema Neuro:  normal strength Skin: No rashes, warm and dry Psych: normal mood and behavior   Assessment/Plan  Pulmonary fibrosis Oxygen recertification for Insurance Plan: We will walk you in the office to re-certify your oxygen. Follow up with Dr. Elsworth Soho or NP in January 2018. Flu shot today in the office. Continue current medication regimen Continue oxygen therapy. Please contact office for sooner follow up if symptoms do not improve or worsen or seek  emergency care    Chronic respiratory failure ([place]) Compensated on oxygen 3L during the day and 2L at night.    Magdalen Spatz, NP 02/08/2016  12:47 PM

## 2016-02-08 NOTE — Patient Instructions (Addendum)
It is nice to meet you today. We will write a prescription for Hycodan today. We will walk you in the office to re-certify your oxygen. Follow up with Dr. Elsworth Soho or NP in January 2018. Flu shot today in the office. Please contact office for sooner follow up if symptoms do not improve or worsen or seek emergency care

## 2016-02-08 NOTE — Assessment & Plan Note (Addendum)
Oxygen recertification for Insurance Plan: We will walk you in the office to re-certify your oxygen. Follow up with Dr. Elsworth Soho or NP in January 2018. Flu shot today in the office. Continue current medication regimen Continue oxygen therapy. Please contact office for sooner follow up if symptoms do not improve or worsen or seek emergency care

## 2016-02-08 NOTE — Assessment & Plan Note (Signed)
Compensated on oxygen 3L during the day and 2L at night.

## 2016-02-16 NOTE — Progress Notes (Signed)
Reviewed & agree with plan  

## 2016-02-29 DIAGNOSIS — J8410 Pulmonary fibrosis, unspecified: Secondary | ICD-10-CM | POA: Diagnosis not present

## 2016-02-29 DIAGNOSIS — B353 Tinea pedis: Secondary | ICD-10-CM | POA: Diagnosis not present

## 2016-02-29 DIAGNOSIS — M150 Primary generalized (osteo)arthritis: Secondary | ICD-10-CM | POA: Diagnosis not present

## 2016-02-29 DIAGNOSIS — M0579 Rheumatoid arthritis with rheumatoid factor of multiple sites without organ or systems involvement: Secondary | ICD-10-CM | POA: Diagnosis not present

## 2016-02-29 DIAGNOSIS — M810 Age-related osteoporosis without current pathological fracture: Secondary | ICD-10-CM | POA: Diagnosis not present

## 2016-04-27 ENCOUNTER — Telehealth: Payer: Self-pay | Admitting: Pulmonary Disease

## 2016-04-27 DIAGNOSIS — M810 Age-related osteoporosis without current pathological fracture: Secondary | ICD-10-CM | POA: Diagnosis not present

## 2016-04-27 DIAGNOSIS — E039 Hypothyroidism, unspecified: Secondary | ICD-10-CM | POA: Diagnosis not present

## 2016-04-27 DIAGNOSIS — E785 Hyperlipidemia, unspecified: Secondary | ICD-10-CM | POA: Diagnosis not present

## 2016-04-27 DIAGNOSIS — I129 Hypertensive chronic kidney disease with stage 1 through stage 4 chronic kidney disease, or unspecified chronic kidney disease: Secondary | ICD-10-CM | POA: Diagnosis not present

## 2016-04-27 DIAGNOSIS — I10 Essential (primary) hypertension: Secondary | ICD-10-CM | POA: Diagnosis not present

## 2016-04-27 DIAGNOSIS — E559 Vitamin D deficiency, unspecified: Secondary | ICD-10-CM | POA: Diagnosis not present

## 2016-04-27 NOTE — Telephone Encounter (Signed)
Spoke with patient, requesting an Rx for cough medication to [facility].  Pt is wanting a refill of her Hycodan, pt states that she has been mailed this in the past. Aware that Hycodan contains a controlled substance which by law we are not supposed to mail, this is supposed to be picked, pt states that she she has been mailed this the past few times it was written. Pt states that if is is an issue and cannot be mailed she will try and have someone come by to pick it up. Aware that we will ask one of our physicians in the morning if they are okay with prescribing this in Dr Bari Mantis absence.  Will hold in triage to be sent to DOD in the AM

## 2016-04-28 ENCOUNTER — Telehealth: Payer: Self-pay | Admitting: Pulmonary Disease

## 2016-04-28 MED ORDER — HYDROCODONE-HOMATROPINE 5-1.5 MG/5ML PO SYRP: 5.0000 mL | ORAL_SOLUTION | Freq: Four times a day (QID) | ORAL | 0 refills | Status: DC | PRN

## 2016-04-28 NOTE — Telephone Encounter (Signed)
Ok to refill 

## 2016-04-28 NOTE — Telephone Encounter (Signed)
Hycodan refilled, placed on CY cart to sign. This will need to be picked up.  Please contact patient once signed to have her come pick this up or send family member. Thanks.

## 2016-04-28 NOTE — Telephone Encounter (Signed)
Pt daughter came by and picked up Hycodan Rx.  Nothing further needed.

## 2016-04-28 NOTE — Telephone Encounter (Signed)
rx has been signed by CY---called and spoke with pt and she stated that her daughter will come by and pick this up tomorrow.  Nothing further is needed.

## 2016-05-04 DIAGNOSIS — E785 Hyperlipidemia, unspecified: Secondary | ICD-10-CM | POA: Diagnosis not present

## 2016-05-04 DIAGNOSIS — E039 Hypothyroidism, unspecified: Secondary | ICD-10-CM | POA: Diagnosis not present

## 2016-05-04 DIAGNOSIS — N183 Chronic kidney disease, stage 3 (moderate): Secondary | ICD-10-CM | POA: Diagnosis not present

## 2016-05-04 DIAGNOSIS — I129 Hypertensive chronic kidney disease with stage 1 through stage 4 chronic kidney disease, or unspecified chronic kidney disease: Secondary | ICD-10-CM | POA: Diagnosis not present

## 2016-05-24 ENCOUNTER — Ambulatory Visit (INDEPENDENT_AMBULATORY_CARE_PROVIDER_SITE_OTHER): Payer: Medicare Other | Admitting: Pulmonary Disease

## 2016-05-24 ENCOUNTER — Encounter: Payer: Self-pay | Admitting: Pulmonary Disease

## 2016-05-24 VITALS — BP 124/78 | HR 112 | Ht 63.0 in | Wt 204.0 lb

## 2016-05-24 DIAGNOSIS — J9611 Chronic respiratory failure with hypoxia: Secondary | ICD-10-CM | POA: Diagnosis not present

## 2016-05-24 DIAGNOSIS — J8410 Pulmonary fibrosis, unspecified: Secondary | ICD-10-CM

## 2016-05-24 NOTE — Patient Instructions (Signed)
Schedule high-resolution CT scan of the lungs to evaluate scarring in the lungs  Call if breathing worse

## 2016-05-24 NOTE — Assessment & Plan Note (Signed)
Appears to be stable by symptoms, she prefers not to have PFTs done We'll obtain high-resolution CT chest to get a better idea whether fibrosis is worsening or not  Differential diagnosis includes rheumatoid arthritis related versus IPF

## 2016-05-24 NOTE — Progress Notes (Signed)
   Subjective:    Patient ID: Veronica Stone, female    DOB: 02-02-1932, 83 y.o.   MRN: [id number]  HPI  84/F, Ex smoker , quit '90 with RA for FU of pulmonary fibrosis.  She has a strong family history - 07/02/22 siblings died of 'fibrosis'  She reports dyspnea on exertion but is more limited by her arthritis. Occasional dry cough    She smoked 30 Pyrs before quitting in '90. Worked at [employer], retired '88  She reports RA since 2008, tried humira, remicaid, now on rituxan q 6mnths, steroid dependent, on daily prednisone Veronica Stone)      05/24/2016  Chief Complaint  Patient presents with  . Follow-up    6 month follow up. Breathing has not changed since last visit. Small episodes of SOB.    Three-month follow-up She underwent oxygen recertification with a visit with my nurse practitioner in 01/2015 Her dyspnea is at baseline, no change in cough  Takes lasix on occ for pedal edema Followed by rheumatology for RA - [place], on rituxan q 66mnths, she does not want to get shots in his office and prefers to get it at the hospital hence is delaying her current dosing   No chest pain, orthopnea, edema or fever.   Significant tests/ events  Ct abd 12/2008 sub pleural fibrosis  HRCT 10/2010 showed diffuse chronic interstitial coarsening with scattered areas of bronchiectasis bilaterally & subpleural honeycombing consistent with UIP   PFTs 11/2010 showed preserved lung volumes with FVC 77% & TLC 80%, DLCO reduced at 49%.  She desatn to 86% on second lap.  02/2011 ONO- 48 min satn < 88% >O2 at At bedtime  - started O2   10/2011 FVC 82%, does not want to perform full PFTs - don't like that clip over my nose  FVC 84% -preserved -  Review of Systems Patient denies significant dyspnea,cough, hemoptysis,  chest pain, palpitations, pedal edema, orthopnea, paroxysmal nocturnal dyspnea, lightheadedness, nausea, vomiting, abdominal or  leg pains      Objective:   Physical Exam  Gen.  Pleasant, obese, in no distress ENT - no lesions, no post nasal drip Neck: No JVD, no thyromegaly, no carotid bruits Lungs: no use of accessory muscles, no dullness to percussion, bibasal rales, no rhonchi  Cardiovascular: Rhythm regular, heart sounds  normal, no murmurs or gallops, no peripheral edema Musculoskeletal: No deformities, no cyanosis or clubbing , no tremors       Assessment & Plan:

## 2016-05-24 NOTE — Assessment & Plan Note (Signed)
Continue 2-3 L of oxygen

## 2016-06-01 ENCOUNTER — Encounter (HOSPITAL_COMMUNITY): Payer: BLUE CROSS/BLUE SHIELD

## 2016-06-03 ENCOUNTER — Inpatient Hospital Stay: Admission: RE | Admit: 2016-06-03 | Payer: Medicare Other | Source: Ambulatory Visit

## 2016-06-06 ENCOUNTER — Ambulatory Visit (INDEPENDENT_AMBULATORY_CARE_PROVIDER_SITE_OTHER)
Admission: RE | Admit: 2016-06-06 | Discharge: 2016-06-06 | Disposition: A | Discharge status: Home / Self Care | Payer: Medicare Other | Source: Ambulatory Visit | Attending: Pulmonary Disease | Admitting: Pulmonary Disease

## 2016-06-06 DIAGNOSIS — R0602 Shortness of breath: Secondary | ICD-10-CM | POA: Diagnosis not present

## 2016-06-06 DIAGNOSIS — J8410 Pulmonary fibrosis, unspecified: Secondary | ICD-10-CM

## 2016-06-07 ENCOUNTER — Other Ambulatory Visit (HOSPITAL_COMMUNITY): Payer: Self-pay | Admitting: *Deleted

## 2016-06-08 ENCOUNTER — Ambulatory Visit (HOSPITAL_COMMUNITY)
Admission: RE | Admit: 2016-06-08 | Discharge: 2016-06-08 | Disposition: A | Discharge status: Home / Self Care | Payer: Medicare Other | Source: Ambulatory Visit | Attending: Internal Medicine | Admitting: Internal Medicine

## 2016-06-08 ENCOUNTER — Encounter (HOSPITAL_COMMUNITY): Payer: Self-pay

## 2016-06-08 DIAGNOSIS — M0579 Rheumatoid arthritis with rheumatoid factor of multiple sites without organ or systems involvement: Secondary | ICD-10-CM | POA: Insufficient documentation

## 2016-06-08 MED ORDER — METHYLPREDNISOLONE SODIUM SUCC 125 MG IJ SOLR
100.0000 mg | INTRAMUSCULAR | Status: DC
  Administered 2016-06-08: 100 mg via INTRAVENOUS

## 2016-06-08 MED ORDER — SODIUM CHLORIDE 0.9 % IV SOLN
1000.0000 mg | INTRAVENOUS | Status: DC
  Administered 2016-06-08: 1000 mg via INTRAVENOUS
  Filled 2016-06-08: qty 100

## 2016-06-08 MED ORDER — SODIUM CHLORIDE 0.9 % IV SOLN
INTRAVENOUS | Status: DC
  Administered 2016-06-08: 10:00:00 via INTRAVENOUS

## 2016-06-08 MED ORDER — ACETAMINOPHEN 325 MG PO TABS: 650.0000 mg | ORAL_TABLET | ORAL | Status: DC

## 2016-06-08 MED ORDER — METHYLPREDNISOLONE SODIUM SUCC 125 MG IJ SOLR
INTRAMUSCULAR | Status: AC
  Administered 2016-06-08: 100 mg via INTRAVENOUS
  Filled 2016-06-08: qty 2

## 2016-06-08 NOTE — Final Progress Note (Signed)
Unable to locate TB test, called office, spoke with Wallis Bamberg, RN. Jeani Hawking reported no TB results that she could find in the last year. Per Dr. Amil Amen, draw Valma Cava today and okay to proceed with Rituxan infusion today.

## 2016-06-12 LAB — QUANTIFERON IN TUBE
QFT TB AG MINUS NIL VALUE: 0 IU/mL
QUANTIFERON MITOGEN VALUE: 7.12 [IU]/mL
QUANTIFERON NIL VALUE: 0.02 [IU]/mL
QUANTIFERON TB AG VALUE: 0.02 IU/mL
QUANTIFERON TB GOLD: NEGATIVE

## 2016-06-12 LAB — QUANTIFERON TB GOLD ASSAY (BLOOD)

## 2016-06-22 ENCOUNTER — Encounter (HOSPITAL_COMMUNITY)
Admission: RE | Admit: 2016-06-22 | Discharge: 2016-06-22 | Disposition: A | Discharge status: Home / Self Care | Payer: Medicare Other | Source: Ambulatory Visit | Attending: Internal Medicine | Admitting: Internal Medicine

## 2016-06-22 DIAGNOSIS — M0579 Rheumatoid arthritis with rheumatoid factor of multiple sites without organ or systems involvement: Secondary | ICD-10-CM | POA: Diagnosis not present

## 2016-06-22 MED ORDER — ACETAMINOPHEN 325 MG PO TABS: 650.0000 mg | ORAL_TABLET | ORAL | Status: DC

## 2016-06-22 MED ORDER — METHYLPREDNISOLONE SODIUM SUCC 125 MG IJ SOLR
100.0000 mg | INTRAMUSCULAR | Status: AC
  Administered 2016-06-22: 100 mg via INTRAVENOUS

## 2016-06-22 MED ORDER — METHYLPREDNISOLONE SODIUM SUCC 125 MG IJ SOLR
INTRAMUSCULAR | Status: AC
  Administered 2016-06-22: 100 mg via INTRAVENOUS
  Filled 2016-06-22: qty 2

## 2016-06-22 MED ORDER — SODIUM CHLORIDE 0.9 % IV SOLN
INTRAVENOUS | Status: DC
Start: 2016-06-22 — End: 2016-06-23
  Administered 2016-06-22: 10:00:00 via INTRAVENOUS

## 2016-06-22 MED ORDER — SODIUM CHLORIDE 0.9 % IV SOLN
1000.0000 mg | INTRAVENOUS | Status: AC
  Administered 2016-06-22: 1000 mg via INTRAVENOUS
  Filled 2016-06-22: qty 100

## 2016-06-27 ENCOUNTER — Encounter (INDEPENDENT_AMBULATORY_CARE_PROVIDER_SITE_OTHER): Payer: Self-pay

## 2016-06-27 ENCOUNTER — Ambulatory Visit (INDEPENDENT_AMBULATORY_CARE_PROVIDER_SITE_OTHER): Payer: Medicare Other | Admitting: Internal Medicine

## 2016-06-27 ENCOUNTER — Encounter: Payer: Self-pay | Admitting: Internal Medicine

## 2016-06-27 VITALS — BP 128/82 | HR 102 | Ht 63.0 in | Wt 222.2 lb

## 2016-06-27 DIAGNOSIS — J8410 Pulmonary fibrosis, unspecified: Secondary | ICD-10-CM

## 2016-06-27 DIAGNOSIS — J9611 Chronic respiratory failure with hypoxia: Secondary | ICD-10-CM | POA: Diagnosis not present

## 2016-06-27 DIAGNOSIS — J439 Emphysema, unspecified: Secondary | ICD-10-CM

## 2016-06-27 MED ORDER — HYDROCODONE-HOMATROPINE 5-1.5 MG/5ML PO SYRP: 5.0000 mL | ORAL_SOLUTION | Freq: Four times a day (QID) | ORAL | 0 refills | Status: DC | PRN

## 2016-06-27 NOTE — Progress Notes (Signed)
Subjective:     Patient ID: Veronica Stone, female   DOB: 11-01-1931, 83 y.o.   MRN: [id number]  HPI    OV 06/27/2016  Chief Complaint  Patient presents with  . Acute Visit    This a RA pt., Pt. is here to discuss the CT scan results, Pt. has been coughing alot more at night, Pt. feels like her breathing has remained unchange   Patient of Dr. Elsworth Soho with interstitial lung disease for many fibrosis secondary to rheumatoid arthritis   Last seen by Dr. Elsworth Soho 06/06/2016. Due to concern for progression and her inability to do pulmonary function test he ordered a high-resolution CT chest which is reviewed below which is progressive interstitial lung disease of UIP pattern. In addition she has some emphysema. She does tell me that since her diagnosis of interstitial lung disease in 2012 she gradually worsened although no significant worsening in the last year or so. She is not surprised by the findings on the CT scan. She's not interested in any medication therapy. She does want supportive care with oxygen and cough treatment.  Of note there was a scheduling error and patient got accidentally booked with me; she is okay seeing me today.   Forced vital capacity February 2015 - was 79%.  She had high resolution CT chest January 2018: This is personally visualized. It has UIP pattern according to the thoracic radiologist and I agree with that finding. There is progression compared to 2012 . She does have some associated emphysema and coronary artery calcification but she did have a normal nuclear medicine cardiac stress test in 2015 according to chart review  Blood work 06/08/2016 2 weeks ago shows - negative Quantiferon GOld   has a past medical history of Colon, diverticulosis (Nov 2007); Hyperlipemia; Hypertension; IBS (irritable bowel syndrome); Irregular heart rate; Personal history of colonic polyps (04/10/2006); Pulmonary fibrosis ([place]); and Rheumatoid arthritis(714.0).   reports that she quit  smoking about 28 years ago. Her smoking use included Cigarettes. She has a 30.00 pack-year smoking history. She has never used smokeless tobacco.  Past Surgical History:  Procedure Laterality Date  . ABDOMINAL HYSTERECTOMY    . APPENDECTOMY    . CHOLECYSTECTOMY    . CYSTECTOMY     left breast    Allergies  Allergen Reactions  . Dilaudid [Hydromorphone Hcl] Nausea And Vomiting  . Penicillins Itching and Rash  . Statins Itching    Immunization History  Administered Date(s) Administered  . Influenza Split 02/14/2011, 02/20/2012, 02/13/2013  . Influenza, High Dose Seasonal PF 02/08/2016  . Influenza-Unspecified 02/13/2014, 03/08/2015  . Pneumococcal Conjugate-13 03/25/2014    Family History  Problem Relation Age of Onset  . Heart disease Brother   . Heart disease Sister   . Liver disease Daughter   . Diabetes Brother   . Rheum arthritis Mother      Current Outpatient Prescriptions:  .  amLODipine (NORVASC) 5 MG tablet, Take 2.5 mg by mouth daily. , Disp: , Rfl:  .  cholecalciferol (VITAMIN D) 1000 units tablet, Take 1,000 Units by mouth daily., Disp: , Rfl:  .  DEXILANT 60 MG capsule, Take 1 capsule by mouth as needed (FOR HEARTBURN). , Disp: , Rfl:  .  diazepam (VALIUM) 5 MG tablet, Take 5 mg by mouth at bedtime as needed. For anxiety/sleep, Disp: , Rfl:  .  estrogens, conjugated, (PREMARIN) 0.625 MG tablet, Take 0.625 mg by mouth daily.  , Disp: , Rfl:  .  folic acid (FOLVITE) 1  MG tablet, Take 1 mg by mouth daily.  , Disp: , Rfl:  .  furosemide (LASIX) 20 MG tablet, Take 1 tablet by mouth Once daily as needed. For swelling, Disp: , Rfl:  .  HYDROcodone-acetaminophen (NORCO/VICODIN) 5-325 MG tablet, Take 1 tablet by mouth every 6 (six) hours as needed., Disp: , Rfl: 0 .  HYDROcodone-homatropine (HYCODAN) 5-1.5 MG/5ML syrup, Take 5 mLs by mouth every 6 (six) hours as needed for cough., Disp: 120 mL, Rfl: 0 .  irbesartan (AVAPRO) 300 MG tablet, Take 1 tablet by mouth daily.,  Disp: , Rfl:  .  loratadine (CLARITIN) 10 MG tablet, Take 10 mg by mouth daily., Disp: , Rfl:  .  Polyethyl Glycol-Propyl Glycol (SYSTANE) 0.4-0.3 % SOLN, Apply 1 drop to eye daily. For dry eyes, Disp: , Rfl:  .  predniSONE (DELTASONE) 10 MG tablet, Take 1 tablet by mouth daily., Disp: , Rfl: 5 .  RiTUXimab (RITUXAN IV), Inject 2 application into the vein once. TAKES 2 DOSES IN ONE MONTH TIME, THEN DOESN'T TAKEN AGAIN FOR 6 MONTHS, Disp: , Rfl:     Review of Systems     Objective:   Physical Exam Vitals:   06/27/16 1009  BP: 128/82  Pulse: (!) 102  SpO2: 96%  Weight: 222 lb 3.2 oz (100.8 kg)  Height: 5\' 3"  (1.6 m)    Estimated body mass index is 39.36 kg/m as calculated from the following:   Height as of this encounter: 5\' 3"  (1.6 m).   Weight as of this encounter: 222 lb 3.2 oz (100.8 kg). Obese elderly woman Wearing sunglasses Sitting with a cane Bilateral chronic edema Bilateral bibasal crackles Normal heart sounds Alert and oriented 3 Antalgic gait with deformities of rheumatoid arthritis present in the hands Skin is intact in the exposed areas     Assessment:       ICD-9-CM ICD-10-CM   1. Pulmonary fibrosis ([place]) 515 J84.10   2. Chronic respiratory failure with hypoxia ([facility]) 518.83 J96.11    799.02    3. Pulmonary emphysema with fibrosis of lung ([place]) 492.8 J43.9    515 J84.10        Plan:     Progressive disease since 2012 on CT chest in Jan 2018  Plan  -=agree with o2 support  - respect decision to refuse rehab, patient support group, inhalers, anti-fibrotic therapy, and immune modulator treatment  (we discussed all this in detail and she does not want any of this)  Followup  - change July 2018 appt from [facility] to Dr Elsworth Soho   > 50% of this > 25 min visit spent in face to face counseling or coordination of care    Dr. Brand Males, M.D., [facility].C.P Pulmonary and Critical Care Medicine Staff Physician [facility] Pager: [phone], If no answer or between  15:00h - 7:00h: call [phone]  06/27/2016 10:28 AM

## 2016-06-27 NOTE — Patient Instructions (Addendum)
ICD-9-CM ICD-10-CM   1. Pulmonary fibrosis ([place]) 515 J84.10   2. Chronic respiratory failure with hypoxia ([facility]) 518.83 J96.11    799.02    3. Pulmonary emphysema with fibrosis of lung ([place]) 492.8 J43.9    515 J84.10    Progressive disease since 2012 on CT chest in Jan 2018  Plan  -=agree with o2 support  - respect decision to refuse rehab, patient support group, inhalers, anti-fibrotic therapy, and immune modulator treatment   Followup  - change July 2018 appt from [facility] to Dr Elsworth Soho

## 2016-07-11 DIAGNOSIS — H353131 Nonexudative age-related macular degeneration, bilateral, early dry stage: Secondary | ICD-10-CM | POA: Diagnosis not present

## 2016-07-11 DIAGNOSIS — H02831 Dermatochalasis of right upper eyelid: Secondary | ICD-10-CM | POA: Diagnosis not present

## 2016-07-11 DIAGNOSIS — H35372 Puckering of macula, left eye: Secondary | ICD-10-CM | POA: Diagnosis not present

## 2016-07-11 DIAGNOSIS — H31002 Unspecified chorioretinal scars, left eye: Secondary | ICD-10-CM | POA: Diagnosis not present

## 2016-09-14 ENCOUNTER — Telehealth: Payer: Self-pay | Admitting: Pulmonary Disease

## 2016-09-14 DIAGNOSIS — J439 Emphysema, unspecified: Secondary | ICD-10-CM

## 2016-09-14 DIAGNOSIS — J9611 Chronic respiratory failure with hypoxia: Secondary | ICD-10-CM

## 2016-09-14 DIAGNOSIS — J8410 Pulmonary fibrosis, unspecified: Principal | ICD-10-CM

## 2016-09-14 NOTE — Telephone Encounter (Signed)
lmtcb X1 for pt  

## 2016-09-15 NOTE — Telephone Encounter (Signed)
ok 

## 2016-09-15 NOTE — Telephone Encounter (Signed)
Order placed, pt aware.  Nothing further needed.  

## 2016-09-15 NOTE — Telephone Encounter (Signed)
Called and spoke with pt and she stated that her portable oxygen runs out before she can get anything done.  She stated that she would rather go back to doing what she has been doing.  RA please advise if you are ok with this.  thanks

## 2016-09-20 ENCOUNTER — Telehealth: Payer: Self-pay

## 2016-09-20 NOTE — Telephone Encounter (Signed)
I reviewed forms and it appears on the second sheet pt did not sign the [employer] customer portion. I have spoken with pt and made her aware of this. Pt request that we mail forms back to her mailing address, incase [employer] does not accept forms that were faxed without missing signature.  Forms have been placed in outgoing mail.  Nothing further needed.

## 2016-09-20 NOTE — Telephone Encounter (Signed)
physician's verification has been completed by RA, and faxed to provided number by [employer]. Pt is aware and voiced her understanding. Received fax confirmation. Nothing further needed.

## 2016-09-26 ENCOUNTER — Telehealth: Payer: Self-pay | Admitting: Pulmonary Disease

## 2016-09-26 MED ORDER — HYDROCODONE-HOMATROPINE 5-1.5 MG/5ML PO SYRP: 5.0000 mL | ORAL_SOLUTION | Freq: Four times a day (QID) | ORAL | 0 refills | Status: DC | PRN

## 2016-09-26 NOTE — Telephone Encounter (Signed)
Okay to refill 1 

## 2016-09-26 NOTE — Telephone Encounter (Signed)
Spoke with the pt  She is requesting refill on Hycodan  She states her cough is at baseline  She states "I just need that cough syrup It's the only thing that ever helps"  She wants to pick up Rx tomorrow  Last given 06/27/16 by MR- Hycodan #120 ml  Please advise, thanks!

## 2016-09-26 NOTE — Telephone Encounter (Signed)
Rx has been placed in brown folder up front for pick up. Pt is aware and voiced her understanding. Pt states her daughter will pick Rx up tomorrow. Nothing further needed.

## 2016-10-06 DIAGNOSIS — M150 Primary generalized (osteo)arthritis: Secondary | ICD-10-CM | POA: Diagnosis not present

## 2016-10-06 DIAGNOSIS — M0579 Rheumatoid arthritis with rheumatoid factor of multiple sites without organ or systems involvement: Secondary | ICD-10-CM | POA: Diagnosis not present

## 2016-10-06 DIAGNOSIS — J8410 Pulmonary fibrosis, unspecified: Secondary | ICD-10-CM | POA: Diagnosis not present

## 2016-10-06 DIAGNOSIS — Z6841 Body Mass Index (BMI) 40.0 and over, adult: Secondary | ICD-10-CM | POA: Diagnosis not present

## 2016-10-27 DIAGNOSIS — I10 Essential (primary) hypertension: Secondary | ICD-10-CM | POA: Diagnosis not present

## 2016-10-27 DIAGNOSIS — E039 Hypothyroidism, unspecified: Secondary | ICD-10-CM | POA: Diagnosis not present

## 2016-10-27 DIAGNOSIS — E559 Vitamin D deficiency, unspecified: Secondary | ICD-10-CM | POA: Diagnosis not present

## 2016-10-27 DIAGNOSIS — E785 Hyperlipidemia, unspecified: Secondary | ICD-10-CM | POA: Diagnosis not present

## 2016-10-27 DIAGNOSIS — Z0000 Encounter for general adult medical examination without abnormal findings: Secondary | ICD-10-CM | POA: Diagnosis not present

## 2016-11-02 DIAGNOSIS — E559 Vitamin D deficiency, unspecified: Secondary | ICD-10-CM | POA: Diagnosis not present

## 2016-11-02 DIAGNOSIS — I10 Essential (primary) hypertension: Secondary | ICD-10-CM | POA: Diagnosis not present

## 2016-11-02 DIAGNOSIS — F419 Anxiety disorder, unspecified: Secondary | ICD-10-CM | POA: Diagnosis not present

## 2016-11-02 DIAGNOSIS — K219 Gastro-esophageal reflux disease without esophagitis: Secondary | ICD-10-CM | POA: Diagnosis not present

## 2016-11-21 ENCOUNTER — Ambulatory Visit: Payer: BLUE CROSS/BLUE SHIELD | Admitting: Adult Health

## 2016-12-05 ENCOUNTER — Other Ambulatory Visit (HOSPITAL_COMMUNITY): Payer: Self-pay | Admitting: *Deleted

## 2016-12-06 ENCOUNTER — Inpatient Hospital Stay (HOSPITAL_COMMUNITY): Admission: RE | Admit: 2016-12-06 | Payer: BLUE CROSS/BLUE SHIELD | Source: Ambulatory Visit

## 2016-12-12 ENCOUNTER — Ambulatory Visit (INDEPENDENT_AMBULATORY_CARE_PROVIDER_SITE_OTHER): Payer: Medicare Other | Admitting: Pulmonary Disease

## 2016-12-12 ENCOUNTER — Encounter: Payer: Self-pay | Admitting: Pulmonary Disease

## 2016-12-12 DIAGNOSIS — J9611 Chronic respiratory failure with hypoxia: Secondary | ICD-10-CM

## 2016-12-12 DIAGNOSIS — J8410 Pulmonary fibrosis, unspecified: Secondary | ICD-10-CM

## 2016-12-12 DIAGNOSIS — J439 Emphysema, unspecified: Secondary | ICD-10-CM | POA: Diagnosis not present

## 2016-12-12 MED ORDER — HYDROCODONE-HOMATROPINE 5-1.5 MG/5ML PO SYRP: 5.0000 mL | ORAL_SOLUTION | Freq: Four times a day (QID) | ORAL | 0 refills | Status: DC | PRN

## 2016-12-12 NOTE — Assessment & Plan Note (Signed)
Continue on 3 L oxygen.

## 2016-12-12 NOTE — Progress Notes (Signed)
   Subjective:    Patient ID: Veronica Stone, female    DOB: 08/05/1931, 83 y.o.   MRN: [id number]  HPI  83/F, Ex smoker , quit '90 with RA for FU of pulmonary fibrosis.  She has a strong family history - 07/26/2022 siblings died of 'fibrosis'   She smoked 30 Pyrs before quitting in '90. Worked at [employer], retired '88  She reports RA since 2008, tried humira, remicaid, now on rituxan q 81mnths, steroid dependent, on daily prednisone    Chief Complaint  Patient presents with  . Follow-up    6 month follow up. States her breathing has been the same since the last visit. Unable to walk long distances without getting out of breath.    Six-month follow-up. Breathing continues to do worse, she stays on 3 L of oxygen now She is maintained on 10 mg of prednisone daily for many years and also takes Rituxan every 6 months, she is concerned due to her recent fall and skin breakdown with bruising, she may not get her next dose of Rituxan  Arthritis seems to be under control with some deformities in her hands She has a chronic cough and uses Hycodan- she states that she does not like pain pills and uses less than what she is prescribed Accompanied by her daughter today  We reviewed her CT done earlier this year  Significant tests/ events  Ct abd 12/2008 sub pleural fibrosis  HRCT 10/2010 showed diffuse chronic interstitial coarsening with scattered areas of bronchiectasis bilaterally &subpleural honeycombing consistent with UIP       HRCT 05/2016 -worsening fibrosis compared to 2012   PFTs 11/2010 showed preserved lung volumes with FVC 77% &TLC 80%, DLCO reduced at 49%.  She desatn to 86% on second lap.  02/2011 ONO- 48 min satn <88% >O2 at At bedtime - started O2  10/2011 FVC 82%, does not want to perform full PFTs - don't like that clip over my nose  FVC 84% -preserved -    Past Medical History:  Diagnosis Date  . Colon, diverticulosis Nov 2007  . Hyperlipemia   . Hypertension     . IBS (irritable bowel syndrome)   . Irregular heart rate   . Personal history of colonic polyps 04/10/2006   hyperplastic  . Pulmonary fibrosis ([place])   . Rheumatoid arthritis(714.0)      Review of Systems neg for any significant sore throat, dysphagia, itching, sneezing, nasal congestion or excess/ purulent secretions, fever, chills, sweats, unintended wt loss, pleuritic or exertional cp, hempoptysis, orthopnea pnd or change in chronic leg swelling. Also denies presyncope, palpitations, heartburn, abdominal pain, nausea, vomiting, diarrhea or change in bowel or urinary habits, dysuria,hematuria, rash, arthralgias, visual complaints, headache, numbness weakness or ataxia.     Objective:   Physical Exam  Gen. Pleasant, obese, in no distress ENT - no lesions, no post nasal drip Neck: No JVD, no thyromegaly, no carotid bruits Lungs: no use of accessory muscles, no dullness to percussion, BL 1/3 coarse rales , no rhonchi  Cardiovascular: Rhythm regular, heart sounds  normal, no murmurs or gallops, 1+ peripheral edema Musculoskeletal: No deformities, no cyanosis or clubbing , no tremors       Assessment & Plan:

## 2016-12-12 NOTE — Assessment & Plan Note (Signed)
She does not want any fibrotic therapy UIP pattern may be related to rheumatoid lung -she is on maximal therapy for this including chronic prednisone  For her chronic cough, Refills on cough syrup Benzonatate 200 mg thrice daily as needed for cough

## 2016-12-12 NOTE — Patient Instructions (Signed)
Continue on 3 L oxygen. Refills on cough syrup Benzonatate 200 mg thrice daily as needed for cough

## 2016-12-20 ENCOUNTER — Inpatient Hospital Stay (HOSPITAL_COMMUNITY): Admission: RE | Admit: 2016-12-20 | Payer: BLUE CROSS/BLUE SHIELD | Source: Ambulatory Visit

## 2016-12-30 ENCOUNTER — Ambulatory Visit (HOSPITAL_COMMUNITY)
Admission: RE | Admit: 2016-12-30 | Discharge: 2016-12-30 | Disposition: A | Discharge status: Home / Self Care | Payer: Medicare Other | Source: Ambulatory Visit | Attending: Internal Medicine | Admitting: Internal Medicine

## 2016-12-30 DIAGNOSIS — M0579 Rheumatoid arthritis with rheumatoid factor of multiple sites without organ or systems involvement: Secondary | ICD-10-CM | POA: Diagnosis not present

## 2016-12-30 MED ORDER — RITUXIMAB CHEMO INJECTION 500 MG/50ML
1000.0000 mg | INTRAVENOUS | Status: DC
  Administered 2016-12-30: 10:00:00 1000 mg via INTRAVENOUS
  Filled 2016-12-30: qty 100

## 2016-12-30 MED ORDER — ACETAMINOPHEN 325 MG PO TABS
650.0000 mg | ORAL_TABLET | ORAL | Status: DC
  Administered 2016-12-30: 650 mg via ORAL

## 2016-12-30 MED ORDER — ACETAMINOPHEN 325 MG PO TABS
ORAL_TABLET | ORAL | Status: AC
  Filled 2016-12-30: qty 2

## 2016-12-30 MED ORDER — METHYLPREDNISOLONE SODIUM SUCC 125 MG IJ SOLR
INTRAMUSCULAR | Status: AC
  Filled 2016-12-30: qty 2

## 2016-12-30 MED ORDER — METHYLPREDNISOLONE SODIUM SUCC 125 MG IJ SOLR
100.0000 mg | INTRAMUSCULAR | Status: DC
  Administered 2016-12-30: 09:00:00 100 mg via INTRAVENOUS

## 2016-12-30 MED ORDER — SODIUM CHLORIDE 0.9 % IV SOLN
INTRAVENOUS | Status: DC
  Administered 2016-12-30: 09:00:00 via INTRAVENOUS

## 2017-01-05 DIAGNOSIS — Z6839 Body mass index (BMI) 39.0-39.9, adult: Secondary | ICD-10-CM | POA: Diagnosis not present

## 2017-01-05 DIAGNOSIS — J8410 Pulmonary fibrosis, unspecified: Secondary | ICD-10-CM | POA: Diagnosis not present

## 2017-01-05 DIAGNOSIS — E669 Obesity, unspecified: Secondary | ICD-10-CM | POA: Diagnosis not present

## 2017-01-05 DIAGNOSIS — M150 Primary generalized (osteo)arthritis: Secondary | ICD-10-CM | POA: Diagnosis not present

## 2017-01-05 DIAGNOSIS — M0579 Rheumatoid arthritis with rheumatoid factor of multiple sites without organ or systems involvement: Secondary | ICD-10-CM | POA: Diagnosis not present

## 2017-01-13 ENCOUNTER — Encounter (HOSPITAL_COMMUNITY)
Admission: RE | Admit: 2017-01-13 | Discharge: 2017-01-13 | Disposition: A | Discharge status: Home / Self Care | Payer: Medicare Other | Source: Ambulatory Visit | Attending: Internal Medicine | Admitting: Internal Medicine

## 2017-01-13 DIAGNOSIS — M0579 Rheumatoid arthritis with rheumatoid factor of multiple sites without organ or systems involvement: Secondary | ICD-10-CM | POA: Insufficient documentation

## 2017-01-13 MED ORDER — ACETAMINOPHEN 325 MG PO TABS
650.0000 mg | ORAL_TABLET | ORAL | Status: DC
Start: 2017-01-13 — End: 2017-01-13

## 2017-01-13 MED ORDER — SODIUM CHLORIDE 0.9 % IV SOLN
1000.0000 mg | INTRAVENOUS | Status: AC
  Administered 2017-01-13: 09:00:00 1000 mg via INTRAVENOUS
  Filled 2017-01-13: qty 100

## 2017-01-13 MED ORDER — ACETAMINOPHEN 500 MG PO TABS
1000.0000 mg | ORAL_TABLET | ORAL | Status: DC
Start: 2017-01-13 — End: 2017-01-14

## 2017-01-13 MED ORDER — SODIUM CHLORIDE 0.9 % IV SOLN
INTRAVENOUS | Status: AC
  Administered 2017-01-13: 09:00:00 via INTRAVENOUS

## 2017-01-13 MED ORDER — METHYLPREDNISOLONE SODIUM SUCC 125 MG IJ SOLR
100.0000 mg | INTRAMUSCULAR | Status: AC
Start: 2017-01-13 — End: 2017-01-13
  Administered 2017-01-13: 100 mg via INTRAVENOUS

## 2017-01-13 MED ORDER — METHYLPREDNISOLONE SODIUM SUCC 125 MG IJ SOLR
INTRAMUSCULAR | Status: AC
  Administered 2017-01-13: 100 mg via INTRAVENOUS
  Filled 2017-01-13: qty 2

## 2017-02-13 DIAGNOSIS — Z23 Encounter for immunization: Secondary | ICD-10-CM | POA: Diagnosis not present

## 2017-02-28 DIAGNOSIS — Z1231 Encounter for screening mammogram for malignant neoplasm of breast: Secondary | ICD-10-CM | POA: Diagnosis not present

## 2017-04-13 DIAGNOSIS — M150 Primary generalized (osteo)arthritis: Secondary | ICD-10-CM | POA: Diagnosis not present

## 2017-04-13 DIAGNOSIS — M0579 Rheumatoid arthritis with rheumatoid factor of multiple sites without organ or systems involvement: Secondary | ICD-10-CM | POA: Diagnosis not present

## 2017-04-13 DIAGNOSIS — J8410 Pulmonary fibrosis, unspecified: Secondary | ICD-10-CM | POA: Diagnosis not present

## 2017-04-13 DIAGNOSIS — E669 Obesity, unspecified: Secondary | ICD-10-CM | POA: Diagnosis not present

## 2017-04-13 DIAGNOSIS — Z6839 Body mass index (BMI) 39.0-39.9, adult: Secondary | ICD-10-CM | POA: Diagnosis not present

## 2017-05-03 DIAGNOSIS — I10 Essential (primary) hypertension: Secondary | ICD-10-CM | POA: Diagnosis not present

## 2017-05-03 DIAGNOSIS — K219 Gastro-esophageal reflux disease without esophagitis: Secondary | ICD-10-CM | POA: Diagnosis not present

## 2017-05-03 DIAGNOSIS — E559 Vitamin D deficiency, unspecified: Secondary | ICD-10-CM | POA: Diagnosis not present

## 2017-05-15 DIAGNOSIS — H35012 Changes in retinal vascular appearance, left eye: Secondary | ICD-10-CM | POA: Diagnosis not present

## 2017-05-15 DIAGNOSIS — H4312 Vitreous hemorrhage, left eye: Secondary | ICD-10-CM | POA: Diagnosis not present

## 2017-05-22 DIAGNOSIS — H4312 Vitreous hemorrhage, left eye: Secondary | ICD-10-CM | POA: Diagnosis not present

## 2017-05-23 DIAGNOSIS — H4312 Vitreous hemorrhage, left eye: Secondary | ICD-10-CM | POA: Diagnosis not present

## 2017-06-06 DIAGNOSIS — H4312 Vitreous hemorrhage, left eye: Secondary | ICD-10-CM | POA: Diagnosis not present

## 2017-06-16 ENCOUNTER — Encounter: Payer: Self-pay | Admitting: Adult Health

## 2017-06-16 ENCOUNTER — Ambulatory Visit (INDEPENDENT_AMBULATORY_CARE_PROVIDER_SITE_OTHER): Payer: Medicare Other | Admitting: Adult Health

## 2017-06-16 DIAGNOSIS — J439 Emphysema, unspecified: Secondary | ICD-10-CM | POA: Diagnosis not present

## 2017-06-16 DIAGNOSIS — J9611 Chronic respiratory failure with hypoxia: Secondary | ICD-10-CM

## 2017-06-16 DIAGNOSIS — J8410 Pulmonary fibrosis, unspecified: Secondary | ICD-10-CM | POA: Diagnosis not present

## 2017-06-16 MED ORDER — HYDROCODONE-HOMATROPINE 5-1.5 MG/5ML PO SYRP: 5.0000 mL | ORAL_SOLUTION | Freq: Four times a day (QID) | ORAL | 0 refills | Status: DC | PRN

## 2017-06-16 NOTE — Patient Instructions (Signed)
Continue on current regimen. Continue on Oxygen therapy  2l/m at rest with 3l/m activity .  Follow up Dr. Elsworth Soho in 6 months and As needed

## 2017-06-16 NOTE — Assessment & Plan Note (Signed)
Continue on oxygen 

## 2017-06-16 NOTE — Progress Notes (Signed)
@Patient  ID: Veronica Stone, female    DOB: July 17, 1931, 84 y.o.   MRN: [id number]  Chief Complaint  Patient presents with  . Follow-up    PF     Referring provider: Thressa Sheller, MD  HPI: 85/F, Ex smoker , quit '90 with RA for FU of pulmonary fibrosis.  She has a strong family history - 11-Jul-2022 siblings died of 'fibrosis'  She reports dyspnea on exertion but is more limited by her arthritis. Occasional dry cough +  She smoked 30 Pyrs before quitting in '90. Worked at [employer], retired '88  She reports RA since 2008, tried humira, remicaid, now on rituxan q 70mnths, steroid dependent, on daily prednisone 10mg  ([place])   TEST  Ct abd 8/10 sub pleural fibrosis  CXR 5/11bibasal scarring  PFTs 7/12 showed preserved lung volumes with FVC 77% & TLC 80%, DLCO reduced at 49%.  She desatn to 86% on second lap.  HRCT 6/12 showed diffuse chronic interstitial coarsening with scattered areas of bronchiectasis bilaterally & subpleural honeycombing consistent with UIP  ONO- 48 min satn < 88% >O2 at At bedtime (02/2011 )  - started O2 in oct'12  Trial of Lasix >> not much benefit.  Not interested in rehab  Wonders if her fibrosis is familial but not interested in research study , her sister passed away 10-24-11  2011-11-24 FVC 82%, does not want to perform full PFTs - don't like that clip over my nose  FVC 84% -preserved -   05/2016 HRCT Chest  Pulmonary fibrosis - progressive since 2012 .   06/16/2017 Follow up : Pulmonary Fibrosis , O2 RF  Patient presents for a six-month follow-up.  Patient has known pulmonary fibrosis.  She has underlying rheumatoid arthritis on chronic steroids with prednisone 10 mg daily.  Patient says since last visit her breathing is doing the same.  She does get winded with walking.  She seen no decrease in her activity tolerance.  She remains on oxygen 2 L at rest and 3 L with activity.  O2 saturations have been adequate.  She denies any increased cough shortness of breath  or wheezing.       Allergies  Allergen Reactions  . Dilaudid [Hydromorphone Hcl] Nausea And Vomiting  . Penicillins Itching and Rash  . Statins Itching    Immunization History  Administered Date(s) Administered  . Influenza Split 02/14/2011, 02/20/2012, 02/13/2013  . Influenza, High Dose Seasonal PF 02/08/2016, 02/13/2017  . Influenza-Unspecified 02/13/2014, 03/08/2015  . Pneumococcal Conjugate-13 03/25/2014    Past Medical History:  Diagnosis Date  . Colon, diverticulosis Nov 2007  . Hyperlipemia   . Hypertension   . IBS (irritable bowel syndrome)   . Irregular heart rate   . Personal history of colonic polyps 04/10/2006   hyperplastic  . Pulmonary fibrosis ([place])   . Rheumatoid arthritis(714.0)     Tobacco History: Social History   Tobacco Use  Smoking Status Former Smoker  . Packs/day: 1.00  . Years: 30.00  . Pack years: 30.00  . Types: Cigarettes  . Last attempt to quit: 05/16/1988  . Years since quitting: 29.1  Smokeless Tobacco Never Used   Counseling given: Not Answered   Outpatient Encounter Medications as of 06/16/2017  Medication Sig  . amLODipine (NORVASC) 5 MG tablet Take 2.5 mg by mouth daily.   . cholecalciferol (VITAMIN D) 1000 units tablet Take 1,000 Units by mouth daily.  Marland Kitchen DEXILANT 60 MG capsule Take 1 capsule by mouth as needed (FOR HEARTBURN).   Marland Kitchen  diazepam (VALIUM) 5 MG tablet Take 5 mg by mouth at bedtime as needed. For anxiety/sleep  . estrogens, conjugated, (PREMARIN) 0.625 MG tablet Take 0.625 mg by mouth daily.    . folic acid (FOLVITE) 1 MG tablet Take 1 mg by mouth daily.    . furosemide (LASIX) 20 MG tablet Take 1 tablet by mouth Once daily as needed. For swelling  . HYDROcodone-acetaminophen (NORCO/VICODIN) 5-325 MG tablet Take 1 tablet by mouth every 6 (six) hours as needed.  Marland Kitchen HYDROcodone-homatropine (HYCODAN) 5-1.5 MG/5ML syrup Take 5 mLs by mouth every 6 (six) hours as needed for cough.  . irbesartan (AVAPRO) 300 MG tablet  Take 1 tablet by mouth daily.  Marland Kitchen loratadine (CLARITIN) 10 MG tablet Take 10 mg by mouth daily.  Vladimir Faster Glycol-Propyl Glycol (SYSTANE) 0.4-0.3 % SOLN Apply 1 drop to eye daily. For dry eyes  . predniSONE (DELTASONE) 10 MG tablet Take 1 tablet by mouth daily.  . RiTUXimab (RITUXAN IV) Inject 2 application into the vein once. TAKES 2 DOSES IN ONE MONTH TIME, THEN DOESN'T TAKEN AGAIN FOR 6 MONTHS  . [DISCONTINUED] HYDROcodone-homatropine (HYCODAN) 5-1.5 MG/5ML syrup Take 5 mLs by mouth every 6 (six) hours as needed for cough.   No facility-administered encounter medications on file as of 06/16/2017.      Review of Systems  Constitutional:   No  weight loss, night sweats,  Fevers, chills,  +fatigue, or  lassitude.  HEENT:   No headaches,  Difficulty swallowing,  Tooth/dental problems, or  Sore throat,                No sneezing, itching, ear ache, nasal congestion, post nasal drip,   CV:  No chest pain,  Orthopnea, PND,  anasarca, dizziness, palpitations, syncope.   GI  No heartburn, indigestion, abdominal pain, nausea, vomiting, diarrhea, change in bowel habits, loss of appetite, bloody stools.   Resp:   No chest wall deformity  Skin: no rash or lesions.  GU: no dysuria, change in color of urine, no urgency or frequency.  No flank pain, no hematuria   MS:  No joint pain or swelling.  No decreased range of motion.  No back pain.    Physical Exam  BP 130/64 (BP Location: Left Arm, Cuff Size: Normal)   Pulse 95   Ht 5\' 3"  (1.6 m)   Wt 212 lb (96.2 kg)   SpO2 93%   BMI 37.55 kg/m   GEN: A/Ox3; pleasant , NAD, morbidly obese   HEENT:  [place]/AT,  EACs-clear, TMs-wnl, NOSE-clear, THROAT-clear, no lesions, no postnasal drip or exudate noted.   NECK:  Supple w/ fair ROM; no JVD; normal carotid impulses w/o bruits; no thyromegaly or nodules palpated; no lymphadenopathy.    RESP bibasilar crackles. no accessory muscle use, no dullness to percussion  CARD:  RRR, no m/r/g, trace  peripheral edema, pulses intact, no cyanosis or clubbing.  GI:   Soft & nt; nml bowel sounds; no organomegaly or masses detected.   Musco: Warm bil, no deformities or joint swelling noted.   Neuro: alert, no focal deficits noted.    Skin: Warm, no lesions or rashes    Lab Results:  CBC   BNP No results found for: BNP  ProBNP No results found for: PROBNP  Imaging: No results found.   Assessment & Plan:   Pulmonary emphysema with fibrosis of lung ([place]) Stable  Plan  . Patient Instructions  Continue on current regimen. Continue on Oxygen therapy  2l/m at  rest with 3l/m activity .  Follow up Dr. Elsworth Soho in 6 months and As needed       Chronic respiratory failure ([place]) Continue on oxygen     Rexene Edison, NP 06/16/2017

## 2017-06-16 NOTE — Assessment & Plan Note (Signed)
Stable  Plan  . Patient Instructions  Continue on current regimen. Continue on Oxygen therapy  2l/m at rest with 3l/m activity .  Follow up Dr. Elsworth Soho in 6 months and As needed

## 2017-07-17 DIAGNOSIS — M150 Primary generalized (osteo)arthritis: Secondary | ICD-10-CM | POA: Diagnosis not present

## 2017-07-17 DIAGNOSIS — J8410 Pulmonary fibrosis, unspecified: Secondary | ICD-10-CM | POA: Diagnosis not present

## 2017-07-17 DIAGNOSIS — M0579 Rheumatoid arthritis with rheumatoid factor of multiple sites without organ or systems involvement: Secondary | ICD-10-CM | POA: Diagnosis not present

## 2017-07-17 DIAGNOSIS — E669 Obesity, unspecified: Secondary | ICD-10-CM | POA: Diagnosis not present

## 2017-07-17 DIAGNOSIS — Z6838 Body mass index (BMI) 38.0-38.9, adult: Secondary | ICD-10-CM | POA: Diagnosis not present

## 2017-07-20 ENCOUNTER — Other Ambulatory Visit (HOSPITAL_COMMUNITY): Payer: Self-pay

## 2017-07-21 ENCOUNTER — Ambulatory Visit (HOSPITAL_COMMUNITY)
Admission: RE | Admit: 2017-07-21 | Discharge: 2017-07-21 | Disposition: A | Discharge status: Home / Self Care | Payer: Medicare Other | Source: Ambulatory Visit | Attending: Internal Medicine | Admitting: Internal Medicine

## 2017-07-21 DIAGNOSIS — M0579 Rheumatoid arthritis with rheumatoid factor of multiple sites without organ or systems involvement: Secondary | ICD-10-CM | POA: Diagnosis not present

## 2017-07-21 LAB — COMPREHENSIVE METABOLIC PANEL
ALBUMIN: 3.6 g/dL (ref 3.5–5.0)
ALT: 16 U/L (ref 14–54)
AST: 32 U/L (ref 15–41)
Alkaline Phosphatase: 56 U/L (ref 38–126)
Anion gap: 14 (ref 5–15)
BUN: 12 mg/dL (ref 6–20)
CHLORIDE: 101 mmol/L (ref 101–111)
CO2: 24 mmol/L (ref 22–32)
CREATININE: 0.93 mg/dL (ref 0.44–1.00)
Calcium: 9.6 mg/dL (ref 8.9–10.3)
GFR calc Af Amer: >60 mL/min (ref >60)
GFR, EST NON AFRICAN AMERICAN: 55 mL/min — AB (ref >60)
GLUCOSE: 105 mg/dL — AB (ref 65–99)
Potassium: 4 mmol/L (ref 3.5–5.1)
Sodium: 139 mmol/L (ref 135–145)
Total Bilirubin: 0.9 mg/dL (ref 0.3–1.2)
Total Protein: 6.3 g/dL — ABNORMAL LOW (ref 6.5–8.1)

## 2017-07-21 LAB — CBC
HCT: 44.3 % (ref 36.0–46.0)
Hemoglobin: 14.3 g/dL (ref 12.0–15.0)
MCH: 28.9 pg (ref 26.0–34.0)
MCHC: 32.3 g/dL (ref 30.0–36.0)
MCV: 89.5 fL (ref 78.0–100.0)
Platelets: 288 10*3/uL (ref 150–400)
RBC: 4.95 MIL/uL (ref 3.87–5.11)
RDW: 13.8 % (ref 11.5–15.5)
WBC: 14.2 10*3/uL — AB (ref 4.0–10.5)

## 2017-07-21 MED ORDER — METHYLPREDNISOLONE SODIUM SUCC 125 MG IJ SOLR
INTRAMUSCULAR | Status: AC
  Filled 2017-07-21: qty 2

## 2017-07-21 MED ORDER — ACETAMINOPHEN 325 MG PO TABS
ORAL_TABLET | ORAL | Status: AC
  Filled 2017-07-21: qty 2

## 2017-07-21 MED ORDER — ACETAMINOPHEN 325 MG PO TABS
650.0000 mg | ORAL_TABLET | Freq: Once | ORAL | Status: DC
  Administered 2017-07-21: 650 mg via ORAL

## 2017-07-21 MED ORDER — SODIUM CHLORIDE 0.9 % IV SOLN
1000.0000 mg | Freq: Once | INTRAVENOUS | Status: DC
  Administered 2017-07-21: 1000 mg via INTRAVENOUS
  Filled 2017-07-21: qty 100

## 2017-07-21 MED ORDER — METHYLPREDNISOLONE SODIUM SUCC 125 MG IJ SOLR
100.0000 mg | Freq: Once | INTRAMUSCULAR | Status: DC
  Administered 2017-07-21: 100 mg via INTRAVENOUS

## 2017-08-04 ENCOUNTER — Ambulatory Visit (HOSPITAL_COMMUNITY)
Admission: RE | Admit: 2017-08-04 | Discharge: 2017-08-04 | Disposition: A | Discharge status: Home / Self Care | Payer: Medicare Other | Source: Ambulatory Visit | Attending: Internal Medicine | Admitting: Internal Medicine

## 2017-08-04 DIAGNOSIS — Z5111 Encounter for antineoplastic chemotherapy: Secondary | ICD-10-CM | POA: Insufficient documentation

## 2017-08-04 DIAGNOSIS — M0579 Rheumatoid arthritis with rheumatoid factor of multiple sites without organ or systems involvement: Secondary | ICD-10-CM | POA: Insufficient documentation

## 2017-08-04 MED ORDER — METHYLPREDNISOLONE SODIUM SUCC 125 MG IJ SOLR
INTRAMUSCULAR | Status: AC
  Administered 2017-08-04: 100 mg via INTRAVENOUS
  Filled 2017-08-04: qty 2

## 2017-08-04 MED ORDER — ACETAMINOPHEN 325 MG PO TABS
650.0000 mg | ORAL_TABLET | Freq: Once | ORAL | Status: AC
  Administered 2017-08-04: 650 mg via ORAL

## 2017-08-04 MED ORDER — ACETAMINOPHEN 325 MG PO TABS
ORAL_TABLET | ORAL | Status: AC
  Administered 2017-08-04: 650 mg via ORAL
  Filled 2017-08-04: qty 2

## 2017-08-04 MED ORDER — METHYLPREDNISOLONE SODIUM SUCC 125 MG IJ SOLR
100.0000 mg | Freq: Once | INTRAMUSCULAR | Status: AC
  Administered 2017-08-04: 100 mg via INTRAVENOUS

## 2017-08-04 MED ORDER — SODIUM CHLORIDE 0.9 % IV SOLN
1000.0000 mg | Freq: Once | INTRAVENOUS | Status: AC
  Administered 2017-08-04: 1000 mg via INTRAVENOUS
  Filled 2017-08-04: qty 100

## 2017-08-18 ENCOUNTER — Encounter (HOSPITAL_COMMUNITY): Payer: BLUE CROSS/BLUE SHIELD

## 2017-09-07 DIAGNOSIS — H4312 Vitreous hemorrhage, left eye: Secondary | ICD-10-CM | POA: Diagnosis not present

## 2017-10-16 DIAGNOSIS — M0579 Rheumatoid arthritis with rheumatoid factor of multiple sites without organ or systems involvement: Secondary | ICD-10-CM | POA: Diagnosis not present

## 2017-10-16 DIAGNOSIS — J8410 Pulmonary fibrosis, unspecified: Secondary | ICD-10-CM | POA: Diagnosis not present

## 2017-10-16 DIAGNOSIS — E669 Obesity, unspecified: Secondary | ICD-10-CM | POA: Diagnosis not present

## 2017-10-16 DIAGNOSIS — Z6837 Body mass index (BMI) 37.0-37.9, adult: Secondary | ICD-10-CM | POA: Diagnosis not present

## 2017-10-16 DIAGNOSIS — M150 Primary generalized (osteo)arthritis: Secondary | ICD-10-CM | POA: Diagnosis not present

## 2017-10-31 ENCOUNTER — Telehealth: Payer: Self-pay | Admitting: Pulmonary Disease

## 2017-10-31 MED ORDER — HYDROCODONE-HOMATROPINE 5-1.5 MG/5ML PO SYRP: 5.0000 mL | ORAL_SOLUTION | Freq: Four times a day (QID) | ORAL | 0 refills | Status: DC | PRN

## 2017-10-31 NOTE — Telephone Encounter (Signed)
Okay to refill. Please ensure she has follow-up appointment

## 2017-10-31 NOTE — Telephone Encounter (Signed)
Attempted to contact pt. Call went straight to voicemail. I have left a message for the pt to return our call. 

## 2017-10-31 NOTE — Telephone Encounter (Signed)
Spoke with pt. She is requesting a refill on Hycodan. This was last given to her at her last visit with Tammy on 06/16/17 for #123mL.  Dr. Elsworth Soho - please advise. Thanks.

## 2017-10-31 NOTE — Telephone Encounter (Signed)
Pt is calling back [phone]

## 2017-10-31 NOTE — Telephone Encounter (Signed)
Rx has been printed and given to Cherina to have RA sign. Pt is aware that this prescription will be ready this afternoon. Nothing further was needed.

## 2017-11-03 DIAGNOSIS — E039 Hypothyroidism, unspecified: Secondary | ICD-10-CM | POA: Diagnosis not present

## 2017-11-03 DIAGNOSIS — E559 Vitamin D deficiency, unspecified: Secondary | ICD-10-CM | POA: Diagnosis not present

## 2017-11-03 DIAGNOSIS — I129 Hypertensive chronic kidney disease with stage 1 through stage 4 chronic kidney disease, or unspecified chronic kidney disease: Secondary | ICD-10-CM | POA: Diagnosis not present

## 2017-11-03 DIAGNOSIS — E785 Hyperlipidemia, unspecified: Secondary | ICD-10-CM | POA: Diagnosis not present

## 2017-11-08 DIAGNOSIS — E785 Hyperlipidemia, unspecified: Secondary | ICD-10-CM | POA: Diagnosis not present

## 2017-11-08 DIAGNOSIS — J8410 Pulmonary fibrosis, unspecified: Secondary | ICD-10-CM | POA: Diagnosis not present

## 2017-11-08 DIAGNOSIS — J9611 Chronic respiratory failure with hypoxia: Secondary | ICD-10-CM | POA: Diagnosis not present

## 2017-11-08 DIAGNOSIS — R739 Hyperglycemia, unspecified: Secondary | ICD-10-CM | POA: Diagnosis not present

## 2017-11-08 DIAGNOSIS — I10 Essential (primary) hypertension: Secondary | ICD-10-CM | POA: Diagnosis not present

## 2017-11-08 DIAGNOSIS — Z0000 Encounter for general adult medical examination without abnormal findings: Secondary | ICD-10-CM | POA: Diagnosis not present

## 2017-11-08 DIAGNOSIS — K219 Gastro-esophageal reflux disease without esophagitis: Secondary | ICD-10-CM | POA: Diagnosis not present

## 2017-11-08 DIAGNOSIS — D72829 Elevated white blood cell count, unspecified: Secondary | ICD-10-CM | POA: Diagnosis not present

## 2017-12-18 ENCOUNTER — Encounter: Payer: Self-pay | Admitting: Pulmonary Disease

## 2017-12-18 ENCOUNTER — Ambulatory Visit (INDEPENDENT_AMBULATORY_CARE_PROVIDER_SITE_OTHER): Payer: Medicare Other | Admitting: Pulmonary Disease

## 2017-12-18 ENCOUNTER — Ambulatory Visit (INDEPENDENT_AMBULATORY_CARE_PROVIDER_SITE_OTHER)
Admission: RE | Admit: 2017-12-18 | Discharge: 2017-12-18 | Disposition: A | Discharge status: Home / Self Care | Payer: Medicare Other | Source: Ambulatory Visit | Attending: Pulmonary Disease | Admitting: Pulmonary Disease

## 2017-12-18 VITALS — BP 120/78 | HR 90 | Ht 63.0 in | Wt 209.0 lb

## 2017-12-18 DIAGNOSIS — J9611 Chronic respiratory failure with hypoxia: Secondary | ICD-10-CM | POA: Diagnosis not present

## 2017-12-18 DIAGNOSIS — J8410 Pulmonary fibrosis, unspecified: Secondary | ICD-10-CM

## 2017-12-18 DIAGNOSIS — J439 Emphysema, unspecified: Secondary | ICD-10-CM

## 2017-12-18 DIAGNOSIS — R0602 Shortness of breath: Secondary | ICD-10-CM | POA: Diagnosis not present

## 2017-12-18 MED ORDER — HYDROCODONE-HOMATROPINE 5-1.5 MG/5ML PO SYRP: 5.0000 mL | ORAL_SOLUTION | Freq: Every evening | ORAL | 0 refills | Status: DC | PRN

## 2017-12-18 NOTE — Progress Notes (Addendum)
Veronica Stone    [id number]    12-Dec-1931  Primary Care Physician:Veronica Stone, Veronica Edelman, MD  Referring Physician: Thressa Sheller, MD [address], [place], [place] [address]  Chief complaint:  Pulmonary fibrosis  HPI: [name] is a 84 yo F w/ PMH of HTN, HLD, IBS and RA presenting to the clinic for management of his pulmonary fibrosis. She states that she has been fatigued and felt like she 'has lack of energy' as well as having difficulty walking. She states her arthritic pain is bearable but her lack of energy and 'heavy leg' are bothersome. She states this is her baseline but it makes her wonder if she will require a home health aid soon although she wishes to avoid that. She also is endorsing a dry cough which is chronic and unchanged in severity. She is continuing to take her prednisone and lasix as prescribed and using her home oxygen without any complications. She continues to endorse 2 pillow orthopnea. Denies any Fever, Nausea, Chest pain, Palpitations, Wheeze, Hemoptysis.  Smoking history: Ex smoker (tobacco free since 1990s) Relevant family history: 2 siblings died of 'pulmonary fibrosis'  Allergies as of 12/18/2017 - Review Complete 12/18/2017  Allergen Reaction Noted  . Dilaudid [hydromorphone hcl] Nausea And Vomiting 12/09/2011  . Penicillins Itching and Rash 07/25/2014  . Statins Itching 01/12/2009    Past Medical History:  Diagnosis Date  . Colon, diverticulosis Nov 2007  . Hyperlipemia   . Hypertension   . IBS (irritable bowel syndrome)   . Irregular heart rate   . Personal history of colonic polyps 04/10/2006   hyperplastic  . Pulmonary fibrosis ([place])   . Rheumatoid arthritis(714.0)     Past Surgical History:  Procedure Laterality Date  . ABDOMINAL HYSTERECTOMY    . APPENDECTOMY    . CHOLECYSTECTOMY    . CYSTECTOMY     left breast     Review of systems: Review of Systems  Constitutional: Negative for fever and chills.    HENT: Negative.   Eyes: Negative for blurred vision.  Respiratory: as per HPI  Cardiovascular: Negative for chest pain and palpitations.  Gastrointestinal: Negative for vomiting, diarrhea, blood per rectum. Genitourinary: Negative for dysuria, urgency, frequency and hematuria.  Musculoskeletal: Negative for myalgias, back pain and joint pain.  Skin: Negative for itching and rash.  Neurological: Negative for dizziness, tremors, focal weakness, seizures and loss of consciousness.  Endo/Heme/Allergies: Negative for environmental allergies.  Psychiatric/Behavioral: Negative for depression, suicidal ideas and hallucinations.  All other systems reviewed and are negative.  Physical Exam: Blood pressure 120/78, pulse 90, height 5\' 3"  (1.6 m), weight 209 lb (94.8 kg), SpO2 95 %. Gen:      Obese Caucasian F in no acute distress with O2 cannula on 2L Neck:     No masses; no thyromegaly Lungs:    Dry crackles on lower lobes bilaterally. No wheezes. Normal respiratory effort CV:         Regular rate and rhythm; no murmurs Ext:    Significant non-pitting edema bilateral lower extremities Skin:      Warm and dry; no rash Neuro: alert and oriented x 3 Psych: normal mood and affect  Assessment:  Refer to Encounters tab for problem based charting  Plan/Recommendations: Refer to Encounters tab for problem based charting  Veronica Stone, PGY1 Ver[facility] Stone [facility] 12/18/2017, 3:00 PM  CC: Veronica Sheller, MD  Independently examined pt, evaluated data & formulated above care plan with  NP/resident   84 year old woman with rheumatoid arthritis and pulmonary fibrosis/UIP pattern. Her dyspnea appears to be stable.  Cough is chronic and persistent, Hycodan and has given her relief. In spite of being on Rituxan injections, she requires 10 mg of prednisone.  This is being titrated by rheumatology. She is unable willing to perform PFTs.  Fibrosis appears stable based on imaging and symptoms,  CXR today shows stable fibrotic changes  Veronica V. Elsworth Soho MD

## 2017-12-18 NOTE — Patient Instructions (Addendum)
CXR today Refill on hycodan Fibrosis appears stable

## 2017-12-18 NOTE — Assessment & Plan Note (Signed)
Patient presenting with chronic respiratory failure for home oxygen 2/2 pulmonary fibrosis. Currently stable without exacerbations. Patient's difficulty with ambulation may be exacerbated by her lower extremity edema and heavy legs in the setting of respiratory failure - C/w home oxygen regimen - Start hycodan syrup for chronic cough

## 2017-12-18 NOTE — Assessment & Plan Note (Signed)
Patient presenting with pulmonary fibrosis diagnosed in 2011 2/2 rheumatoid lung - Chest X-ray today - C/w Home Oxygen therapy 2L at rest, 3L with activity

## 2018-01-19 ENCOUNTER — Other Ambulatory Visit (HOSPITAL_COMMUNITY): Payer: Self-pay

## 2018-01-22 ENCOUNTER — Encounter (HOSPITAL_COMMUNITY)
Admission: RE | Admit: 2018-01-22 | Discharge: 2018-01-22 | Disposition: A | Discharge status: Home / Self Care | Payer: Medicare Other | Source: Ambulatory Visit | Attending: Internal Medicine | Admitting: Internal Medicine

## 2018-01-22 DIAGNOSIS — M0579 Rheumatoid arthritis with rheumatoid factor of multiple sites without organ or systems involvement: Secondary | ICD-10-CM | POA: Insufficient documentation

## 2018-01-22 MED ORDER — ACETAMINOPHEN 325 MG PO TABS
650.0000 mg | ORAL_TABLET | Freq: Once | ORAL | Status: DC
  Administered 2018-01-22: 650 mg via ORAL

## 2018-01-22 MED ORDER — SODIUM CHLORIDE 0.9 % IV SOLN
1000.0000 mg | Freq: Once | INTRAVENOUS | Status: DC
  Administered 2018-01-22: 10:00:00 1000 mg via INTRAVENOUS
  Filled 2018-01-22: qty 100

## 2018-01-22 MED ORDER — METHYLPREDNISOLONE SODIUM SUCC 125 MG IJ SOLR
INTRAMUSCULAR | Status: AC
  Administered 2018-01-22: 100 mg via INTRAVENOUS
  Filled 2018-01-22: qty 2

## 2018-01-22 MED ORDER — METHYLPREDNISOLONE SODIUM SUCC 125 MG IJ SOLR
100.0000 mg | Freq: Once | INTRAMUSCULAR | Status: DC
  Administered 2018-01-22: 100 mg via INTRAVENOUS

## 2018-01-22 MED ORDER — ACETAMINOPHEN 325 MG PO TABS
ORAL_TABLET | ORAL | Status: AC
  Administered 2018-01-22: 650 mg via ORAL
  Filled 2018-01-22: qty 2

## 2018-02-05 ENCOUNTER — Ambulatory Visit (HOSPITAL_COMMUNITY)
Admission: RE | Admit: 2018-02-05 | Discharge: 2018-02-05 | Disposition: A | Discharge status: Home / Self Care | Payer: Medicare Other | Source: Ambulatory Visit | Attending: Internal Medicine | Admitting: Internal Medicine

## 2018-02-05 DIAGNOSIS — M069 Rheumatoid arthritis, unspecified: Secondary | ICD-10-CM | POA: Insufficient documentation

## 2018-02-05 MED ORDER — METHYLPREDNISOLONE SODIUM SUCC 125 MG IJ SOLR
INTRAMUSCULAR | Status: AC
  Administered 2018-02-05: 100 mg via INTRAVENOUS
  Filled 2018-02-05: qty 2

## 2018-02-05 MED ORDER — METHYLPREDNISOLONE SODIUM SUCC 125 MG IJ SOLR
100.0000 mg | Freq: Once | INTRAMUSCULAR | Status: AC
  Administered 2018-02-05: 100 mg via INTRAVENOUS

## 2018-02-05 MED ORDER — ACETAMINOPHEN 325 MG PO TABS
650.0000 mg | ORAL_TABLET | Freq: Once | ORAL | Status: AC
  Administered 2018-02-05: 650 mg via ORAL

## 2018-02-05 MED ORDER — SODIUM CHLORIDE 0.9 % IV SOLN
1000.0000 mg | Freq: Once | INTRAVENOUS | Status: AC
  Administered 2018-02-05: 1000 mg via INTRAVENOUS
  Filled 2018-02-05: qty 100

## 2018-02-05 MED ORDER — ACETAMINOPHEN 325 MG PO TABS
ORAL_TABLET | ORAL | Status: AC
  Administered 2018-02-05: 650 mg via ORAL
  Filled 2018-02-05: qty 2

## 2018-02-17 DIAGNOSIS — Z23 Encounter for immunization: Secondary | ICD-10-CM | POA: Diagnosis not present

## 2018-03-13 DIAGNOSIS — M150 Primary generalized (osteo)arthritis: Secondary | ICD-10-CM | POA: Diagnosis not present

## 2018-03-13 DIAGNOSIS — M0579 Rheumatoid arthritis with rheumatoid factor of multiple sites without organ or systems involvement: Secondary | ICD-10-CM | POA: Diagnosis not present

## 2018-03-13 DIAGNOSIS — Z6837 Body mass index (BMI) 37.0-37.9, adult: Secondary | ICD-10-CM | POA: Diagnosis not present

## 2018-03-13 DIAGNOSIS — J8410 Pulmonary fibrosis, unspecified: Secondary | ICD-10-CM | POA: Diagnosis not present

## 2018-03-13 DIAGNOSIS — E669 Obesity, unspecified: Secondary | ICD-10-CM | POA: Diagnosis not present

## 2018-05-17 DIAGNOSIS — F419 Anxiety disorder, unspecified: Secondary | ICD-10-CM | POA: Diagnosis not present

## 2018-05-17 DIAGNOSIS — K219 Gastro-esophageal reflux disease without esophagitis: Secondary | ICD-10-CM | POA: Diagnosis not present

## 2018-05-17 DIAGNOSIS — I10 Essential (primary) hypertension: Secondary | ICD-10-CM | POA: Diagnosis not present

## 2018-05-24 DIAGNOSIS — F419 Anxiety disorder, unspecified: Secondary | ICD-10-CM | POA: Diagnosis not present

## 2018-05-24 DIAGNOSIS — G8929 Other chronic pain: Secondary | ICD-10-CM | POA: Diagnosis not present

## 2018-05-24 DIAGNOSIS — J9611 Chronic respiratory failure with hypoxia: Secondary | ICD-10-CM | POA: Diagnosis not present

## 2018-06-13 DIAGNOSIS — E669 Obesity, unspecified: Secondary | ICD-10-CM | POA: Diagnosis not present

## 2018-06-13 DIAGNOSIS — M0579 Rheumatoid arthritis with rheumatoid factor of multiple sites without organ or systems involvement: Secondary | ICD-10-CM | POA: Diagnosis not present

## 2018-06-13 DIAGNOSIS — Z6836 Body mass index (BMI) 36.0-36.9, adult: Secondary | ICD-10-CM | POA: Diagnosis not present

## 2018-06-13 DIAGNOSIS — M25512 Pain in left shoulder: Secondary | ICD-10-CM | POA: Diagnosis not present

## 2018-06-13 DIAGNOSIS — J8410 Pulmonary fibrosis, unspecified: Secondary | ICD-10-CM | POA: Diagnosis not present

## 2018-06-13 DIAGNOSIS — M150 Primary generalized (osteo)arthritis: Secondary | ICD-10-CM | POA: Diagnosis not present

## 2018-06-27 ENCOUNTER — Encounter: Payer: Self-pay | Admitting: Pulmonary Disease

## 2018-06-27 ENCOUNTER — Ambulatory Visit (INDEPENDENT_AMBULATORY_CARE_PROVIDER_SITE_OTHER)
Admission: RE | Admit: 2018-06-27 | Discharge: 2018-06-27 | Disposition: A | Discharge status: Home / Self Care | Payer: Medicare Other | Source: Ambulatory Visit | Attending: Pulmonary Disease | Admitting: Pulmonary Disease

## 2018-06-27 ENCOUNTER — Ambulatory Visit (INDEPENDENT_AMBULATORY_CARE_PROVIDER_SITE_OTHER): Payer: Medicare Other | Admitting: Pulmonary Disease

## 2018-06-27 VITALS — BP 118/70 | HR 67 | Ht 63.0 in | Wt 200.0 lb

## 2018-06-27 DIAGNOSIS — J8410 Pulmonary fibrosis, unspecified: Secondary | ICD-10-CM

## 2018-06-27 DIAGNOSIS — J9611 Chronic respiratory failure with hypoxia: Secondary | ICD-10-CM | POA: Diagnosis not present

## 2018-06-27 DIAGNOSIS — J439 Emphysema, unspecified: Secondary | ICD-10-CM

## 2018-06-27 NOTE — Patient Instructions (Signed)
CXR today.  

## 2018-06-27 NOTE — Assessment & Plan Note (Signed)
Unfortunately there is very difficult to objectively assess whether she is worse or not.  Fibrosis seems to have worsened in 2018 compared to 2012 but I am not sure there has been progression since then.  Subjectively she feels worse although her oxygen requirements have not worsened  Chest x-ray today shows changes of fibrosis as prior

## 2018-06-27 NOTE — Assessment & Plan Note (Signed)
Continue on oxygen 

## 2018-06-27 NOTE — Progress Notes (Signed)
   Subjective:    Patient ID: Veronica Stone, female    DOB: Jul 19, 1931, 85 y.o.   MRN: [id number]  HPI  85 year old woman with rheumatoid arthritis and pulmonary fibrosis/UIP pattern & chronic resp failure on O2 She is on Rituxan and 10 mg of prednisone.  She feels like she has been getting worse the past 6 months. Pedal edema is at baseline she takes Lasix as needed. Cough is improved, she is now off Hycodan cough syrup and uses over-the-counter Robitussin-DM which seems to be just as helpful.  She remains on 10 mg of prednisone.  We reviewed her prior imaging.  She is unable to perform PFTs so we do not have objective data  Significant tests/ events reviewed  Ct abd 12/2008 sub pleural fibrosis  HRCT 10/2010 showed diffuse chronic interstitial coarsening with scattered areas of bronchiectasis bilaterally &subpleural honeycombing consistent with UIP       HRCT 05/2016 -worsening fibrosis compared to 2012   PFTs 11/2010 showed preserved lung volumes with FVC 77% &TLC 80%, DLCO reduced at 49%.  She desatn to 86% on second lap.  02/2011 ONO- 48 min satn <88% >O2 at At bedtime - started O2  10/2011 FVC 82%, does not want to perform full PFTs - don't like that clip over my nose  FVC 84% -preserved -   Review of Systems neg for any significant sore throat, dysphagia, itching, sneezing, nasal congestion or excess/ purulent secretions, fever, chills, sweats, unintended wt loss, pleuritic or exertional cp, hempoptysis, orthopnea pnd or change in chronic leg swelling. Also denies presyncope, palpitations, heartburn, abdominal pain, nausea, vomiting, diarrhea or change in bowel or urinary habits, dysuria,hematuria, rash, arthralgias, visual complaints, headache, numbness weakness or ataxia.     Objective:   Physical Exam  Gen. Pleasant, obese, in no distress ENT - no lesions, no post nasal drip Neck: No JVD, no thyromegaly, no carotid bruits Lungs: no use of accessory muscles, no  dullness to percussion, bibasal 1/3 rales or rhonchi  Cardiovascular: Rhythm regular, heart sounds  normal, no murmurs or gallops, no peripheral edema Musculoskeletal: No deformities, no cyanosis or clubbing , no tremors       Assessment & Plan:

## 2018-07-20 ENCOUNTER — Other Ambulatory Visit (HOSPITAL_COMMUNITY): Payer: Self-pay | Admitting: *Deleted

## 2018-07-23 ENCOUNTER — Ambulatory Visit (HOSPITAL_COMMUNITY)
Admission: RE | Admit: 2018-07-23 | Discharge: 2018-07-23 | Disposition: A | Discharge status: Home / Self Care | Payer: Medicare Other | Source: Ambulatory Visit | Attending: Internal Medicine | Admitting: Internal Medicine

## 2018-07-23 ENCOUNTER — Other Ambulatory Visit: Payer: Self-pay

## 2018-07-23 DIAGNOSIS — M0579 Rheumatoid arthritis with rheumatoid factor of multiple sites without organ or systems involvement: Secondary | ICD-10-CM | POA: Insufficient documentation

## 2018-07-23 LAB — COMPREHENSIVE METABOLIC PANEL
ALT: 15 U/L (ref 0–44)
ANION GAP: 11 (ref 5–15)
AST: 31 U/L (ref 15–41)
Albumin: 3.6 g/dL (ref 3.5–5.0)
Alkaline Phosphatase: 57 U/L (ref 38–126)
BUN: 15 mg/dL (ref 8–23)
CALCIUM: 9.2 mg/dL (ref 8.9–10.3)
CHLORIDE: 101 mmol/L (ref 98–111)
CO2: 24 mmol/L (ref 22–32)
Creatinine, Ser: 0.9 mg/dL (ref 0.44–1.00)
GFR calc Af Amer: >60 mL/min (ref >60)
GFR, EST NON AFRICAN AMERICAN: 59 mL/min — AB (ref >60)
Glucose, Bld: 102 mg/dL — ABNORMAL HIGH (ref 70–99)
Potassium: 4.1 mmol/L (ref 3.5–5.1)
SODIUM: 136 mmol/L (ref 135–145)
Total Bilirubin: 1.1 mg/dL (ref 0.3–1.2)
Total Protein: 5.8 g/dL — ABNORMAL LOW (ref 6.5–8.1)

## 2018-07-23 LAB — CBC
HCT: 44.1 % (ref 36.0–46.0)
Hemoglobin: 14 g/dL (ref 12.0–15.0)
MCH: 28.1 pg (ref 26.0–34.0)
MCHC: 31.7 g/dL (ref 30.0–36.0)
MCV: 88.4 fL (ref 80.0–100.0)
PLATELETS: 303 10*3/uL (ref 150–400)
RBC: 4.99 MIL/uL (ref 3.87–5.11)
RDW: 13.2 % (ref 11.5–15.5)
WBC: 13.3 10*3/uL — ABNORMAL HIGH (ref 4.0–10.5)
nRBC: 0 % (ref 0.0–0.2)

## 2018-07-23 MED ORDER — METHYLPREDNISOLONE SODIUM SUCC 125 MG IJ SOLR
100.0000 mg | Freq: Once | INTRAMUSCULAR | Status: AC
  Administered 2018-07-23: 100 mg via INTRAVENOUS

## 2018-07-23 MED ORDER — SODIUM CHLORIDE 0.9 % IV SOLN
1000.0000 mg | Freq: Once | INTRAVENOUS | Status: AC
  Administered 2018-07-23: 1000 mg via INTRAVENOUS
  Filled 2018-07-23: qty 100

## 2018-07-23 MED ORDER — ACETAMINOPHEN 325 MG PO TABS
ORAL_TABLET | ORAL | Status: AC
  Filled 2018-07-23: qty 2

## 2018-07-23 MED ORDER — METHYLPREDNISOLONE SODIUM SUCC 125 MG IJ SOLR
INTRAMUSCULAR | Status: AC
  Administered 2018-07-23: 100 mg via INTRAVENOUS
  Filled 2018-07-23: qty 2

## 2018-07-23 MED ORDER — ACETAMINOPHEN 325 MG PO TABS
650.0000 mg | ORAL_TABLET | Freq: Once | ORAL | Status: AC
  Administered 2018-07-23: 650 mg via ORAL

## 2018-08-06 ENCOUNTER — Other Ambulatory Visit: Payer: Self-pay

## 2018-08-06 ENCOUNTER — Encounter (HOSPITAL_COMMUNITY)
Admission: RE | Admit: 2018-08-06 | Discharge: 2018-08-06 | Disposition: A | Discharge status: Home / Self Care | Payer: Medicare Other | Source: Ambulatory Visit | Attending: Internal Medicine | Admitting: Internal Medicine

## 2018-08-06 DIAGNOSIS — M0579 Rheumatoid arthritis with rheumatoid factor of multiple sites without organ or systems involvement: Secondary | ICD-10-CM | POA: Insufficient documentation

## 2018-08-06 MED ORDER — SODIUM CHLORIDE 0.9 % IV SOLN
1000.0000 mg | Freq: Once | INTRAVENOUS | Status: AC
  Administered 2018-08-06: 1000 mg via INTRAVENOUS
  Filled 2018-08-06: qty 100

## 2018-08-06 MED ORDER — ACETAMINOPHEN 325 MG PO TABS
650.0000 mg | ORAL_TABLET | Freq: Once | ORAL | Status: AC
  Administered 2018-08-06: 650 mg via ORAL

## 2018-08-06 MED ORDER — METHYLPREDNISOLONE SODIUM SUCC 125 MG IJ SOLR
100.0000 mg | Freq: Once | INTRAMUSCULAR | Status: AC
  Administered 2018-08-06: 100 mg via INTRAVENOUS

## 2018-08-06 MED ORDER — ACETAMINOPHEN 325 MG PO TABS
ORAL_TABLET | ORAL | Status: AC
  Administered 2018-08-06: 650 mg via ORAL
  Filled 2018-08-06: qty 2

## 2018-08-06 MED ORDER — METHYLPREDNISOLONE SODIUM SUCC 125 MG IJ SOLR
INTRAMUSCULAR | Status: AC
  Filled 2018-08-06: qty 2

## 2018-08-09 ENCOUNTER — Encounter (HOSPITAL_COMMUNITY): Payer: Medicare Other

## 2018-08-17 IMAGING — CT CT CHEST HIGH RESOLUTION W/O CM
2 of 5 series · 15 of 36 positions shown, 18 images · non-contrast
Comparison: 11/03/2010.

CLINICAL DATA: Chronic shortness of breath on oxygen therapy.
History of pulmonary fibrosis.

EXAM:
CT CHEST WITHOUT CONTRAST
TECHNIQUE: Multidetector CT imaging of the chest was performed following the
standard protocol without intravenous contrast. High resolution
imaging of the lungs, as well as inspiratory and expiratory imaging,
was performed.

[Series 3: high resolution · axial · 0.66mm/px · z∈[-296,-30]mm · 12 of 147 slices shown, 15 images]
[im 7/147  mediastinal]
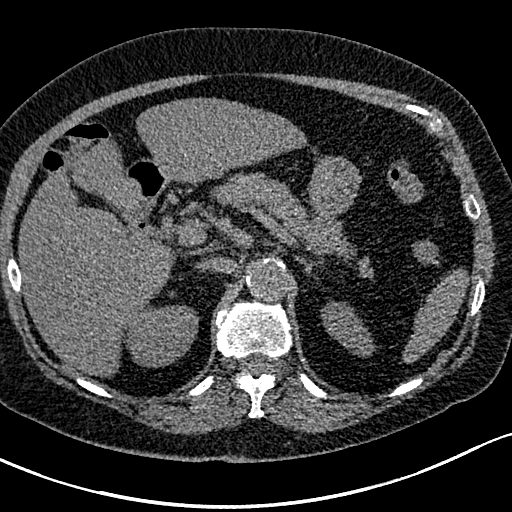
[im 7/147  lung]
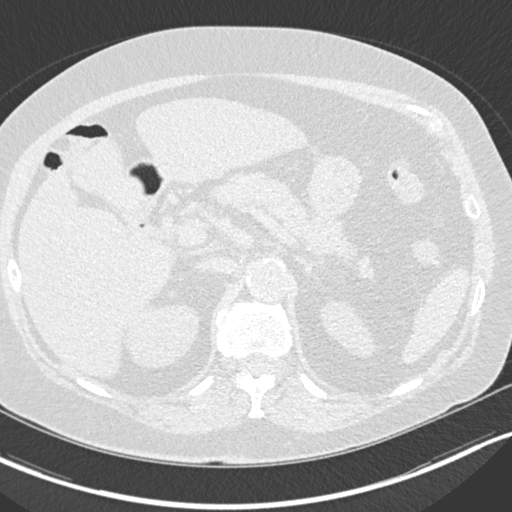
[im 20/147  lung]
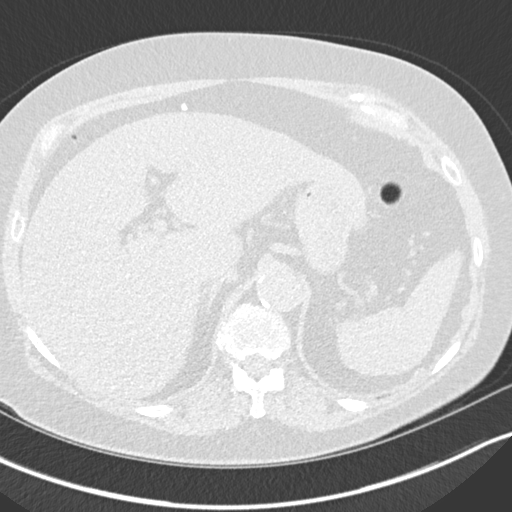
[im 34/147  lung]
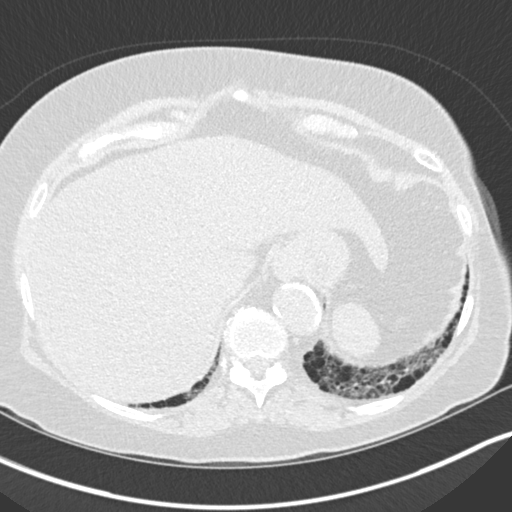
[im 47/147  lung]
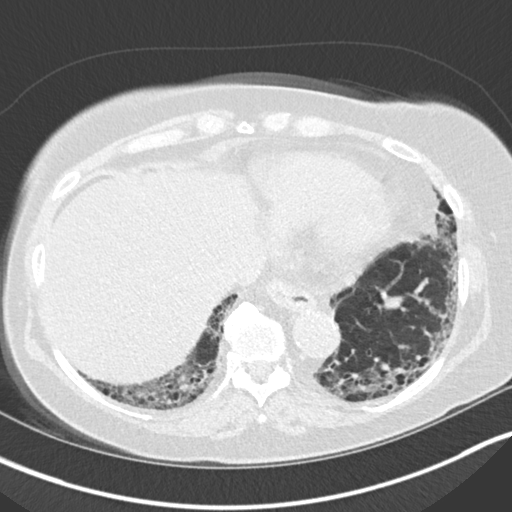
[im 54/147  mediastinal]
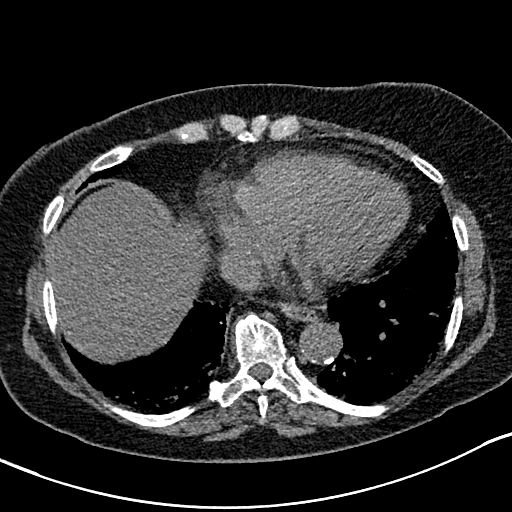
[im 54/147  lung]
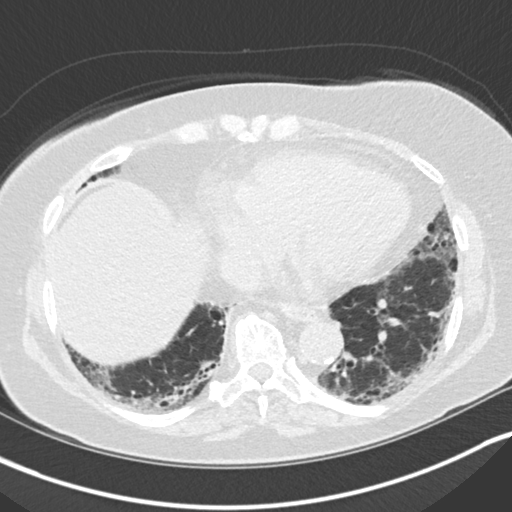
[im 67/147  lung]
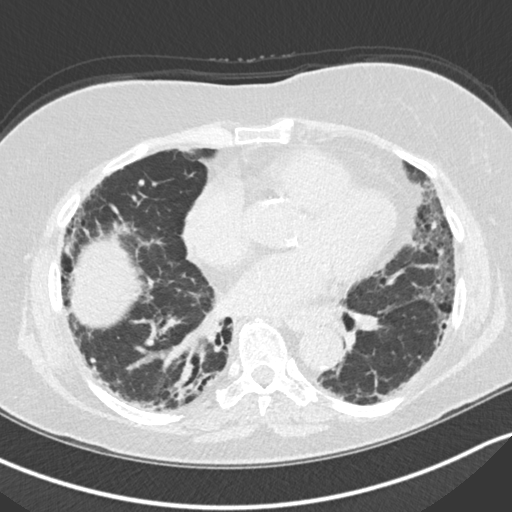
[im 80/147  lung]
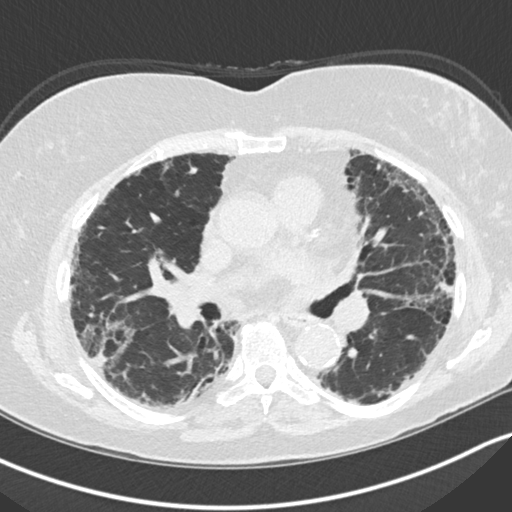
[im 93/147  lung]
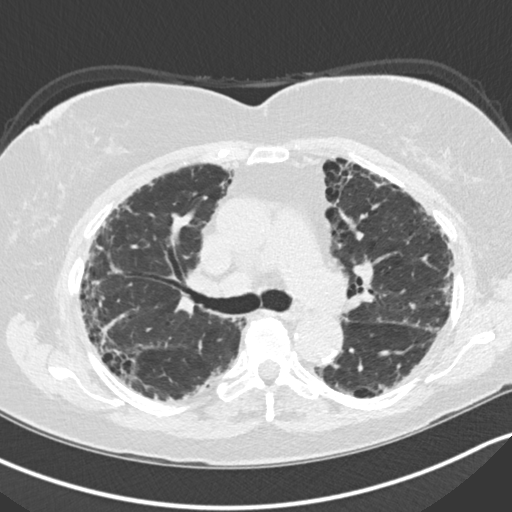
[im 100/147  mediastinal]
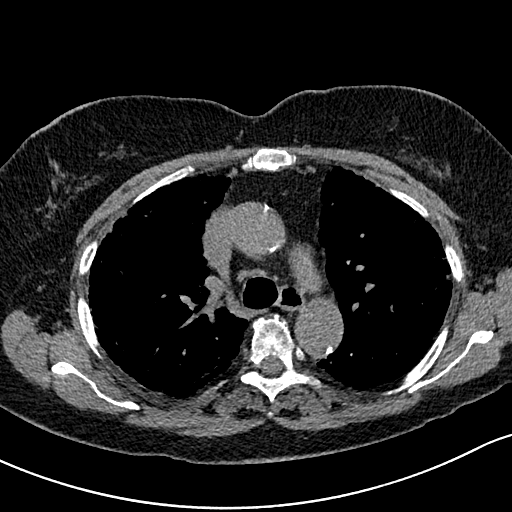
[im 100/147  lung]
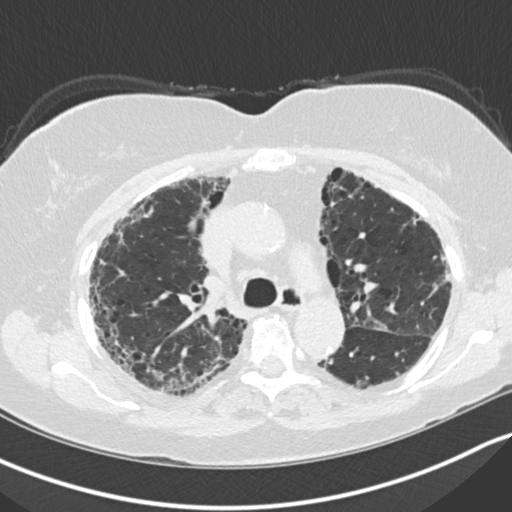
[im 113/147  lung]
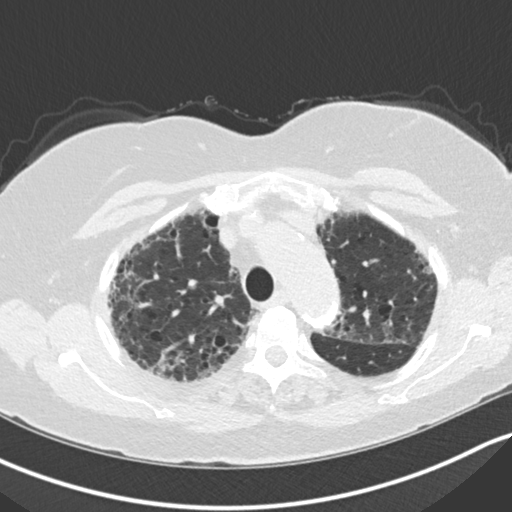
[im 127/147  lung]
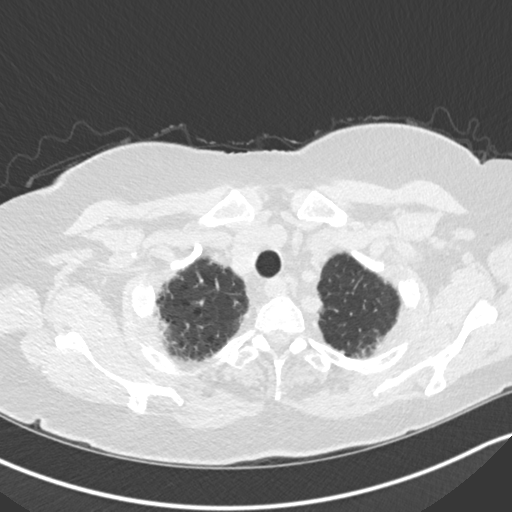
[im 140/147  lung]
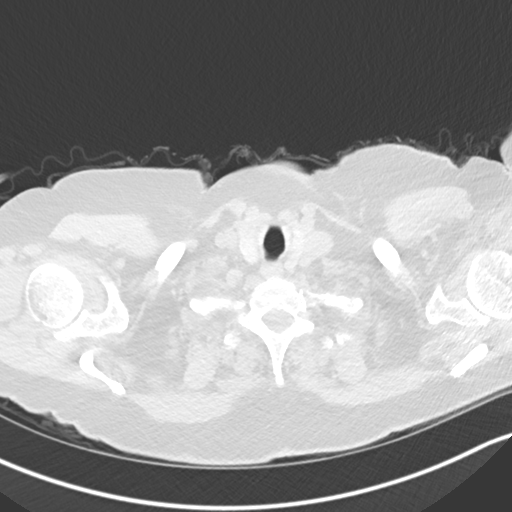

[Series 8: coronal · coronal · 0.57mm/px · 3 of 112 slices shown]
[im 23/112  lung]
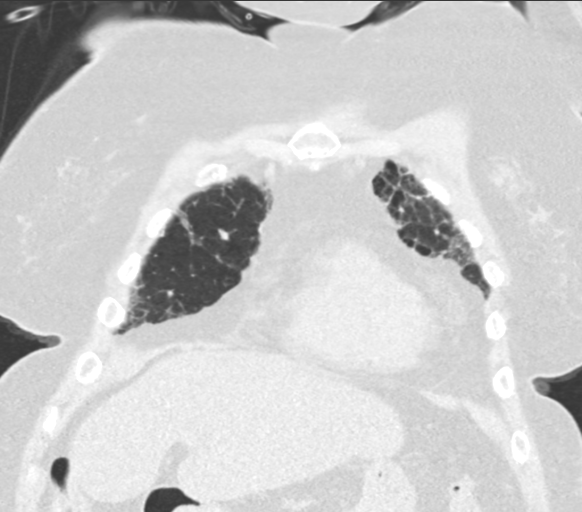
[im 45/112  lung]
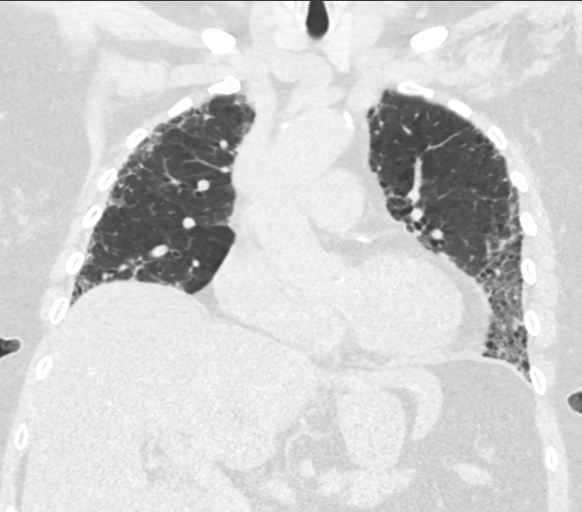
[im 67/112  lung]
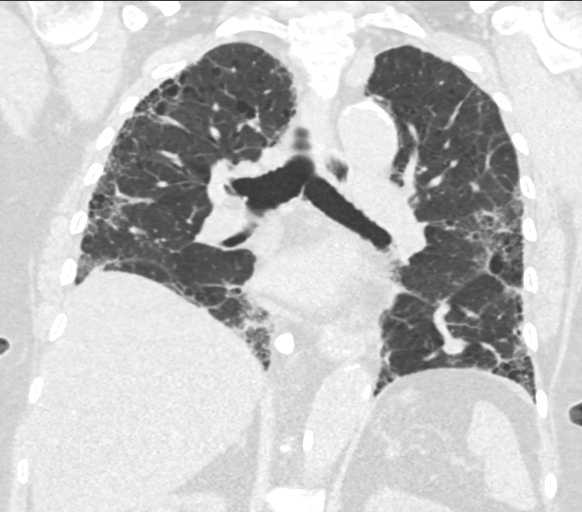

[15 of 36 positions shown; findings below may reference images not displayed]

FINDINGS: Cardiovascular: Atherosclerotic calcification of the arterial
vasculature, including coronary arteries. Heart is mildly enlarged.
No pericardial effusion.

Mediastinum/Nodes: No pathologically enlarged mediastinal or
axillary lymph nodes. Hilar regions are difficult to evaluate
without IV contrast. Esophagus is grossly unremarkable. Tiny hiatal
hernia.

Lungs/Pleura: Centrilobular emphysema. Peripheral and slightly
basilar predominant subpleural reticulation, ground-glass, traction
bronchiectasis/bronchiolectasis and honeycombing. Findings are
progressive from 11/03/2010. No pleural fluid. Airway is
unremarkable. No air trapping.

Upper Abdomen: Visualized portions of the liver, adrenal glands,
kidneys, spleen, pancreas, stomach and bowel are grossly
unremarkable with exception of a tiny hiatal hernia.
Cholecystectomy. No upper abdominal adenopathy.

Musculoskeletal: No worrisome lytic or sclerotic lesions.
Degenerative changes are seen spine.
IMPRESSION: 1. Pulmonary parenchymal pattern of fibrosis, as described above, is
progressive from 11/03/2010 and most indicative of usual
interstitial pneumonitis.
2. Aortic atherosclerosis (TDZWK-170.0). Coronary artery
calcification.
3.  Emphysema (TDZWK-EVU.I).

## 2018-09-13 DIAGNOSIS — M150 Primary generalized (osteo)arthritis: Secondary | ICD-10-CM | POA: Diagnosis not present

## 2018-09-13 DIAGNOSIS — M25512 Pain in left shoulder: Secondary | ICD-10-CM | POA: Diagnosis not present

## 2018-09-13 DIAGNOSIS — M0579 Rheumatoid arthritis with rheumatoid factor of multiple sites without organ or systems involvement: Secondary | ICD-10-CM | POA: Diagnosis not present

## 2018-09-13 DIAGNOSIS — J8410 Pulmonary fibrosis, unspecified: Secondary | ICD-10-CM | POA: Diagnosis not present

## 2018-11-06 DIAGNOSIS — I129 Hypertensive chronic kidney disease with stage 1 through stage 4 chronic kidney disease, or unspecified chronic kidney disease: Secondary | ICD-10-CM | POA: Diagnosis not present

## 2018-11-06 DIAGNOSIS — E559 Vitamin D deficiency, unspecified: Secondary | ICD-10-CM | POA: Diagnosis not present

## 2018-11-06 DIAGNOSIS — R7989 Other specified abnormal findings of blood chemistry: Secondary | ICD-10-CM | POA: Diagnosis not present

## 2018-11-06 DIAGNOSIS — E039 Hypothyroidism, unspecified: Secondary | ICD-10-CM | POA: Diagnosis not present

## 2018-11-06 DIAGNOSIS — E785 Hyperlipidemia, unspecified: Secondary | ICD-10-CM | POA: Diagnosis not present

## 2018-11-13 DIAGNOSIS — R7989 Other specified abnormal findings of blood chemistry: Secondary | ICD-10-CM | POA: Diagnosis not present

## 2018-11-13 DIAGNOSIS — J8410 Pulmonary fibrosis, unspecified: Secondary | ICD-10-CM | POA: Diagnosis not present

## 2018-11-13 DIAGNOSIS — I10 Essential (primary) hypertension: Secondary | ICD-10-CM | POA: Diagnosis not present

## 2018-11-13 DIAGNOSIS — Z0000 Encounter for general adult medical examination without abnormal findings: Secondary | ICD-10-CM | POA: Diagnosis not present

## 2018-11-13 DIAGNOSIS — E6609 Other obesity due to excess calories: Secondary | ICD-10-CM | POA: Diagnosis not present

## 2018-11-13 DIAGNOSIS — E785 Hyperlipidemia, unspecified: Secondary | ICD-10-CM | POA: Diagnosis not present

## 2018-11-13 DIAGNOSIS — M0579 Rheumatoid arthritis with rheumatoid factor of multiple sites without organ or systems involvement: Secondary | ICD-10-CM | POA: Diagnosis not present

## 2018-11-13 DIAGNOSIS — K219 Gastro-esophageal reflux disease without esophagitis: Secondary | ICD-10-CM | POA: Diagnosis not present

## 2018-11-13 DIAGNOSIS — Z6833 Body mass index (BMI) 33.0-33.9, adult: Secondary | ICD-10-CM | POA: Diagnosis not present

## 2018-11-13 DIAGNOSIS — Z9981 Dependence on supplemental oxygen: Secondary | ICD-10-CM | POA: Diagnosis not present

## 2018-12-27 ENCOUNTER — Ambulatory Visit: Payer: Medicare Other | Admitting: Pulmonary Disease

## 2019-01-08 DIAGNOSIS — E669 Obesity, unspecified: Secondary | ICD-10-CM | POA: Diagnosis not present

## 2019-01-08 DIAGNOSIS — M0579 Rheumatoid arthritis with rheumatoid factor of multiple sites without organ or systems involvement: Secondary | ICD-10-CM | POA: Diagnosis not present

## 2019-01-08 DIAGNOSIS — M150 Primary generalized (osteo)arthritis: Secondary | ICD-10-CM | POA: Diagnosis not present

## 2019-01-08 DIAGNOSIS — Z6835 Body mass index (BMI) 35.0-35.9, adult: Secondary | ICD-10-CM | POA: Diagnosis not present

## 2019-01-08 DIAGNOSIS — J8410 Pulmonary fibrosis, unspecified: Secondary | ICD-10-CM | POA: Diagnosis not present

## 2019-01-08 DIAGNOSIS — M25512 Pain in left shoulder: Secondary | ICD-10-CM | POA: Diagnosis not present

## 2019-02-05 ENCOUNTER — Other Ambulatory Visit (HOSPITAL_COMMUNITY): Payer: Self-pay | Admitting: *Deleted

## 2019-02-06 ENCOUNTER — Other Ambulatory Visit: Payer: Self-pay

## 2019-02-06 ENCOUNTER — Ambulatory Visit (HOSPITAL_COMMUNITY)
Admission: RE | Admit: 2019-02-06 | Discharge: 2019-02-06 | Disposition: A | Discharge status: Home / Self Care | Payer: Medicare Other | Source: Ambulatory Visit | Attending: Internal Medicine | Admitting: Internal Medicine

## 2019-02-06 DIAGNOSIS — M0579 Rheumatoid arthritis with rheumatoid factor of multiple sites without organ or systems involvement: Secondary | ICD-10-CM | POA: Diagnosis not present

## 2019-02-06 LAB — CBC WITH DIFFERENTIAL/PLATELET
Abs Immature Granulocytes: 0.11 10*3/uL — ABNORMAL HIGH (ref 0.00–0.07)
Basophils Absolute: 0.1 10*3/uL (ref 0.0–0.1)
Basophils Relative: 1 %
Eosinophils Absolute: 0.3 10*3/uL (ref 0.0–0.5)
Eosinophils Relative: 2 %
HCT: 44.5 % (ref 36.0–46.0)
Hemoglobin: 14.4 g/dL (ref 12.0–15.0)
Immature Granulocytes: 1 %
Lymphocytes Relative: 17 %
Lymphs Abs: 2.4 10*3/uL (ref 0.7–4.0)
MCH: 28.7 pg (ref 26.0–34.0)
MCHC: 32.4 g/dL (ref 30.0–36.0)
MCV: 88.8 fL (ref 80.0–100.0)
Monocytes Absolute: 1.4 10*3/uL — ABNORMAL HIGH (ref 0.1–1.0)
Monocytes Relative: 10 %
Neutro Abs: 9.8 10*3/uL — ABNORMAL HIGH (ref 1.7–7.7)
Neutrophils Relative %: 69 %
Platelets: 311 10*3/uL (ref 150–400)
RBC: 5.01 MIL/uL (ref 3.87–5.11)
RDW: 13.2 % (ref 11.5–15.5)
WBC: 14.1 10*3/uL — ABNORMAL HIGH (ref 4.0–10.5)
nRBC: 0 % (ref 0.0–0.2)

## 2019-02-06 LAB — COMPREHENSIVE METABOLIC PANEL
ALT: 13 U/L (ref 0–44)
AST: 23 U/L (ref 15–41)
Albumin: 3.4 g/dL — ABNORMAL LOW (ref 3.5–5.0)
Alkaline Phosphatase: 57 U/L (ref 38–126)
Anion gap: 11 (ref 5–15)
BUN: 15 mg/dL (ref 8–23)
CO2: 26 mmol/L (ref 22–32)
Calcium: 9.6 mg/dL (ref 8.9–10.3)
Chloride: 101 mmol/L (ref 98–111)
Creatinine, Ser: 0.92 mg/dL (ref 0.44–1.00)
GFR calc Af Amer: >60 mL/min (ref >60)
GFR calc non Af Amer: 57 mL/min — ABNORMAL LOW (ref >60)
Glucose, Bld: 97 mg/dL (ref 70–99)
Potassium: 3.5 mmol/L (ref 3.5–5.1)
Sodium: 138 mmol/L (ref 135–145)
Total Bilirubin: 0.5 mg/dL (ref 0.3–1.2)
Total Protein: 5.9 g/dL — ABNORMAL LOW (ref 6.5–8.1)

## 2019-02-06 MED ORDER — METHYLPREDNISOLONE SODIUM SUCC 125 MG IJ SOLR
INTRAMUSCULAR | Status: AC
  Administered 2019-02-06: 09:00:00 100 mg via INTRAVENOUS
  Filled 2019-02-06: qty 2

## 2019-02-06 MED ORDER — ACETAMINOPHEN 325 MG PO TABS
ORAL_TABLET | ORAL | Status: AC
  Administered 2019-02-06: 650 mg via ORAL
  Filled 2019-02-06: qty 2

## 2019-02-06 MED ORDER — METHYLPREDNISOLONE SODIUM SUCC 125 MG IJ SOLR
100.0000 mg | INTRAMUSCULAR | Status: AC
  Administered 2019-02-06: 09:00:00 100 mg via INTRAVENOUS

## 2019-02-06 MED ORDER — SODIUM CHLORIDE 0.9 % IV SOLN
1000.0000 mg | INTRAVENOUS | Status: DC
  Administered 2019-02-06: 1000 mg via INTRAVENOUS
  Filled 2019-02-06: qty 100

## 2019-02-06 MED ORDER — ACETAMINOPHEN 325 MG PO TABS
650.0000 mg | ORAL_TABLET | ORAL | Status: AC
  Administered 2019-02-06: 09:00:00 650 mg via ORAL

## 2019-02-20 ENCOUNTER — Other Ambulatory Visit: Payer: Self-pay

## 2019-02-20 ENCOUNTER — Ambulatory Visit (HOSPITAL_COMMUNITY)
Admission: RE | Admit: 2019-02-20 | Discharge: 2019-02-20 | Disposition: A | Discharge status: Home / Self Care | Payer: Medicare Other | Source: Ambulatory Visit | Attending: Internal Medicine | Admitting: Internal Medicine

## 2019-02-20 DIAGNOSIS — Z23 Encounter for immunization: Secondary | ICD-10-CM | POA: Diagnosis not present

## 2019-02-20 DIAGNOSIS — M0579 Rheumatoid arthritis with rheumatoid factor of multiple sites without organ or systems involvement: Secondary | ICD-10-CM | POA: Diagnosis not present

## 2019-02-20 MED ORDER — METHYLPREDNISOLONE SODIUM SUCC 125 MG IJ SOLR
INTRAMUSCULAR | Status: AC
  Administered 2019-02-20: 125 mg
  Filled 2019-02-20: qty 2

## 2019-02-20 MED ORDER — SODIUM CHLORIDE 0.9 % IV SOLN
1000.0000 mg | INTRAVENOUS | Status: AC
  Administered 2019-02-20: 1000 mg via INTRAVENOUS
  Filled 2019-02-20: qty 100

## 2019-02-20 MED ORDER — ACETAMINOPHEN 325 MG PO TABS
ORAL_TABLET | ORAL | Status: AC
  Administered 2019-02-20: 650 mg
  Filled 2019-02-20: qty 2

## 2019-02-20 MED ORDER — ACETAMINOPHEN 325 MG PO TABS: 650.0000 mg | ORAL_TABLET | ORAL | Status: DC

## 2019-02-20 MED ORDER — METHYLPREDNISOLONE SODIUM SUCC 125 MG IJ SOLR: 100.0000 mg | INTRAMUSCULAR | Status: DC

## 2019-03-01 ENCOUNTER — Ambulatory Visit (INDEPENDENT_AMBULATORY_CARE_PROVIDER_SITE_OTHER): Payer: Medicare Other | Admitting: Adult Health

## 2019-03-01 ENCOUNTER — Other Ambulatory Visit: Payer: Self-pay

## 2019-03-01 ENCOUNTER — Encounter: Payer: Self-pay | Admitting: Adult Health

## 2019-03-01 DIAGNOSIS — J8410 Pulmonary fibrosis, unspecified: Secondary | ICD-10-CM | POA: Diagnosis not present

## 2019-03-01 DIAGNOSIS — J9611 Chronic respiratory failure with hypoxia: Secondary | ICD-10-CM

## 2019-03-01 DIAGNOSIS — J439 Emphysema, unspecified: Secondary | ICD-10-CM | POA: Diagnosis not present

## 2019-03-01 DIAGNOSIS — M069 Rheumatoid arthritis, unspecified: Secondary | ICD-10-CM | POA: Diagnosis not present

## 2019-03-01 NOTE — Progress Notes (Signed)
@Patient  ID: [name], female    DOB: December 11, 1931, 85 y.o.   MRN: [id number]  Chief Complaint  Patient presents with  . Follow-up    Pulmonary Fibrosis     Referring provider: No ref. provider found  HPI: 85 year old female former smoker (quit 1998) followed for pulmonary fibrosis Medical history significant for rheumatoid arthritis Has a strong family history of fibrosis with siblings with pulmonary fibrosis (2 siblings passed with pulmonary fibrosis )  Diagnosed with rheumatoid arthritis in 2008 has been on Humira, Remicade currently on Rituxan  and prednisone 10 mg daily    TEST  Ct abd 8/10 sub pleural fibrosis  CXR 5/11bibasal scarring  PFTs 7/12 showed preserved lung volumes with FVC 77% & TLC 80%, DLCO reduced at 49%.  She desatn to 86% on second lap.  HRCT 6/12 showed diffuse chronic interstitial coarsening with scattered areas of bronchiectasis bilaterally & subpleural honeycombing consistent with UIP  ONO- 48 min satn < 88% >O2 at At bedtime (02/2011 )  - started O2 in oct'12  Trial of Lasix >> not much benefit.  Not interested in rehab  Wonders if her fibrosis is familial but not interested in research study , her sister passed away 10/30/2011  2011/11/30 FVC 82%, does not want to perform full PFTs - don't like that clip over my nose  FVC 84% -preserved -   05/2016 HRCT Chest  Pulmonary fibrosis - progressive since 2012 .   03/01/2019 Follow up : Pulmonary Fibrosis  Patient presents for a 25-month follow-up.  Patient has known pulmonary fibrosis and pulmonary emphysema.  He also has a history of rheumatoid arthritis on Rituxan and prednisone .  Patient is on oxygen 3 L.  Since last visit says that she is doing the same. Gets winded with minimal activity . Feels she is doing about the same but definitely activity tolerance and endurance is slowly getting less over last couple of years. Able to do light housework , spaced out and frequent rest breaks.  Has friend/family  that helps her.  She remains on Oxygen 3l/m . No increased oxygen demands.   Allergies  Allergen Reactions  . Dilaudid [Hydromorphone Hcl] Nausea And Vomiting  . Penicillins Itching and Rash  . Statins Itching    Immunization History  Administered Date(s) Administered  . Influenza Split 02/14/2011, 02/20/2012, 02/13/2013  . Influenza, High Dose Seasonal PF 02/08/2016, 02/13/2017, 02/22/2019  . Influenza-Unspecified 02/13/2014, 03/08/2015  . Pneumococcal Conjugate-13 03/25/2014    Past Medical History:  Diagnosis Date  . Colon, diverticulosis Nov 2007  . Hyperlipemia   . Hypertension   . IBS (irritable bowel syndrome)   . Irregular heart rate   . Personal history of colonic polyps 04/10/2006   hyperplastic  . Pulmonary fibrosis ([place])   . Rheumatoid arthritis(714.0)     Tobacco History: Social History   Tobacco Use  Smoking Status Former Smoker  . Packs/day: 1.00  . Years: 30.00  . Pack years: 30.00  . Types: Cigarettes  . Quit date: 05/16/1988  . Years since quitting: 30.8  Smokeless Tobacco Never Used   Counseling given: Not Answered   Outpatient Medications Prior to Visit  Medication Sig Dispense Refill  . amLODipine (NORVASC) 5 MG tablet Take 2.5 mg by mouth daily.     . cholecalciferol (VITAMIN D) 1000 units tablet Take 1,000 Units by mouth daily.    Marland Kitchen DEXILANT 60 MG capsule Take 1 capsule by mouth as needed (FOR HEARTBURN).     . folic  acid (FOLVITE) 1 MG tablet Take 1 mg by mouth daily.      . furosemide (LASIX) 20 MG tablet Take 1 tablet by mouth Once daily as needed. For swelling    . irbesartan (AVAPRO) 300 MG tablet Take 1 tablet by mouth daily.    Marland Kitchen loratadine (CLARITIN) 10 MG tablet Take 10 mg by mouth daily.    Vladimir Faster Glycol-Propyl Glycol (SYSTANE) 0.4-0.3 % SOLN Apply 1 drop to eye daily. For dry eyes    . predniSONE (DELTASONE) 10 MG tablet Take 1 tablet by mouth daily.  5  . RiTUXimab (RITUXAN IV) Inject 2 application into the vein once.  TAKES 2 DOSES IN ONE MONTH TIME, THEN DOESN'T TAKEN AGAIN FOR 6 MONTHS     No facility-administered medications prior to visit.      Review of Systems:   Constitutional:   No  weight loss, night sweats,  Fevers, chills, + fatigue, or  lassitude.  HEENT:   No headaches,  Difficulty swallowing,  Tooth/dental problems, or  Sore throat,                No sneezing, itching, ear ache, nasal congestion, post nasal drip,   CV:  No chest pain,  Orthopnea, PND, +swelling in lower extremities, anasarca, dizziness, palpitations, syncope.   GI  No heartburn, indigestion, abdominal pain, nausea, vomiting, diarrhea, change in bowel habits, loss of appetite, bloody stools.   Resp:  .  No chest wall deformity  Skin: no rash or lesions.  GU: no dysuria, change in color of urine, no urgency or frequency.  No flank pain, no hematuria   MS:  No joint pain or swelling.  No decreased range of motion.  No back pain.    Physical Exam  BP 128/70 (BP Location: Left Arm, Cuff Size: Normal)   Pulse (!) 101   Temp 98.1 F (36.7 C) (Temporal)   Ht 5\' 3"  (1.6 m)   Wt 189 lb 9.6 oz (86 kg)   SpO2 96%   BMI 33.59 kg/m   GEN: A/Ox3; pleasant , NAD, Obese per BMI    HEENT:  [place]/AT,   NOSE-clear, THROAT-clear, no lesions, no postnasal drip or exudate noted.  Class 2 MP airway   NECK:  Supple w/ fair ROM; no JVD; normal carotid impulses w/o bruits; no thyromegaly or nodules palpated; no lymphadenopathy.    RESP  Clear  P & A; w/o, wheezes/ rales/ or rhonchi. no accessory muscle use, no dullness to percussion  CARD:  RRR, no m/r/g, tr-1+ peripheral edema, pulses intact, no cyanosis or clubbing.  GI:   Soft & nt; nml bowel sounds; no organomegaly or masses detected.   Musco: Warm bil, no deformities or joint swelling noted.   Neuro: alert, no focal deficits noted.    Skin: Warm, no lesions or rashes    Lab Results:  CBC    BNP No results found for: BNP  ProBNP No results found for:  PROBNP  Imaging: No results found.    No flowsheet data found.  No results found for: NITRICOXIDE      Assessment & Plan:   Pulmonary emphysema with fibrosis of lung (HCC) Clinically stable , does seem to have gradual progression over last few years with decreased exercise and activity tolerance.  Encouraged on activity as tolerated , high protein diet    Plan  Patient Instructions  Continue on Current Regimen.  Activity as tolerated.  Continue on Oxygen 3l/m .   Follow  up Dr. Elsworth Soho in 6 months and As needed       Chronic respiratory failure ([place]) Continue on Oxygen , goal >88%   Plan  Patient Instructions  Continue on Current Regimen.  Activity as tolerated.  Continue on Oxygen 3l/m .   Follow up Dr. Elsworth Soho in 6 months and As needed       Rheumatoid arthritis [facility]) Continue follow up with Rheumatology      Rexene Edison, NP 03/01/2019

## 2019-03-01 NOTE — Assessment & Plan Note (Signed)
Continue on Oxygen , goal >88%   Plan  Patient Instructions  Continue on Current Regimen.  Activity as tolerated.  Continue on Oxygen 3l/m .   Follow up Dr. Elsworth Soho in 6 months and As needed

## 2019-03-01 NOTE — Assessment & Plan Note (Signed)
Clinically stable , does seem to have gradual progression over last few years with decreased exercise and activity tolerance.  Encouraged on activity as tolerated , high protein diet    Plan  Patient Instructions  Continue on Current Regimen.  Activity as tolerated.  Continue on Oxygen 3l/m .   Follow up Dr. Elsworth Soho in 6 months and As needed

## 2019-03-01 NOTE — Assessment & Plan Note (Signed)
Continue follow-up with Rheumatology.

## 2019-03-01 NOTE — Patient Instructions (Addendum)
Continue on Current Regimen.  Activity as tolerated.  Continue on Oxygen 3l/m .   Follow up Dr. Elsworth Soho in 6 months and As needed

## 2019-04-16 DIAGNOSIS — M150 Primary generalized (osteo)arthritis: Secondary | ICD-10-CM | POA: Diagnosis not present

## 2019-04-16 DIAGNOSIS — Z6834 Body mass index (BMI) 34.0-34.9, adult: Secondary | ICD-10-CM | POA: Diagnosis not present

## 2019-04-16 DIAGNOSIS — M0579 Rheumatoid arthritis with rheumatoid factor of multiple sites without organ or systems involvement: Secondary | ICD-10-CM | POA: Diagnosis not present

## 2019-04-16 DIAGNOSIS — J8410 Pulmonary fibrosis, unspecified: Secondary | ICD-10-CM | POA: Diagnosis not present

## 2019-04-16 DIAGNOSIS — E669 Obesity, unspecified: Secondary | ICD-10-CM | POA: Diagnosis not present

## 2019-04-16 DIAGNOSIS — Z7952 Long term (current) use of systemic steroids: Secondary | ICD-10-CM | POA: Diagnosis not present

## 2019-04-16 DIAGNOSIS — M25512 Pain in left shoulder: Secondary | ICD-10-CM | POA: Diagnosis not present

## 2019-07-17 DIAGNOSIS — M150 Primary generalized (osteo)arthritis: Secondary | ICD-10-CM | POA: Diagnosis not present

## 2019-07-17 DIAGNOSIS — M25512 Pain in left shoulder: Secondary | ICD-10-CM | POA: Diagnosis not present

## 2019-07-17 DIAGNOSIS — Z6834 Body mass index (BMI) 34.0-34.9, adult: Secondary | ICD-10-CM | POA: Diagnosis not present

## 2019-07-17 DIAGNOSIS — M0579 Rheumatoid arthritis with rheumatoid factor of multiple sites without organ or systems involvement: Secondary | ICD-10-CM | POA: Diagnosis not present

## 2019-07-17 DIAGNOSIS — J8410 Pulmonary fibrosis, unspecified: Secondary | ICD-10-CM | POA: Diagnosis not present

## 2019-07-17 DIAGNOSIS — Z7952 Long term (current) use of systemic steroids: Secondary | ICD-10-CM | POA: Diagnosis not present

## 2019-08-05 ENCOUNTER — Other Ambulatory Visit: Payer: Self-pay

## 2019-08-05 ENCOUNTER — Encounter: Payer: Self-pay | Admitting: Pulmonary Disease

## 2019-08-05 ENCOUNTER — Ambulatory Visit (INDEPENDENT_AMBULATORY_CARE_PROVIDER_SITE_OTHER): Payer: Medicare Other | Admitting: Pulmonary Disease

## 2019-08-05 DIAGNOSIS — M05731 Rheumatoid arthritis with rheumatoid factor of right wrist without organ or systems involvement: Secondary | ICD-10-CM

## 2019-08-05 DIAGNOSIS — J8410 Pulmonary fibrosis, unspecified: Secondary | ICD-10-CM

## 2019-08-05 DIAGNOSIS — J439 Emphysema, unspecified: Secondary | ICD-10-CM

## 2019-08-05 DIAGNOSIS — J9611 Chronic respiratory failure with hypoxia: Secondary | ICD-10-CM | POA: Diagnosis not present

## 2019-08-05 DIAGNOSIS — M05732 Rheumatoid arthritis with rheumatoid factor of left wrist without organ or systems involvement: Secondary | ICD-10-CM

## 2019-08-05 MED ORDER — PREDNISONE 10 MG PO TABS: 10.0000 mg | ORAL_TABLET | Freq: Every day | ORAL | 5 refills | Status: DC

## 2019-08-05 NOTE — Assessment & Plan Note (Signed)
Clinically, ILD has behaved more like RA/UIP rather than IPF -with  progression over 11 years She does not desire antifibrotic therapies which we have discussed in the past

## 2019-08-05 NOTE — Progress Notes (Signed)
   Subjective:    Patient ID: Veronica Stone, female    DOB: 1931/06/06, 86 y.o.   MRN: [id number]  HPI  86 yo remote smoker, quit 1990 with rheumatoid arthritis and pulmonary fibrosis/UIP pattern & chronic resp failure on O2 She is on Rituxan and 10 mg of prednisone.  Chief Complaint  Patient presents with  . Follow-up    Patient has shortness of breath with exertion and feels like it is getting worse over last few months. Patient has bad arthritis in her hands. Patient is on 3 liters oxygen.    She complains of severe crippling arthritis.  She feels she has gone downhill ever since her new PCP has taken her off Premarin and Vicodin-which she was on for many years.  Breathing may be worse, she now uses a wheelchair to get into the office-but she reports that this may be due to arthritis.  She complains of pain in her wrists and her hands.  Compliant with 10 mg of prednisone, is due for Rituxan next month  Has not taken her Covid shot yet Accompanied by her daughter who provides more history  Significant tests/ events reviewed  Ct abd 12/2008 sub pleural fibrosis  HRCT 10/2010 showed diffuse chronic interstitial coarsening with scattered areas of bronchiectasis bilaterally &subpleural honeycombing consistent with UIP HRCT 05/2016-worsening fibrosis compared to 2012   PFTs 11/2010 showed preserved lung volumes with FVC 77% &TLC 80%, DLCO reduced at 49%.  She desatn to 86% on second lap.  02/2011 ONO- 48 min satn <88% >O2 at At bedtime - started O2  10/2011 FVC 82%, does not want to perform full PFTs - don't like that clip over my nose  FVC 84% -preserved   Review of Systems Patient denies significant dyspnea,cough, hemoptysis,  chest pain, palpitations, pedal edema, orthopnea, paroxysmal nocturnal dyspnea, lightheadedness, nausea, vomiting, abdominal or  leg pains      Objective:   Physical Exam  Gen. Pleasant, well-nourished, in no distress ENT - no thrush, no  pallor/icterus,no post nasal drip Neck: No JVD, no thyromegaly, no carotid bruits Lungs: no use of accessory muscles, no dullness to percussion, bibasal rales no rhonchi  Cardiovascular: Rhythm regular, heart sounds  normal, no murmurs or gallops, no peripheral edema Musculoskeletal: No deformities, no cyanosis or clubbing         Assessment & Plan:

## 2019-08-05 NOTE — Assessment & Plan Note (Signed)
Please make an appointment to get your Covid shot Get a pulse oximeter-goal would be to keep saturation 90% and above, if needed can increase oxygen to 4 L

## 2019-08-05 NOTE — Assessment & Plan Note (Signed)
Refill on prednisone -take 2 tablets / 20 mg daily for 7 days then back down to 10 mg daily

## 2019-08-05 NOTE — Patient Instructions (Signed)
  Refill on prednisone -take 2 tablets / 20 mg daily for 7 days then back down to 10 mg daily Please make an appointment to get your Covid shot Get a pulse oximeter-goal would be to keep saturation 90% and above, if needed can increase oxygen to 4 L

## 2019-08-13 DIAGNOSIS — H35372 Puckering of macula, left eye: Secondary | ICD-10-CM | POA: Diagnosis not present

## 2019-08-19 ENCOUNTER — Telehealth: Payer: Self-pay | Admitting: Adult Health

## 2019-08-19 NOTE — Telephone Encounter (Signed)
Instructions    Return in about 4 months (around 12/05/2019) for TP.  Refill on prednisone -take 2 tablets / 20 mg daily for 7 days then back down to 10 mg daily Please make an appointment to get your Covid shot Get a pulse oximeter-goal would be to keep saturation 90% and above, if needed can increase oxygen to 4 L       Spoke with pt, states that she still feels short of breath with exertion, wants to increase O2 to 4lpm when exerting.  I relayed RA's recs from 08/05/2019 office visit to her.  Pt expressed understanding.    Nothing further needed at this time- will route to RA as FYI.

## 2019-08-20 ENCOUNTER — Other Ambulatory Visit (HOSPITAL_COMMUNITY): Payer: Self-pay

## 2019-08-21 ENCOUNTER — Ambulatory Visit (HOSPITAL_COMMUNITY)
Admission: RE | Admit: 2019-08-21 | Discharge: 2019-08-21 | Disposition: A | Discharge status: Home / Self Care | Payer: Medicare Other | Source: Ambulatory Visit | Attending: Internal Medicine | Admitting: Internal Medicine

## 2019-08-21 ENCOUNTER — Other Ambulatory Visit: Payer: Self-pay

## 2019-08-21 DIAGNOSIS — M069 Rheumatoid arthritis, unspecified: Secondary | ICD-10-CM | POA: Diagnosis not present

## 2019-08-21 LAB — CBC WITH DIFFERENTIAL/PLATELET
Abs Immature Granulocytes: 0.14 10*3/uL — ABNORMAL HIGH (ref 0.00–0.07)
Basophils Absolute: 0.1 10*3/uL (ref 0.0–0.1)
Basophils Relative: 1 %
Eosinophils Absolute: 0.3 10*3/uL (ref 0.0–0.5)
Eosinophils Relative: 3 %
HCT: 46.2 % — ABNORMAL HIGH (ref 36.0–46.0)
Hemoglobin: 14.7 g/dL (ref 12.0–15.0)
Immature Granulocytes: 1 %
Lymphocytes Relative: 13 %
Lymphs Abs: 1.6 10*3/uL (ref 0.7–4.0)
MCH: 28.3 pg (ref 26.0–34.0)
MCHC: 31.8 g/dL (ref 30.0–36.0)
MCV: 88.8 fL (ref 80.0–100.0)
Monocytes Absolute: 1.4 10*3/uL — ABNORMAL HIGH (ref 0.1–1.0)
Monocytes Relative: 11 %
Neutro Abs: 9.3 10*3/uL — ABNORMAL HIGH (ref 1.7–7.7)
Neutrophils Relative %: 71 %
Platelets: 325 10*3/uL (ref 150–400)
RBC: 5.2 MIL/uL — ABNORMAL HIGH (ref 3.87–5.11)
RDW: 12.8 % (ref 11.5–15.5)
WBC: 12.8 10*3/uL — ABNORMAL HIGH (ref 4.0–10.5)
nRBC: 0 % (ref 0.0–0.2)

## 2019-08-21 LAB — COMPREHENSIVE METABOLIC PANEL
ALT: 12 U/L (ref 0–44)
AST: 18 U/L (ref 15–41)
Albumin: 3.3 g/dL — ABNORMAL LOW (ref 3.5–5.0)
Alkaline Phosphatase: 61 U/L (ref 38–126)
Anion gap: 13 (ref 5–15)
BUN: 16 mg/dL (ref 8–23)
CO2: 25 mmol/L (ref 22–32)
Calcium: 9.5 mg/dL (ref 8.9–10.3)
Chloride: 98 mmol/L (ref 98–111)
Creatinine, Ser: 1.01 mg/dL — ABNORMAL HIGH (ref 0.44–1.00)
GFR calc Af Amer: 59 mL/min — ABNORMAL LOW (ref >60)
GFR calc non Af Amer: 51 mL/min — ABNORMAL LOW (ref >60)
Glucose, Bld: 110 mg/dL — ABNORMAL HIGH (ref 70–99)
Potassium: 3.8 mmol/L (ref 3.5–5.1)
Sodium: 136 mmol/L (ref 135–145)
Total Bilirubin: 0.7 mg/dL (ref 0.3–1.2)
Total Protein: 5.7 g/dL — ABNORMAL LOW (ref 6.5–8.1)

## 2019-08-21 MED ORDER — SODIUM CHLORIDE 0.9 % IV SOLN
1000.0000 mg | Freq: Once | INTRAVENOUS | Status: AC
  Administered 2019-08-21: 1000 mg via INTRAVENOUS
  Filled 2019-08-21: qty 100

## 2019-08-21 MED ORDER — ACETAMINOPHEN 325 MG PO TABS: 650.0000 mg | ORAL_TABLET | Freq: Once | ORAL | Status: DC

## 2019-08-21 MED ORDER — ACETAMINOPHEN 325 MG PO TABS
ORAL_TABLET | ORAL | Status: AC
  Administered 2019-08-21: 09:00:00 650 mg
  Filled 2019-08-21: qty 2

## 2019-08-21 MED ORDER — METHYLPREDNISOLONE SODIUM SUCC 125 MG IJ SOLR: 100.0000 mg | Freq: Once | INTRAMUSCULAR | Status: DC

## 2019-08-21 MED ORDER — METHYLPREDNISOLONE SODIUM SUCC 125 MG IJ SOLR
INTRAMUSCULAR | Status: AC
  Administered 2019-08-21: 125 mg
  Filled 2019-08-21: qty 2

## 2019-08-29 DIAGNOSIS — H18413 Arcus senilis, bilateral: Secondary | ICD-10-CM | POA: Diagnosis not present

## 2019-08-29 DIAGNOSIS — H11153 Pinguecula, bilateral: Secondary | ICD-10-CM | POA: Diagnosis not present

## 2019-08-29 DIAGNOSIS — H35342 Macular cyst, hole, or pseudohole, left eye: Secondary | ICD-10-CM | POA: Diagnosis not present

## 2019-08-29 DIAGNOSIS — H35372 Puckering of macula, left eye: Secondary | ICD-10-CM | POA: Diagnosis not present

## 2019-08-29 DIAGNOSIS — H40013 Open angle with borderline findings, low risk, bilateral: Secondary | ICD-10-CM | POA: Diagnosis not present

## 2019-08-29 DIAGNOSIS — H04123 Dry eye syndrome of bilateral lacrimal glands: Secondary | ICD-10-CM | POA: Diagnosis not present

## 2019-08-29 DIAGNOSIS — Z961 Presence of intraocular lens: Secondary | ICD-10-CM | POA: Diagnosis not present

## 2019-09-04 ENCOUNTER — Other Ambulatory Visit: Payer: Self-pay

## 2019-09-04 ENCOUNTER — Ambulatory Visit (HOSPITAL_COMMUNITY)
Admission: RE | Admit: 2019-09-04 | Discharge: 2019-09-04 | Disposition: A | Discharge status: Home / Self Care | Payer: Medicare Other | Source: Ambulatory Visit | Attending: Internal Medicine | Admitting: Internal Medicine

## 2019-09-04 DIAGNOSIS — M0579 Rheumatoid arthritis with rheumatoid factor of multiple sites without organ or systems involvement: Secondary | ICD-10-CM | POA: Insufficient documentation

## 2019-09-04 LAB — CBC WITH DIFFERENTIAL/PLATELET
Abs Immature Granulocytes: 0.18 10*3/uL — ABNORMAL HIGH (ref 0.00–0.07)
Basophils Absolute: 0.1 10*3/uL (ref 0.0–0.1)
Basophils Relative: 1 %
Eosinophils Absolute: 0.2 10*3/uL (ref 0.0–0.5)
Eosinophils Relative: 2 %
HCT: 47.9 % — ABNORMAL HIGH (ref 36.0–46.0)
Hemoglobin: 15.3 g/dL — ABNORMAL HIGH (ref 12.0–15.0)
Immature Granulocytes: 1 %
Lymphocytes Relative: 14 %
Lymphs Abs: 2 10*3/uL (ref 0.7–4.0)
MCH: 28.3 pg (ref 26.0–34.0)
MCHC: 31.9 g/dL (ref 30.0–36.0)
MCV: 88.7 fL (ref 80.0–100.0)
Monocytes Absolute: 1.1 10*3/uL — ABNORMAL HIGH (ref 0.1–1.0)
Monocytes Relative: 8 %
Neutro Abs: 11.2 10*3/uL — ABNORMAL HIGH (ref 1.7–7.7)
Neutrophils Relative %: 74 %
Platelets: 363 10*3/uL (ref 150–400)
RBC: 5.4 MIL/uL — ABNORMAL HIGH (ref 3.87–5.11)
RDW: 13 % (ref 11.5–15.5)
WBC: 14.9 10*3/uL — ABNORMAL HIGH (ref 4.0–10.5)
nRBC: 0 % (ref 0.0–0.2)

## 2019-09-04 LAB — COMPREHENSIVE METABOLIC PANEL
ALT: 14 U/L (ref 0–44)
AST: 23 U/L (ref 15–41)
Albumin: 3.7 g/dL (ref 3.5–5.0)
Alkaline Phosphatase: 64 U/L (ref 38–126)
Anion gap: 14 (ref 5–15)
BUN: 16 mg/dL (ref 8–23)
CO2: 24 mmol/L (ref 22–32)
Calcium: 9.6 mg/dL (ref 8.9–10.3)
Chloride: 98 mmol/L (ref 98–111)
Creatinine, Ser: 0.94 mg/dL (ref 0.44–1.00)
GFR calc Af Amer: >60 mL/min (ref >60)
GFR calc non Af Amer: 55 mL/min — ABNORMAL LOW (ref >60)
Glucose, Bld: 111 mg/dL — ABNORMAL HIGH (ref 70–99)
Potassium: 3.9 mmol/L (ref 3.5–5.1)
Sodium: 136 mmol/L (ref 135–145)
Total Bilirubin: 0.8 mg/dL (ref 0.3–1.2)
Total Protein: 6.2 g/dL — ABNORMAL LOW (ref 6.5–8.1)

## 2019-09-04 MED ORDER — METHYLPREDNISOLONE SODIUM SUCC 125 MG IJ SOLR: 100.0000 mg | Freq: Once | INTRAMUSCULAR | Status: DC

## 2019-09-04 MED ORDER — METHYLPREDNISOLONE SODIUM SUCC 125 MG IJ SOLR
INTRAMUSCULAR | Status: AC
  Administered 2019-09-04: 100 mg
  Filled 2019-09-04: qty 2

## 2019-09-04 MED ORDER — ACETAMINOPHEN 325 MG PO TABS: 650.0000 mg | ORAL_TABLET | Freq: Once | ORAL | Status: DC

## 2019-09-04 MED ORDER — SODIUM CHLORIDE 0.9 % IV SOLN
1000.0000 mg | Freq: Once | INTRAVENOUS | Status: AC
  Administered 2019-09-04: 1000 mg via INTRAVENOUS
  Filled 2019-09-04: qty 100

## 2019-09-04 MED ORDER — ACETAMINOPHEN 325 MG PO TABS
ORAL_TABLET | ORAL | Status: AC
  Administered 2019-09-04: 650 mg
  Filled 2019-09-04: qty 2

## 2019-11-19 ENCOUNTER — Encounter: Payer: Self-pay | Admitting: Adult Health

## 2019-11-19 DIAGNOSIS — I10 Essential (primary) hypertension: Secondary | ICD-10-CM | POA: Diagnosis not present

## 2019-11-19 DIAGNOSIS — Z6833 Body mass index (BMI) 33.0-33.9, adult: Secondary | ICD-10-CM | POA: Diagnosis not present

## 2019-11-19 DIAGNOSIS — E785 Hyperlipidemia, unspecified: Secondary | ICD-10-CM | POA: Diagnosis not present

## 2019-11-19 DIAGNOSIS — Z9981 Dependence on supplemental oxygen: Secondary | ICD-10-CM | POA: Diagnosis not present

## 2019-11-19 DIAGNOSIS — J8410 Pulmonary fibrosis, unspecified: Secondary | ICD-10-CM | POA: Diagnosis not present

## 2019-11-19 DIAGNOSIS — K219 Gastro-esophageal reflux disease without esophagitis: Secondary | ICD-10-CM | POA: Diagnosis not present

## 2019-11-19 DIAGNOSIS — M0579 Rheumatoid arthritis with rheumatoid factor of multiple sites without organ or systems involvement: Secondary | ICD-10-CM | POA: Diagnosis not present

## 2019-11-19 DIAGNOSIS — M810 Age-related osteoporosis without current pathological fracture: Secondary | ICD-10-CM | POA: Diagnosis not present

## 2019-11-19 DIAGNOSIS — Z0000 Encounter for general adult medical examination without abnormal findings: Secondary | ICD-10-CM | POA: Diagnosis not present

## 2019-12-05 ENCOUNTER — Ambulatory Visit: Payer: Medicare Other | Admitting: Adult Health

## 2019-12-10 ENCOUNTER — Ambulatory Visit (INDEPENDENT_AMBULATORY_CARE_PROVIDER_SITE_OTHER): Payer: Medicare Other | Admitting: Adult Health

## 2019-12-10 ENCOUNTER — Other Ambulatory Visit: Payer: Self-pay

## 2019-12-10 ENCOUNTER — Encounter: Payer: Self-pay | Admitting: Adult Health

## 2019-12-10 DIAGNOSIS — J9611 Chronic respiratory failure with hypoxia: Secondary | ICD-10-CM

## 2019-12-10 DIAGNOSIS — J8410 Pulmonary fibrosis, unspecified: Secondary | ICD-10-CM

## 2019-12-10 DIAGNOSIS — J439 Emphysema, unspecified: Secondary | ICD-10-CM

## 2019-12-10 NOTE — Assessment & Plan Note (Signed)
Continue on current level of oxygen.  No recent increase oxygen demands.

## 2019-12-10 NOTE — Progress Notes (Signed)
@Patient  ID: Veronica Stone, female    DOB: August 30, 1931, 86 y.o.   MRN: [id number]  Chief Complaint  Patient presents with  . Follow-up    PF     Referring provider: No ref. provider found  HPI: 86 year old female former smoker quit 1998 followed for pulmonary fibrosis Medical history significant for rheumatoid arthritis Has a strong family history of fibrosis with siblings with pulmonary fibrosis (2 siblings passed away with pulmonary fibrosis) Diagnosed with rheumatoid arthritis in 2008 has been on Humira, Remicade, currently on Rituxan and prednisone 10 mg daily  TEST/EVENTS :  Ct abd 8/10 sub pleural fibrosis  CXR 5/11bibasal scarring  PFTs 7/12 showed preserved lung volumes with FVC 77% &TLC 80%, DLCO reduced at 49%.  She desatn to 86% on second lap.  HRCT 6/12 showed diffuse chronic interstitial coarsening with scattered areas of bronchiectasis bilaterally &subpleural honeycombing consistent with UIP  ONO- 48 min satn <88% >O2 at At bedtime (02/2011 )  - started O2 in oct'12  Trial of Lasix >>not much benefit.  Not interested in rehab  Wonders if her fibrosis is familial but not interested in research study , her sister passed away Oct 20, 2011  2011-11-20 FVC 82%, does not want to perform full PFTs - don't like that clip over my nose  FVC 84% -preserved -  05/2016 HRCT Chest  Pulmonary fibrosis - progressive since 2012 .   12/10/2019 Follow up : Pulmonary Fibrosis  Patient presents for a Patient has known pulmonary fibrosis vs RA-ILD and pulmonary emphysema.  Has a longstanding history of rheumatoid arthritis and is currently on Rituxan and prednisone 10 mg daily.  She is on chronic oxygen with oxygen at 3 L.  She says overall breathing is doing about the same.  She gets winded with minimum activity.  Lives a sedentary lifestyle.  She is able to do some light housework but has to rest frequently.  She does have some family and friends that help her. Has not had her Covid vaccine  declines. Has declined antifibrotic's in the past.  Allergies  Allergen Reactions  . Dilaudid [Hydromorphone Hcl] Nausea And Vomiting  . Penicillins Itching and Rash  . Statins Itching    Immunization History  Administered Date(s) Administered  . Influenza Split 02/14/2011, 02/20/2012, 02/13/2013  . Influenza, High Dose Seasonal PF 02/08/2016, 02/13/2017, 02/17/2018, 02/22/2019  . Influenza-Unspecified 02/13/2014, 03/08/2015  . Pneumococcal Conjugate-13 03/25/2014    Past Medical History:  Diagnosis Date  . Colon, diverticulosis Nov 2007  . Hyperlipemia   . Hypertension   . IBS (irritable bowel syndrome)   . Irregular heart rate   . Personal history of colonic polyps 04/10/2006   hyperplastic  . Pulmonary fibrosis ([place])   . Rheumatoid arthritis(714.0)     Tobacco History: Social History   Tobacco Use  Smoking Status Former Smoker  . Packs/day: 1.00  . Years: 30.00  . Pack years: 30.00  . Types: Cigarettes  . Quit date: 05/16/1988  . Years since quitting: 31.5  Smokeless Tobacco Never Used   Counseling given: Not Answered   Outpatient Medications Prior to Visit  Medication Sig Dispense Refill  . amLODipine (NORVASC) 5 MG tablet Take 2.5 mg by mouth daily.     . cholecalciferol (VITAMIN D) 1000 units tablet Take 1,000 Units by mouth daily.    . furosemide (LASIX) 20 MG tablet Take 1 tablet by mouth Once daily as needed. For swelling    . irbesartan (AVAPRO) 300 MG tablet Take 1 tablet by  mouth daily.    Marland Kitchen loratadine (CLARITIN) 10 MG tablet Take 10 mg by mouth daily.    Vladimir Faster Glycol-Propyl Glycol (SYSTANE) 0.4-0.3 % SOLN Apply 1 drop to eye daily. For dry eyes    . predniSONE (DELTASONE) 10 MG tablet Take 1 tablet (10 mg total) by mouth daily. take 2 tablets / 20 mg daily for 7 days then back down to 10 mg daily 38 tablet 5  . RiTUXimab (RITUXAN IV) Inject 2 application into the vein once. TAKES 2 DOSES IN ONE MONTH TIME, THEN DOESN'T TAKEN AGAIN FOR 6 MONTHS      No facility-administered medications prior to visit.     Review of Systems:   Constitutional:   No  weight loss, night sweats,  Fevers, chills, + fatigue, or  lassitude.  HEENT:   No headaches,  Difficulty swallowing,  Tooth/dental problems, or  Sore throat,                No sneezing, itching, ear ache, nasal congestion, post nasal drip,   CV:  No chest pain,  Orthopnea, PND, swelling in lower extremities, anasarca, dizziness, palpitations, syncope.   GI  No heartburn, indigestion, abdominal pain, nausea, vomiting, diarrhea, change in bowel habits, loss of appetite, bloody stools.   Resp:    No chest wall deformity  Skin: no rash or lesions.  GU: no dysuria, change in color of urine, no urgency or frequency.  No flank pain, no hematuria   MS:  No joint pain or swelling.  No decreased range of motion.  No back pain.    Physical Exam  BP 118/74 (BP Location: Right Arm, Cuff Size: Normal)   Pulse (!) 108   SpO2 95%   GEN: A/Ox3; pleasant , NAD, chronically ill-appearing in wheelchair on oxygen   HEENT:  [place]/AT,   NOSE-clear, THROAT-clear, no lesions, no postnasal drip or exudate noted.   NECK:  Supple w/ fair ROM; no JVD; normal carotid impulses w/o bruits; no thyromegaly or nodules palpated; no lymphadenopathy.    RESP bibasilar crackles  no accessory muscle use, no dullness to percussion  CARD:  RRR, no m/r/g, 1+ peripheral edema, pulses intact, no cyanosis or clubbing.  GI:   Soft & nt; nml bowel sounds; no organomegaly or masses detected.   Musco: Warm bil, no deformities or joint swelling noted.   Neuro: alert, no focal deficits noted.    Skin: Warm, no lesions or rashes    Lab Results:   BNP No results found for: BNP  ProBNP No results found for: PROBNP  Imaging: No results found.    No flowsheet data found.  No results found for: NITRICOXIDE      Assessment & Plan:   Pulmonary emphysema with fibrosis of lung (HCC) Pulmonary  fibrosis vs RA related ILD Appears to be stable continue on current regimen  Plan  Patient Instructions  Continue on current regimen  Activity as tolerated.  Continue on Oxygen 4/m .   Follow up Dr. Elsworth Soho in 4 months and As needed     Chronic respiratory failure ([place]) Continue on current level of oxygen.  No recent increase oxygen demands.     Rexene Edison, NP 12/10/2019

## 2019-12-10 NOTE — Patient Instructions (Signed)
Continue on current regimen  Activity as tolerated.  Continue on Oxygen 4/m .   Follow up Dr. Elsworth Soho in 4 months and As needed

## 2019-12-10 NOTE — Assessment & Plan Note (Addendum)
Pulmonary fibrosis vs RA related ILD Appears to be stable continue on current regimen  Plan  Patient Instructions  Continue on current regimen  Activity as tolerated.  Continue on Oxygen 4/m .   Follow up Dr. Elsworth Soho in 4 months and As needed

## 2019-12-16 ENCOUNTER — Telehealth: Payer: Self-pay | Admitting: Internal Medicine

## 2019-12-16 NOTE — Telephone Encounter (Signed)
Patient not seen since 2013.  Patient will need an office visit prior to any rx.  I left her a message to call back to schedule an office visit with MD or APP

## 2019-12-16 NOTE — Telephone Encounter (Signed)
Cornelius Moras... Dr. Carlean Purl is DOD today. I also  Explained she would need to be seen  She instisted nurse would refill.  Please advise and call pt if appt is needed.

## 2020-03-04 DIAGNOSIS — M0579 Rheumatoid arthritis with rheumatoid factor of multiple sites without organ or systems involvement: Secondary | ICD-10-CM | POA: Diagnosis not present

## 2020-03-04 DIAGNOSIS — E669 Obesity, unspecified: Secondary | ICD-10-CM | POA: Diagnosis not present

## 2020-03-04 DIAGNOSIS — Z6831 Body mass index (BMI) 31.0-31.9, adult: Secondary | ICD-10-CM | POA: Diagnosis not present

## 2020-03-04 DIAGNOSIS — J8410 Pulmonary fibrosis, unspecified: Secondary | ICD-10-CM | POA: Diagnosis not present

## 2020-03-04 DIAGNOSIS — Z23 Encounter for immunization: Secondary | ICD-10-CM | POA: Diagnosis not present

## 2020-03-04 DIAGNOSIS — Z7952 Long term (current) use of systemic steroids: Secondary | ICD-10-CM | POA: Diagnosis not present

## 2020-03-04 DIAGNOSIS — M150 Primary generalized (osteo)arthritis: Secondary | ICD-10-CM | POA: Diagnosis not present

## 2020-03-04 DIAGNOSIS — M25512 Pain in left shoulder: Secondary | ICD-10-CM | POA: Diagnosis not present

## 2020-03-05 ENCOUNTER — Other Ambulatory Visit (HOSPITAL_COMMUNITY): Payer: Self-pay | Admitting: *Deleted

## 2020-03-06 ENCOUNTER — Ambulatory Visit (HOSPITAL_COMMUNITY)
Admission: RE | Admit: 2020-03-06 | Discharge: 2020-03-06 | Disposition: A | Discharge status: Home / Self Care | Payer: Medicare Other | Source: Ambulatory Visit | Attending: Internal Medicine | Admitting: Internal Medicine

## 2020-03-06 ENCOUNTER — Other Ambulatory Visit: Payer: Self-pay

## 2020-03-06 DIAGNOSIS — M0579 Rheumatoid arthritis with rheumatoid factor of multiple sites without organ or systems involvement: Secondary | ICD-10-CM | POA: Insufficient documentation

## 2020-03-06 LAB — COMPREHENSIVE METABOLIC PANEL
ALT: 11 U/L (ref 0–44)
AST: 23 U/L (ref 15–41)
Albumin: 3.6 g/dL (ref 3.5–5.0)
Alkaline Phosphatase: 70 U/L (ref 38–126)
Anion gap: 15 (ref 5–15)
BUN: 17 mg/dL (ref 8–23)
CO2: 26 mmol/L (ref 22–32)
Calcium: 9.6 mg/dL (ref 8.9–10.3)
Chloride: 98 mmol/L (ref 98–111)
Creatinine, Ser: 0.99 mg/dL (ref 0.44–1.00)
GFR, Estimated: 56 mL/min — ABNORMAL LOW (ref >60)
Glucose, Bld: 88 mg/dL (ref 70–99)
Potassium: 3.7 mmol/L (ref 3.5–5.1)
Sodium: 139 mmol/L (ref 135–145)
Total Bilirubin: 0.9 mg/dL (ref 0.3–1.2)
Total Protein: 6.3 g/dL — ABNORMAL LOW (ref 6.5–8.1)

## 2020-03-06 LAB — CBC WITH DIFFERENTIAL/PLATELET
Abs Immature Granulocytes: 0.11 10*3/uL — ABNORMAL HIGH (ref 0.00–0.07)
Basophils Absolute: 0.1 10*3/uL (ref 0.0–0.1)
Basophils Relative: 1 %
Eosinophils Absolute: 0.4 10*3/uL (ref 0.0–0.5)
Eosinophils Relative: 3 %
HCT: 45.3 % (ref 36.0–46.0)
Hemoglobin: 14.6 g/dL (ref 12.0–15.0)
Immature Granulocytes: 1 %
Lymphocytes Relative: 21 %
Lymphs Abs: 2.8 10*3/uL (ref 0.7–4.0)
MCH: 27.8 pg (ref 26.0–34.0)
MCHC: 32.2 g/dL (ref 30.0–36.0)
MCV: 86.1 fL (ref 80.0–100.0)
Monocytes Absolute: 1.5 10*3/uL — ABNORMAL HIGH (ref 0.1–1.0)
Monocytes Relative: 11 %
Neutro Abs: 8.5 10*3/uL — ABNORMAL HIGH (ref 1.7–7.7)
Neutrophils Relative %: 63 %
Platelets: 376 10*3/uL (ref 150–400)
RBC: 5.26 MIL/uL — ABNORMAL HIGH (ref 3.87–5.11)
RDW: 13.1 % (ref 11.5–15.5)
WBC: 13.4 10*3/uL — ABNORMAL HIGH (ref 4.0–10.5)
nRBC: 0 % (ref 0.0–0.2)

## 2020-03-06 MED ORDER — ACETAMINOPHEN 325 MG PO TABS: 650.0000 mg | ORAL_TABLET | Freq: Once | ORAL | Status: AC

## 2020-03-06 MED ORDER — METHYLPREDNISOLONE SODIUM SUCC 125 MG IJ SOLR: 100.0000 mg | Freq: Once | INTRAMUSCULAR | Status: AC

## 2020-03-06 MED ORDER — METHYLPREDNISOLONE SODIUM SUCC 125 MG IJ SOLR
INTRAMUSCULAR | Status: AC
  Administered 2020-03-06: 100 mg via INTRAVENOUS
  Filled 2020-03-06: qty 2

## 2020-03-06 MED ORDER — SODIUM CHLORIDE 0.9 % IV SOLN
1000.0000 mg | Freq: Once | INTRAVENOUS | Status: AC
  Administered 2020-03-06: 1000 mg via INTRAVENOUS
  Filled 2020-03-06: qty 100

## 2020-03-06 MED ORDER — ACETAMINOPHEN 325 MG PO TABS
ORAL_TABLET | ORAL | Status: AC
  Administered 2020-03-06: 650 mg via ORAL
  Filled 2020-03-06: qty 2

## 2020-03-13 ENCOUNTER — Ambulatory Visit: Payer: Medicare Other | Admitting: Adult Health

## 2020-03-20 ENCOUNTER — Ambulatory Visit (HOSPITAL_COMMUNITY)
Admission: RE | Admit: 2020-03-20 | Discharge: 2020-03-20 | Disposition: A | Discharge status: Home / Self Care | Payer: Medicare Other | Source: Ambulatory Visit | Attending: Internal Medicine | Admitting: Internal Medicine

## 2020-03-20 ENCOUNTER — Other Ambulatory Visit: Payer: Self-pay

## 2020-03-20 DIAGNOSIS — M0579 Rheumatoid arthritis with rheumatoid factor of multiple sites without organ or systems involvement: Secondary | ICD-10-CM | POA: Insufficient documentation

## 2020-03-20 MED ORDER — ACETAMINOPHEN 325 MG PO TABS: 650.0000 mg | ORAL_TABLET | Freq: Once | ORAL | Status: AC

## 2020-03-20 MED ORDER — SODIUM CHLORIDE 0.9 % IV SOLN
1000.0000 mg | Freq: Once | INTRAVENOUS | Status: AC
  Administered 2020-03-20: 1000 mg via INTRAVENOUS
  Filled 2020-03-20: qty 100

## 2020-03-20 MED ORDER — ACETAMINOPHEN 325 MG PO TABS
ORAL_TABLET | ORAL | Status: AC
  Administered 2020-03-20: 650 mg via ORAL
  Filled 2020-03-20: qty 2

## 2020-03-20 MED ORDER — METHYLPREDNISOLONE SODIUM SUCC 125 MG IJ SOLR
INTRAMUSCULAR | Status: AC
  Administered 2020-03-20: 100 mg via INTRAVENOUS
  Filled 2020-03-20: qty 2

## 2020-03-20 MED ORDER — METHYLPREDNISOLONE SODIUM SUCC 125 MG IJ SOLR: 100.0000 mg | Freq: Once | INTRAMUSCULAR | Status: AC

## 2020-03-23 ENCOUNTER — Other Ambulatory Visit: Payer: Self-pay | Admitting: Pulmonary Disease

## 2020-03-23 NOTE — Telephone Encounter (Addendum)
Pt is requesting refill on PREDNISONE 10MG ** TABLETS next ov 04/16/20. Pt was called and aware meds are at pharmacy/ nothing further needed

## 2020-04-16 ENCOUNTER — Encounter: Payer: Self-pay | Admitting: Pulmonary Disease

## 2020-04-16 ENCOUNTER — Ambulatory Visit (INDEPENDENT_AMBULATORY_CARE_PROVIDER_SITE_OTHER): Payer: Medicare Other

## 2020-04-16 ENCOUNTER — Ambulatory Visit (INDEPENDENT_AMBULATORY_CARE_PROVIDER_SITE_OTHER): Payer: Medicare Other | Admitting: Pulmonary Disease

## 2020-04-16 ENCOUNTER — Other Ambulatory Visit: Payer: Self-pay

## 2020-04-16 VITALS — BP 102/52 | HR 64 | Temp 97.7°F | Ht 63.0 in | Wt 172.0 lb

## 2020-04-16 DIAGNOSIS — J439 Emphysema, unspecified: Secondary | ICD-10-CM | POA: Diagnosis not present

## 2020-04-16 DIAGNOSIS — J8410 Pulmonary fibrosis, unspecified: Secondary | ICD-10-CM

## 2020-04-16 DIAGNOSIS — J9611 Chronic respiratory failure with hypoxia: Secondary | ICD-10-CM | POA: Diagnosis not present

## 2020-04-16 DIAGNOSIS — I10 Essential (primary) hypertension: Secondary | ICD-10-CM

## 2020-04-16 DIAGNOSIS — R634 Abnormal weight loss: Secondary | ICD-10-CM | POA: Diagnosis not present

## 2020-04-16 NOTE — Patient Instructions (Addendum)
CXR today Stop taking amlodipin & record BP

## 2020-04-16 NOTE — Progress Notes (Signed)
   Subjective:    Patient ID: Veronica Stone, female    DOB: 03/31/1932, 86 y.o.   MRN: [id number]  HPI  86 yo remote smoker, quit 1990 with rheumatoid arthritis and pulmonary fibrosis/UIP pattern& chronic resp failure on O2  Has a  family history of siblings with pulmonary fibrosis (2 siblings passed away with pulmonary fibrosis) Diagnosed with rheumatoid arthritis in 2008 has been on Humira, Remicade, currently on Rituxan and prednisone 10 mg daily. Previous attempts by rheumatology to taper prednisone have not been successful.  Breathing is at baseline. No cough or wheezing. She is compliant with oxygen. She continues to lose weight and has lost another 20 pounds in the last 6 months but overall has lost from 246 to 171 pounds, about 70 pounds She complains of occasional dizziness, blood pressure is low today 102/52, no syncope or loss of consciousness     Significant tests/ events reviewed  Ct abd 12/2008 sub pleural fibrosis  HRCT 10/2010 showed diffuse chronic interstitial coarsening with scattered areas of bronchiectasis bilaterally &subpleural honeycombing consistent with UIP HRCT 05/2016-worsening fibrosis compared to 2012, UIP pattern   PFTs 11/2010 showed preserved lung volumes with FVC 77% &TLC 80%, DLCO reduced at 49%.  She desatn to 86% on second lap.  02/2011 ONO- 48 min satn <88% >O2 at At bedtime - started O2  10/2011 FVC 82%, does not want to perform full PFTs - don't like that clip over my nose  FVC 84% -preserved   Review of Systems neg for any significant sore throat, dysphagia, itching, sneezing, nasal congestion or excess/ purulent secretions, fever, chills, sweats, unintended wt loss, pleuritic or exertional cp, hempoptysis, orthopnea pnd or change in chronic leg swelling. Also denies presyncope, palpitations, heartburn, abdominal pain, nausea, vomiting, diarrhea or change in bowel or urinary habits, dysuria,hematuria, rash, arthralgias, visual  complaints, headache, numbness weakness or ataxia.     Objective:   Physical Exam  Gen. Pleasant, elderly, well-nourished, in no distress ENT - no thrush, no pallor/icterus,no post nasal drip Neck: No JVD, no thyromegaly, no carotid bruits Lungs: no use of accessory muscles, no dullness to percussion, bibasal rales no rhonchi  Cardiovascular: Rhythm regular, heart sounds  normal, no murmurs or gallops, no peripheral edema Musculoskeletal: No deformities, no cyanosis or clubbing        Assessment & Plan:

## 2020-04-17 NOTE — Assessment & Plan Note (Signed)
Blood pressure is low today. I have asked her to stop taking amlodipine and keep her blood pressure record and then approach her PCP. Possible that due to her weight loss she does not need this level of antihypertensives anymore

## 2020-04-17 NOTE — Assessment & Plan Note (Signed)
Chest x-ray independently reviewed shows similar pattern of fibrosis as in 2020. HRCT 2018 has shown worsening UIP pattern compared to 2012 -clinically her disease appears to have stabilized. Regardless, she does not want antifibrotic's [place] will not pursue further work-up or more CTs

## 2020-04-17 NOTE — Progress Notes (Signed)
Called and went over xray results per Dr Alva with patient. All questions answered and patient expressed full understanding. Nothing further needed at this time.

## 2020-04-17 NOTE — Assessment & Plan Note (Signed)
Needs up to 3 L of oxygen on ambulation, at rest able to stay off oxygen for short durations

## 2020-04-17 NOTE — Assessment & Plan Note (Signed)
Significant weight loss is of concern. I offered her CT chest/abdomen/pelvis but she would like to defer

## 2020-06-17 DIAGNOSIS — E669 Obesity, unspecified: Secondary | ICD-10-CM | POA: Diagnosis not present

## 2020-06-17 DIAGNOSIS — M150 Primary generalized (osteo)arthritis: Secondary | ICD-10-CM | POA: Diagnosis not present

## 2020-06-17 DIAGNOSIS — M25512 Pain in left shoulder: Secondary | ICD-10-CM | POA: Diagnosis not present

## 2020-06-17 DIAGNOSIS — Z7952 Long term (current) use of systemic steroids: Secondary | ICD-10-CM | POA: Diagnosis not present

## 2020-06-17 DIAGNOSIS — Z6830 Body mass index (BMI) 30.0-30.9, adult: Secondary | ICD-10-CM | POA: Diagnosis not present

## 2020-06-17 DIAGNOSIS — M0579 Rheumatoid arthritis with rheumatoid factor of multiple sites without organ or systems involvement: Secondary | ICD-10-CM | POA: Diagnosis not present

## 2020-06-17 DIAGNOSIS — J8410 Pulmonary fibrosis, unspecified: Secondary | ICD-10-CM | POA: Diagnosis not present

## 2020-08-26 DIAGNOSIS — I10 Essential (primary) hypertension: Secondary | ICD-10-CM | POA: Diagnosis not present

## 2020-08-26 DIAGNOSIS — K219 Gastro-esophageal reflux disease without esophagitis: Secondary | ICD-10-CM | POA: Diagnosis not present

## 2020-08-26 DIAGNOSIS — Z9981 Dependence on supplemental oxygen: Secondary | ICD-10-CM | POA: Diagnosis not present

## 2020-08-26 DIAGNOSIS — E559 Vitamin D deficiency, unspecified: Secondary | ICD-10-CM | POA: Diagnosis not present

## 2020-08-26 DIAGNOSIS — J8410 Pulmonary fibrosis, unspecified: Secondary | ICD-10-CM | POA: Diagnosis not present

## 2020-09-06 IMAGING — DX DG CHEST 2V
2 series · 2 of 2 positions shown · non-contrast
Comparison: 12/18/2017

CLINICAL DATA: Follow-up pulmonary fibrosis

EXAM:
CHEST - 2 VIEW

[chest pa]
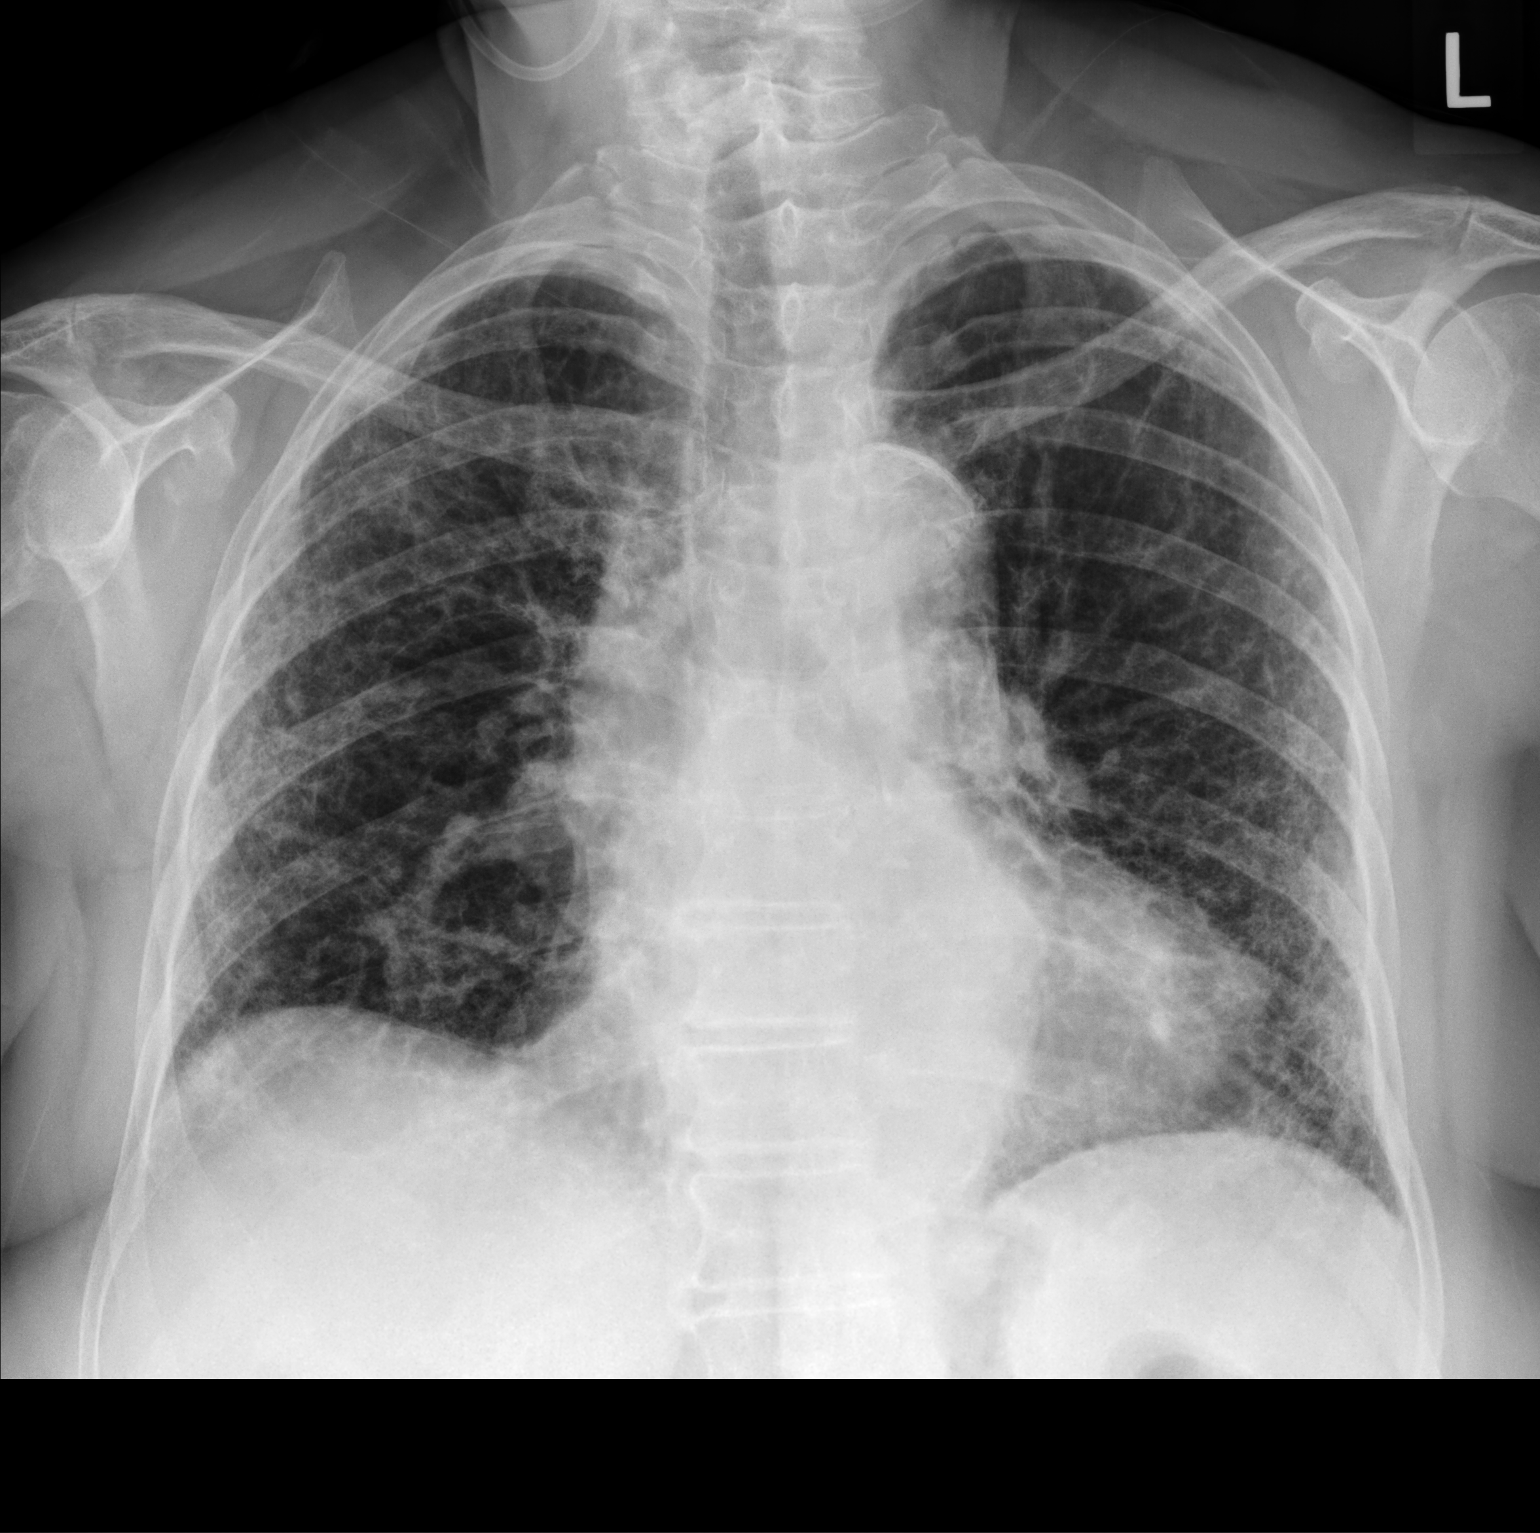

[chest lat]
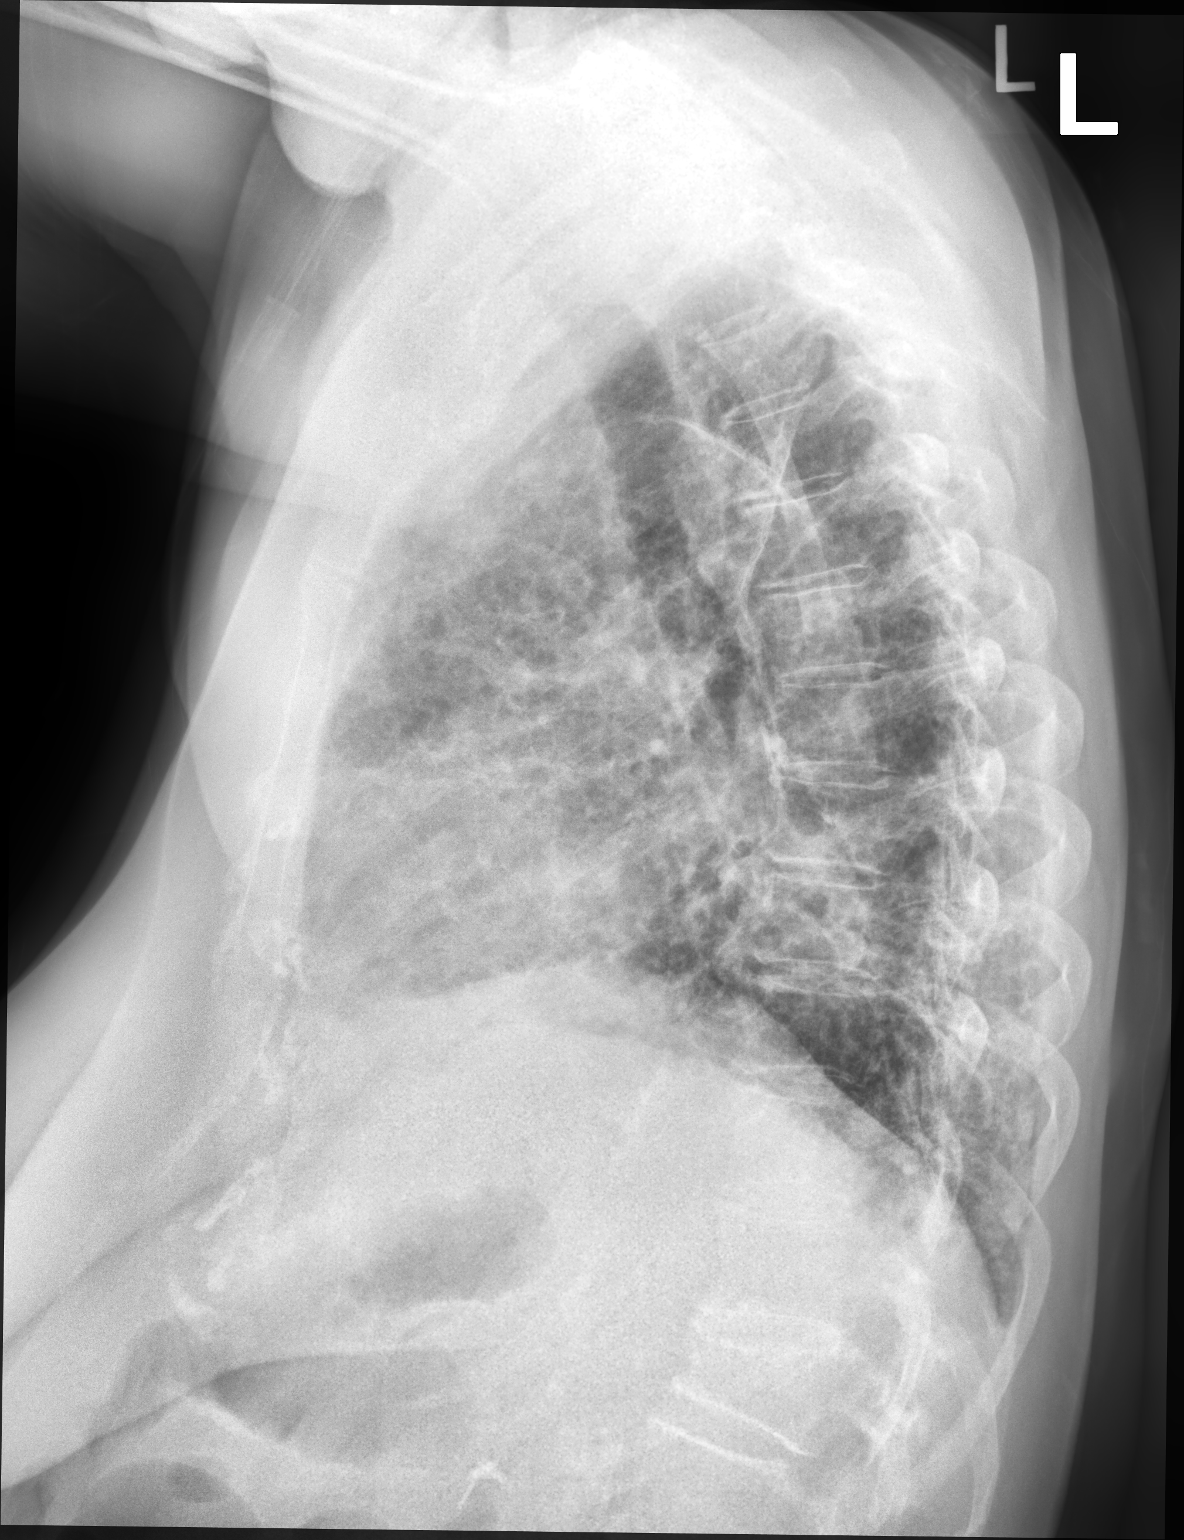

[2 of 2 positions shown; findings below may reference images not displayed]

FINDINGS: It has cardiac shadow is mildly prominent but stable. Aortic
calcifications are again seen. Lungs are well aerated bilaterally.
Chronic fibrotic changes are again seen and stable. No effusion is
noted. No acute bony abnormality is seen. Stable lower thoracic
compression deformity is noted.
IMPRESSION: Chronic fibrotic changes without acute abnormality.

## 2020-09-08 ENCOUNTER — Other Ambulatory Visit (HOSPITAL_COMMUNITY): Payer: Self-pay

## 2020-09-09 ENCOUNTER — Other Ambulatory Visit: Payer: Self-pay

## 2020-09-09 ENCOUNTER — Ambulatory Visit (HOSPITAL_COMMUNITY)
Admission: RE | Admit: 2020-09-09 | Discharge: 2020-09-09 | Disposition: A | Discharge status: Home / Self Care | Payer: Medicare Other | Source: Ambulatory Visit | Attending: Internal Medicine | Admitting: Internal Medicine

## 2020-09-09 DIAGNOSIS — M0579 Rheumatoid arthritis with rheumatoid factor of multiple sites without organ or systems involvement: Secondary | ICD-10-CM | POA: Insufficient documentation

## 2020-09-09 LAB — CBC WITH DIFFERENTIAL/PLATELET
Abs Immature Granulocytes: 0.12 10*3/uL — ABNORMAL HIGH (ref 0.00–0.07)
Basophils Absolute: 0.1 10*3/uL (ref 0.0–0.1)
Basophils Relative: 1 %
Eosinophils Absolute: 0.2 10*3/uL (ref 0.0–0.5)
Eosinophils Relative: 2 %
HCT: 43.6 % (ref 36.0–46.0)
Hemoglobin: 14 g/dL (ref 12.0–15.0)
Immature Granulocytes: 1 %
Lymphocytes Relative: 12 %
Lymphs Abs: 1.7 10*3/uL (ref 0.7–4.0)
MCH: 28.4 pg (ref 26.0–34.0)
MCHC: 32.1 g/dL (ref 30.0–36.0)
MCV: 88.4 fL (ref 80.0–100.0)
Monocytes Absolute: 1.2 10*3/uL — ABNORMAL HIGH (ref 0.1–1.0)
Monocytes Relative: 8 %
Neutro Abs: 10.6 10*3/uL — ABNORMAL HIGH (ref 1.7–7.7)
Neutrophils Relative %: 76 %
Platelets: 329 10*3/uL (ref 150–400)
RBC: 4.93 MIL/uL (ref 3.87–5.11)
RDW: 13.1 % (ref 11.5–15.5)
WBC: 13.8 10*3/uL — ABNORMAL HIGH (ref 4.0–10.5)
nRBC: 0 % (ref 0.0–0.2)

## 2020-09-09 LAB — COMPREHENSIVE METABOLIC PANEL
ALT: 13 U/L (ref 0–44)
AST: 20 U/L (ref 15–41)
Albumin: 3.3 g/dL — ABNORMAL LOW (ref 3.5–5.0)
Alkaline Phosphatase: 55 U/L (ref 38–126)
Anion gap: 12 (ref 5–15)
BUN: 16 mg/dL (ref 8–23)
CO2: 26 mmol/L (ref 22–32)
Calcium: 9.3 mg/dL (ref 8.9–10.3)
Chloride: 100 mmol/L (ref 98–111)
Creatinine, Ser: 0.9 mg/dL (ref 0.44–1.00)
GFR, Estimated: >60 mL/min (ref >60)
Glucose, Bld: 98 mg/dL (ref 70–99)
Potassium: 3.5 mmol/L (ref 3.5–5.1)
Sodium: 138 mmol/L (ref 135–145)
Total Bilirubin: 0.7 mg/dL (ref 0.3–1.2)
Total Protein: 5.7 g/dL — ABNORMAL LOW (ref 6.5–8.1)

## 2020-09-09 MED ORDER — ACETAMINOPHEN 325 MG PO TABS
ORAL_TABLET | ORAL | Status: AC
  Administered 2020-09-09: 650 mg via ORAL
  Filled 2020-09-09: qty 2

## 2020-09-09 MED ORDER — METHYLPREDNISOLONE SODIUM SUCC 125 MG IJ SOLR
INTRAMUSCULAR | Status: AC
  Administered 2020-09-09: 100 mg via INTRAVENOUS
  Filled 2020-09-09: qty 2

## 2020-09-09 MED ORDER — METHYLPREDNISOLONE SODIUM SUCC 125 MG IJ SOLR: 100.0000 mg | Freq: Once | INTRAMUSCULAR | Status: AC

## 2020-09-09 MED ORDER — SODIUM CHLORIDE 0.9 % IV SOLN
1000.0000 mg | INTRAVENOUS | Status: DC
  Administered 2020-09-09: 1000 mg via INTRAVENOUS
  Filled 2020-09-09: qty 100

## 2020-09-09 MED ORDER — ACETAMINOPHEN 325 MG PO TABS: 650.0000 mg | ORAL_TABLET | Freq: Once | ORAL | Status: AC

## 2020-09-21 DIAGNOSIS — M0579 Rheumatoid arthritis with rheumatoid factor of multiple sites without organ or systems involvement: Secondary | ICD-10-CM | POA: Diagnosis not present

## 2020-09-21 DIAGNOSIS — M25512 Pain in left shoulder: Secondary | ICD-10-CM | POA: Diagnosis not present

## 2020-09-21 DIAGNOSIS — Z6832 Body mass index (BMI) 32.0-32.9, adult: Secondary | ICD-10-CM | POA: Diagnosis not present

## 2020-09-21 DIAGNOSIS — E669 Obesity, unspecified: Secondary | ICD-10-CM | POA: Diagnosis not present

## 2020-09-21 DIAGNOSIS — M150 Primary generalized (osteo)arthritis: Secondary | ICD-10-CM | POA: Diagnosis not present

## 2020-09-21 DIAGNOSIS — Z7952 Long term (current) use of systemic steroids: Secondary | ICD-10-CM | POA: Diagnosis not present

## 2020-09-21 DIAGNOSIS — J8410 Pulmonary fibrosis, unspecified: Secondary | ICD-10-CM | POA: Diagnosis not present

## 2020-09-23 ENCOUNTER — Other Ambulatory Visit: Payer: Self-pay

## 2020-09-23 ENCOUNTER — Ambulatory Visit (HOSPITAL_COMMUNITY)
Admission: RE | Admit: 2020-09-23 | Discharge: 2020-09-23 | Disposition: A | Discharge status: Home / Self Care | Payer: Medicare Other | Source: Ambulatory Visit | Attending: Internal Medicine | Admitting: Internal Medicine

## 2020-09-23 DIAGNOSIS — M0579 Rheumatoid arthritis with rheumatoid factor of multiple sites without organ or systems involvement: Secondary | ICD-10-CM | POA: Insufficient documentation

## 2020-09-23 MED ORDER — SODIUM CHLORIDE 0.9 % IV SOLN
1000.0000 mg | INTRAVENOUS | Status: AC
  Administered 2020-09-23: 1000 mg via INTRAVENOUS
  Filled 2020-09-23: qty 100

## 2020-09-23 MED ORDER — METHYLPREDNISOLONE SODIUM SUCC 125 MG IJ SOLR
INTRAMUSCULAR | Status: AC
  Filled 2020-09-23: qty 2

## 2020-09-23 MED ORDER — ACETAMINOPHEN 325 MG PO TABS
650.0000 mg | ORAL_TABLET | Freq: Once | ORAL | Status: AC
  Administered 2020-09-23: 650 mg via ORAL

## 2020-09-23 MED ORDER — ACETAMINOPHEN 325 MG PO TABS
ORAL_TABLET | ORAL | Status: AC
  Filled 2020-09-23: qty 2

## 2020-09-23 MED ORDER — METHYLPREDNISOLONE SODIUM SUCC 125 MG IJ SOLR
100.0000 mg | Freq: Once | INTRAMUSCULAR | Status: AC
  Administered 2020-09-23: 100 mg via INTRAVENOUS

## 2020-10-13 DIAGNOSIS — I10 Essential (primary) hypertension: Secondary | ICD-10-CM | POA: Diagnosis not present

## 2020-10-13 DIAGNOSIS — E785 Hyperlipidemia, unspecified: Secondary | ICD-10-CM | POA: Diagnosis not present

## 2020-10-13 DIAGNOSIS — E039 Hypothyroidism, unspecified: Secondary | ICD-10-CM | POA: Diagnosis not present

## 2020-10-15 ENCOUNTER — Encounter: Payer: Self-pay | Admitting: Adult Health

## 2020-10-15 ENCOUNTER — Other Ambulatory Visit: Payer: Self-pay

## 2020-10-15 ENCOUNTER — Ambulatory Visit (INDEPENDENT_AMBULATORY_CARE_PROVIDER_SITE_OTHER): Payer: Medicare Other | Admitting: Adult Health

## 2020-10-15 DIAGNOSIS — R5381 Other malaise: Secondary | ICD-10-CM | POA: Diagnosis not present

## 2020-10-15 DIAGNOSIS — J8410 Pulmonary fibrosis, unspecified: Secondary | ICD-10-CM | POA: Diagnosis not present

## 2020-10-15 DIAGNOSIS — J9611 Chronic respiratory failure with hypoxia: Secondary | ICD-10-CM | POA: Diagnosis not present

## 2020-10-15 DIAGNOSIS — J439 Emphysema, unspecified: Secondary | ICD-10-CM | POA: Diagnosis not present

## 2020-10-15 NOTE — Addendum Note (Signed)
Addended by: Vanessa Barbara on: 10/15/2020 03:47 PM   Modules accepted: Orders

## 2020-10-15 NOTE — Patient Instructions (Addendum)
Continue on current Regimen.  Activity as tolerated.  Continue on Oxygen 4l/m .   Follow up with Rheumatology  Follow up Dr. Elsworth Soho in 6 months and As needed

## 2020-10-15 NOTE — Progress Notes (Signed)
@Patient  ID: Veronica Stone, female    DOB: 1932/04/24, 87 y.o.   MRN: [id number]  Chief Complaint  Patient presents with  . Follow-up    Referring provider: Deland Pretty, MD  HPI: 87 year old female former smoker quit 1998 followed for pulmonary fibrosis-UIP pattern and chronic respiratory failure Medical history significant for rheumatoid arthritis Strong family history of fibrosis with 2 siblings passed away with pulmonary fibrosis Medical history significant for rheumatoid arthritis diagnosed in 2008.  Previously on Humira, Remicade.  Currently on Rituxan(q6 months )  and prednisone 10 mg daily  TEST/EVENTS :  Ct abd 8/10 sub pleural fibrosis  CXR 5/11bibasal scarring  PFTs 7/12 showed preserved lung volumes with FVC 77% &TLC 80%, DLCO reduced at 49%.  She desatn to 86% on second lap.  HRCT 6/12 showed diffuse chronic interstitial coarsening with scattered areas of bronchiectasis bilaterally &subpleural honeycombing consistent with UIP  ONO- 48 min satn <88% >O2 at At bedtime (02/2011 )  - started O2 in oct'12  Trial of Lasix >>not much benefit.  Not interested in rehab  Wonders if her fibrosis is familial but not interested in research study , her sister passed away 2011-10-04  04-Nov-2011 FVC 82%, does not want to perform full PFTs - don't like that clip over my nose  FVC 84% -preserved -  05/2016 HRCT Chest Pulmonary fibrosis - progressive since 2012 .   10/15/2020 Follow up : Pulmonary Fibrosis  Patient presents for a 74-month follow-up.  Patient has underlying pulmonary fibrosis with UIP pattern on CT scan.  She also has some emphysema.  Patient is followed by rheumatology for long standing history of rheumatoid arthritis and is currently on Rituxan and prednisone 10 mg daily.  She is maintained on oxygen 4 L.  Patient's insurance requires oxygen qualification today.  On room air at rest patient drops O2 saturations at 84%.  Required 4 L to maintain O2 saturation greater than  88 to 90%.  She says overall breathing is doing the same , gets winded with minimal activity . Has no dyspnea at rest. Only gets dyspneic if she is moving around. No flare of cough .  Activity tolerance is very low, has no energy.  We discussed PT at home, she declines.  Patient has declined antifibrotic's in the past. Declines COVID-vaccine.   Allergies  Allergen Reactions  . Dilaudid [Hydromorphone Hcl] Nausea And Vomiting  . Penicillins Itching and Rash  . Statins Itching    Immunization History  Administered Date(s) Administered  . Fluad Quad(high Dose 65+) 02/14/2020  . Influenza Split 02/14/2011, 02/20/2012, 02/13/2013  . Influenza, High Dose Seasonal PF 02/08/2016, 02/13/2017, 02/17/2018, 02/22/2019  . Influenza-Unspecified 02/13/2014, 03/08/2015  . Pneumococcal Conjugate-13 03/25/2014    Past Medical History:  Diagnosis Date  . Colon, diverticulosis Nov 2007  . Hyperlipemia   . Hypertension   . IBS (irritable bowel syndrome)   . Irregular heart rate   . Personal history of colonic polyps 04/10/2006   hyperplastic  . Pulmonary fibrosis ([place])   . Rheumatoid arthritis(714.0)     Tobacco History: Social History   Tobacco Use  Smoking Status Former Smoker  . Packs/day: 1.00  . Years: 30.00  . Pack years: 30.00  . Types: Cigarettes  . Quit date: 05/16/1988  . Years since quitting: 32.4  Smokeless Tobacco Never Used   Counseling given: Not Answered   Outpatient Medications Prior to Visit  Medication Sig Dispense Refill  . amLODipine (NORVASC) 5 MG tablet Take 2.5 mg by  mouth daily.    . cholecalciferol (VITAMIN D) 1000 units tablet Take 1,000 Units by mouth daily.    . furosemide (LASIX) 20 MG tablet Take 1 tablet by mouth Once daily as needed. For swelling    . irbesartan (AVAPRO) 300 MG tablet Take 1 tablet by mouth daily.    Marland Kitchen loratadine (CLARITIN) 10 MG tablet Take 10 mg by mouth daily.    Vladimir Faster Glycol-Propyl Glycol 0.4-0.3 % SOLN Apply 1 drop to eye  daily. For dry eyes    . predniSONE (DELTASONE) 10 MG tablet TAKE 2 TABLETS BY MOUTH DAILY FOR 7 DAYS, THEN BACK DOWN TO 1 TABLET DAILY (Patient taking differently: Take 10 mg by mouth daily with breakfast.) 38 tablet 5  . RiTUXimab (RITUXAN IV) Inject 2 application into the vein once. TAKES 2 DOSES IN ONE MONTH TIME, THEN DOESN'T TAKEN AGAIN FOR 6 MONTHS     No facility-administered medications prior to visit.     Review of Systems:   Constitutional:   No  weight loss, night sweats,  Fevers, chills, fatigue, or  lassitude.  HEENT:   No headaches,  Difficulty swallowing,  Tooth/dental problems, or  Sore throat,                No sneezing, itching, ear ache, nasal congestion, post nasal drip,   CV:  No chest pain,  Orthopnea, PND, swelling in lower extremities, anasarca, dizziness, palpitations, syncope.   GI  No heartburn, indigestion, abdominal pain, nausea, vomiting, diarrhea, change in bowel habits, loss of appetite, bloody stools.   Resp: No shortness of breath with exertion or at rest.  No excess mucus, no productive cough,  No non-productive cough,  No coughing up of blood.  No change in color of mucus.  No wheezing.  No chest wall deformity  Skin: no rash or lesions.  GU: no dysuria, change in color of urine, no urgency or frequency.  No flank pain, no hematuria   MS:  No joint pain or swelling.  No decreased range of motion.  No back pain.    Physical Exam  BP (!) 110/58 (BP Location: Right Arm, Patient Position: Sitting, Cuff Size: Normal)   Pulse (!) 110   Temp (!) 97.5 F (36.4 C) (Temporal)   Ht 5\' 3"  (1.6 m)   Wt 167 lb 12.8 oz (76.1 kg)   SpO2 91%   BMI 29.72 kg/m   GEN: A/Ox3; pleasant , NAD, well nourished    HEENT:  [place]/AT,  EACs-clear, TMs-wnl, NOSE-clear, THROAT-clear, no lesions, no postnasal drip or exudate noted.   NECK:  Supple w/ fair ROM; no JVD; normal carotid impulses w/o bruits; no thyromegaly or nodules palpated; no lymphadenopathy.    RESP   Clear  P & A; w/o, wheezes/ rales/ or rhonchi. no accessory muscle use, no dullness to percussion  CARD:  RRR, no m/r/g, no peripheral edema, pulses intact, no cyanosis or clubbing.  GI:   Soft & nt; nml bowel sounds; no organomegaly or masses detected.   Musco: Warm bil, no deformities or joint swelling noted.   Neuro: alert, no focal deficits noted.    Skin: Warm, no lesions or rashes    Lab Results:  CBC    Component Value Date/Time   WBC 13.8 (H) 09/09/2020 0917   RBC 4.93 09/09/2020 0917   HGB 14.0 09/09/2020 0917   HCT 43.6 09/09/2020 0917   PLT 329 09/09/2020 0917   MCV 88.4 09/09/2020 0917   MCH 28.4  09/09/2020 0917   MCHC 32.1 09/09/2020 0917   RDW 13.1 09/09/2020 0917   LYMPHSABS 1.7 09/09/2020 0917   MONOABS 1.2 (H) 09/09/2020 0917   EOSABS 0.2 09/09/2020 0917   BASOSABS 0.1 09/09/2020 0917    BMET    Component Value Date/Time   NA 138 09/09/2020 0917   K 3.5 09/09/2020 0917   CL 100 09/09/2020 0917   CO2 26 09/09/2020 0917   GLUCOSE 98 09/09/2020 0917   BUN 16 09/09/2020 0917   CREATININE 0.90 09/09/2020 0917   CALCIUM 9.3 09/09/2020 0917   GFRNONAA >60 09/09/2020 0917   GFRAA >60 09/04/2019 0904    BNP No results found for: BNP  ProBNP No results found for: PROBNP  Imaging: No results found.    No flowsheet data found.  No results found for: NITRICOXIDE      Assessment & Plan:   No problem-specific Assessment & Plan notes found for this encounter.     Rexene Edison, NP 10/15/2020

## 2020-10-15 NOTE — Assessment & Plan Note (Signed)
Continue O2 to maintain O2 saturations greater than 88 to 90%.

## 2020-10-15 NOTE — Assessment & Plan Note (Signed)
Patient with pulmonary fibrosis-UIP pattern on CT scan.  Patient also has underlying rheumatoid arthritis so probable rheumatoid arthritis associated ILD.  She has a low activity tolerance and high symptom burden.  Has declined antifibrotic's.  We will continue O2 to maintain sats greater than 88 to 9%.  Declines pulmonary rehab.  And denies in-home physical therapy.  Recommend continuing her current regimen. And follow-up with rheumatology  Plan  Patient Instructions  Continue on current Regimen.  Activity as tolerated.  Continue on Oxygen 4l/m .   Follow up with Rheumatology  Follow up Dr. Elsworth Soho in 6 months and As needed

## 2020-10-15 NOTE — Assessment & Plan Note (Signed)
Activity as tolerated. Declines pulmonary rehab or physical therapy.

## 2020-11-12 DIAGNOSIS — E785 Hyperlipidemia, unspecified: Secondary | ICD-10-CM | POA: Diagnosis not present

## 2020-11-12 DIAGNOSIS — E039 Hypothyroidism, unspecified: Secondary | ICD-10-CM | POA: Diagnosis not present

## 2020-11-12 DIAGNOSIS — I10 Essential (primary) hypertension: Secondary | ICD-10-CM | POA: Diagnosis not present

## 2020-11-12 DIAGNOSIS — M810 Age-related osteoporosis without current pathological fracture: Secondary | ICD-10-CM | POA: Diagnosis not present

## 2020-11-13 ENCOUNTER — Other Ambulatory Visit: Payer: Self-pay | Admitting: Pulmonary Disease

## 2020-11-13 MED ORDER — PREDNISONE 10 MG PO TABS: 10.0000 mg | ORAL_TABLET | Freq: Every day | ORAL | 5 refills | Status: DC

## 2020-11-18 DIAGNOSIS — Z0000 Encounter for general adult medical examination without abnormal findings: Secondary | ICD-10-CM | POA: Diagnosis not present

## 2020-11-18 DIAGNOSIS — E785 Hyperlipidemia, unspecified: Secondary | ICD-10-CM | POA: Diagnosis not present

## 2020-11-18 DIAGNOSIS — E039 Hypothyroidism, unspecified: Secondary | ICD-10-CM | POA: Diagnosis not present

## 2020-11-18 DIAGNOSIS — I10 Essential (primary) hypertension: Secondary | ICD-10-CM | POA: Diagnosis not present

## 2020-11-18 DIAGNOSIS — E559 Vitamin D deficiency, unspecified: Secondary | ICD-10-CM | POA: Diagnosis not present

## 2020-11-23 DIAGNOSIS — J8410 Pulmonary fibrosis, unspecified: Secondary | ICD-10-CM | POA: Diagnosis not present

## 2020-11-23 DIAGNOSIS — E785 Hyperlipidemia, unspecified: Secondary | ICD-10-CM | POA: Diagnosis not present

## 2020-11-23 DIAGNOSIS — E559 Vitamin D deficiency, unspecified: Secondary | ICD-10-CM | POA: Diagnosis not present

## 2020-11-23 DIAGNOSIS — K219 Gastro-esophageal reflux disease without esophagitis: Secondary | ICD-10-CM | POA: Diagnosis not present

## 2020-11-23 DIAGNOSIS — M0579 Rheumatoid arthritis with rheumatoid factor of multiple sites without organ or systems involvement: Secondary | ICD-10-CM | POA: Diagnosis not present

## 2020-11-23 DIAGNOSIS — Z9981 Dependence on supplemental oxygen: Secondary | ICD-10-CM | POA: Diagnosis not present

## 2020-11-23 DIAGNOSIS — Z0000 Encounter for general adult medical examination without abnormal findings: Secondary | ICD-10-CM | POA: Diagnosis not present

## 2020-11-23 DIAGNOSIS — M810 Age-related osteoporosis without current pathological fracture: Secondary | ICD-10-CM | POA: Diagnosis not present

## 2020-11-23 DIAGNOSIS — I10 Essential (primary) hypertension: Secondary | ICD-10-CM | POA: Diagnosis not present

## 2020-12-15 ENCOUNTER — Telehealth: Payer: Self-pay | Admitting: Pulmonary Disease

## 2020-12-15 NOTE — Telephone Encounter (Signed)
ATC patient , [place]   ATC pharmacy and was placed on hold for over 10 minutes will try to call tomorrow

## 2020-12-16 NOTE — Telephone Encounter (Signed)
Called pharmacy and spoke with the pharmacist. She stated that they would go ahead and fill the prednisone for her.   Called and spoke with patient. She is aware that the pharmacy will fill the prednisone for her.   Nothing further needed at time of call.

## 2021-02-24 ENCOUNTER — Other Ambulatory Visit: Payer: Self-pay

## 2021-02-24 ENCOUNTER — Ambulatory Visit (INDEPENDENT_AMBULATORY_CARE_PROVIDER_SITE_OTHER): Payer: Medicare Other

## 2021-02-24 ENCOUNTER — Encounter: Payer: Self-pay | Admitting: Adult Health

## 2021-02-24 ENCOUNTER — Ambulatory Visit (INDEPENDENT_AMBULATORY_CARE_PROVIDER_SITE_OTHER): Payer: Medicare Other | Admitting: Adult Health

## 2021-02-24 ENCOUNTER — Ambulatory Visit: Payer: Medicare Other | Admitting: Primary Care

## 2021-02-24 VITALS — BP 100/60 | HR 82 | Temp 98.0°F | Ht 63.0 in | Wt 167.2 lb

## 2021-02-24 DIAGNOSIS — J8410 Pulmonary fibrosis, unspecified: Secondary | ICD-10-CM

## 2021-02-24 DIAGNOSIS — M05732 Rheumatoid arthritis with rheumatoid factor of left wrist without organ or systems involvement: Secondary | ICD-10-CM

## 2021-02-24 DIAGNOSIS — M05731 Rheumatoid arthritis with rheumatoid factor of right wrist without organ or systems involvement: Secondary | ICD-10-CM

## 2021-02-24 DIAGNOSIS — J439 Emphysema, unspecified: Secondary | ICD-10-CM | POA: Diagnosis not present

## 2021-02-24 DIAGNOSIS — Z23 Encounter for immunization: Secondary | ICD-10-CM | POA: Diagnosis not present

## 2021-02-24 NOTE — Progress Notes (Signed)
@Patient  ID: Veronica Stone, female    DOB: 20-May-1931, 87 y.o.   MRN: [id number]  Chief Complaint  Patient presents with   Follow-up    Referring provider: Deland Pretty, MD  HPI: 87 year old female former smoker quit 1998 followed for pulmonary fibrosis-UIP pattern and chronic respiratory failure Medical history significant for rheumatoid arthritis Strong family history of fibrosis with 2 siblings passed away with pulmonary fibrosis Medical history significant for rheumatoid arthritis diagnosed in 2008.  Previously on Humira and Remicade.  Currently on Rituxan every 6 months.  And daily steroids with prednisone at 10 mg daily  TEST/EVENTS :  Ct abd 8/10 sub pleural fibrosis  CXR 5/11bibasal scarring  PFTs 7/12 showed preserved lung volumes with FVC 77% & TLC 80%, DLCO reduced at 49%.  She desatn to 86% on second lap.  HRCT 6/12 showed diffuse chronic interstitial coarsening with scattered areas of bronchiectasis bilaterally & subpleural honeycombing consistent with UIP  ONO- 48 min satn < 88% >O2 at At bedtime (02/2011 )  - started O2 in oct'12  Trial of Lasix >> not much benefit.  Not interested in rehab  Wonders if her fibrosis is familial but not interested in research study , her sister passed away 10/04/2011  11-04-11 FVC 82%, does not want to perform full PFTs - don't like that clip over my nose  FVC 84% -preserved -   05/2016 HRCT Chest  Pulmonary fibrosis - progressive since 2012 .   Follow up : IPF  Patient returns for 63-month follow-up.  Patient has underlying pulmonary fibrosis with UIP pattern on CT scan.  She also has some underlying emphysema with previous smoking history.  Patient is followed by rheumatology for longstanding history of rheumatoid arthritis maintained on rituxan  and prednisone 10 mg daily.  She is on chronic oxygen at 4 L.  Patient has declined antifibrotic's in the past.  She declines COVID-vaccine.  She says overall she is doing about the same.  Gets  short of breath with minimal activities.  Does feel like that she is getting weaker.  She has had a lot of family stress with multiple deaths in her family lately. Denies any cough or wheezing.  Has been on Valium in past but has new PCP   Allergies  Allergen Reactions   Dilaudid [Hydromorphone Hcl] Nausea And Vomiting   Penicillins Itching and Rash   Statins Itching    Immunization History  Administered Date(s) Administered   Fluad Quad(high Dose 65+) 02/14/2020, 02/24/2021   Influenza Split 02/14/2011, 02/20/2012, 02/13/2013   Influenza, High Dose Seasonal PF 02/08/2016, 02/13/2017, 02/17/2018, 02/22/2019   Influenza-Unspecified 02/13/2014, 03/08/2015   Pneumococcal Conjugate-13 03/25/2014    Past Medical History:  Diagnosis Date   Colon, diverticulosis Nov 2007   Hyperlipemia    Hypertension    IBS (irritable bowel syndrome)    Irregular heart rate    Personal history of colonic polyps 04/10/2006   hyperplastic   Pulmonary fibrosis ([facility])    Rheumatoid arthritis(714.0)     Tobacco History: Social History   Tobacco Use  Smoking Status Former   Packs/day: 1.00   Years: 30.00   Pack years: 30.00   Types: Cigarettes   Quit date: 05/16/1988   Years since quitting: 32.8  Smokeless Tobacco Never   Counseling given: Not Answered   Outpatient Medications Prior to Visit  Medication Sig Dispense Refill   amLODipine (NORVASC) 5 MG tablet Take 2.5 mg by mouth daily.     cholecalciferol (VITAMIN D) 1000  units tablet Take 1,000 Units by mouth daily.     furosemide (LASIX) 20 MG tablet Take 1 tablet by mouth Once daily as needed. For swelling     irbesartan (AVAPRO) 300 MG tablet Take 1 tablet by mouth daily.     loratadine (CLARITIN) 10 MG tablet Take 10 mg by mouth daily.     Polyethyl Glycol-Propyl Glycol 0.4-0.3 % SOLN Apply 1 drop to eye daily. For dry eyes     predniSONE (DELTASONE) 10 MG tablet TAKE 2 TABLETS BY MOUTH DAILY FOR 7 DAYS, THEN BACK DOWN TO 1 TABLET DAILY  (Patient taking differently: Take 10 mg by mouth daily with breakfast.) 38 tablet 5   predniSONE (DELTASONE) 10 MG tablet Take 1 tablet (10 mg total) by mouth daily with breakfast. 30 tablet 5   RiTUXimab (RITUXAN IV) Inject 2 application into the vein once. TAKES 2 DOSES IN ONE MONTH TIME, THEN DOESN'T TAKEN AGAIN FOR 6 MONTHS     No facility-administered medications prior to visit.     Review of Systems:   Constitutional:   No  weight loss, night sweats,  Fevers, chills, + fatigue, or  lassitude.  HEENT:   No headaches,  Difficulty swallowing,  Tooth/dental problems, or  Sore throat,                No sneezing, itching, ear ache, nasal congestion, post nasal drip,   CV:  No chest pain,  Orthopnea, PND, swelling in lower extremities, anasarca, dizziness, palpitations, syncope.   GI  No heartburn, indigestion, abdominal pain, nausea, vomiting, diarrhea, change in bowel habits, loss of appetite, bloody stools.   Resp: .  No chest wall deformity  Skin: no rash or lesions.  GU: no dysuria, change in color of urine, no urgency or frequency.  No flank pain, no hematuria   MS:  No joint pain or swelling.  No decreased range of motion.  No back pain.    Physical Exam  BP 100/60 (BP Location: Right Arm, Cuff Size: Normal)   Pulse 82   Temp 98 F (36.7 C)   Ht 5\' 3"  (1.6 m)   Wt 167 lb 3.2 oz (75.8 kg)   SpO2 95%   BMI 29.62 kg/m   GEN: A/Ox3; pleasant , NAD, elderly and on O2    HEENT:  [place]/AT,  no lesions, no postnasal drip or exudate noted.   NECK:  Supple w/ fair ROM; no JVD; normal carotid impulses w/o bruits; no thyromegaly or nodules palpated; no lymphadenopathy.    RESP bibasilar crackles   no accessory muscle use, no dullness to percussion  CARD:  RRR, no m/r/g, 1+ peripheral edema, pulses intact, no cyanosis or clubbing.  GI:   Soft & nt; nml bowel sounds; no organomegaly or masses detected.   Musco: Warm bil, no deformities or joint swelling noted.   Neuro:  alert, no focal deficits noted.    Skin: Warm, no lesions or rashes    Lab Results:   BNP No results found for: BNP  ProBNP No results found for: PROBNP  Imaging: No results found.    No flowsheet data found.  No results found for: NITRICOXIDE      Assessment & Plan:   Pulmonary emphysema with fibrosis of lung ([place]) Stable with high symptom burden - Declines antifibroitics Chest xray today  Cont follow up with Rheumatology   Plan  Patient Instructions  Chest xray today.  Continue on current Regimen.  Activity as tolerated.  Continue  on Oxygen 4l/m .   Follow up with Rheumatology  Flu shot today .  Follow up Dr. Elsworth Soho in 6 months and As needed       Rheumatoid arthritis ([place]) Cont on current regimen      Rexene Edison, NP 02/24/2021

## 2021-02-24 NOTE — Patient Instructions (Addendum)
Chest xray today.  Continue on current Regimen.  Activity as tolerated.  Continue on Oxygen 4l/m .   Follow up with Rheumatology  Flu shot today .  Follow up Dr. Elsworth Soho in 6 months and As needed

## 2021-02-24 NOTE — Assessment & Plan Note (Signed)
Stable with high symptom burden - Declines antifibroitics Chest xray today  Cont follow up with Rheumatology   Plan  Patient Instructions  Chest xray today.  Continue on current Regimen.  Activity as tolerated.  Continue on Oxygen 4l/m .   Follow up with Rheumatology  Flu shot today .  Follow up Dr. Elsworth Soho in 6 months and As needed

## 2021-02-24 NOTE — Assessment & Plan Note (Signed)
Cont on current regimen  

## 2021-02-26 ENCOUNTER — Telehealth: Payer: Self-pay | Admitting: Adult Health

## 2021-02-26 NOTE — Progress Notes (Signed)
Called and spoke with patient, advised of results/recommendations per Tammy Parrett NP.  She verbalized understanding.  Nothing further needed.

## 2021-02-26 NOTE — Progress Notes (Signed)
ATC x1, LVM to return call.

## 2021-02-26 NOTE — Telephone Encounter (Signed)
Called and spoke with patient regarding CXR results.  Please see results note.  Nothing further needed.

## 2021-03-16 DIAGNOSIS — Z20828 Contact with and (suspected) exposure to other viral communicable diseases: Secondary | ICD-10-CM | POA: Diagnosis not present

## 2021-03-26 ENCOUNTER — Other Ambulatory Visit: Payer: Self-pay

## 2021-03-26 ENCOUNTER — Ambulatory Visit (HOSPITAL_COMMUNITY)
Admission: RE | Admit: 2021-03-26 | Discharge: 2021-03-26 | Disposition: A | Discharge status: Home / Self Care | Payer: Medicare Other | Source: Ambulatory Visit | Attending: Internal Medicine | Admitting: Internal Medicine

## 2021-03-26 ENCOUNTER — Other Ambulatory Visit (HOSPITAL_COMMUNITY): Payer: Self-pay

## 2021-03-26 DIAGNOSIS — M0579 Rheumatoid arthritis with rheumatoid factor of multiple sites without organ or systems involvement: Secondary | ICD-10-CM | POA: Insufficient documentation

## 2021-03-26 DIAGNOSIS — J439 Emphysema, unspecified: Secondary | ICD-10-CM

## 2021-03-26 LAB — COMPREHENSIVE METABOLIC PANEL
ALT: 13 U/L (ref 0–44)
AST: 25 U/L (ref 15–41)
Albumin: 3.3 g/dL — ABNORMAL LOW (ref 3.5–5.0)
Alkaline Phosphatase: 55 U/L (ref 38–126)
Anion gap: 8 (ref 5–15)
BUN: 14 mg/dL (ref 8–23)
CO2: 28 mmol/L (ref 22–32)
Calcium: 9.3 mg/dL (ref 8.9–10.3)
Chloride: 102 mmol/L (ref 98–111)
Creatinine, Ser: 0.92 mg/dL (ref 0.44–1.00)
GFR, Estimated: >60 mL/min (ref >60)
Glucose, Bld: 87 mg/dL (ref 70–99)
Potassium: 3.4 mmol/L — ABNORMAL LOW (ref 3.5–5.1)
Sodium: 138 mmol/L (ref 135–145)
Total Bilirubin: 0.5 mg/dL (ref 0.3–1.2)
Total Protein: 5.4 g/dL — ABNORMAL LOW (ref 6.5–8.1)

## 2021-03-26 LAB — CBC WITH DIFFERENTIAL/PLATELET
Abs Immature Granulocytes: 0.06 10*3/uL (ref 0.00–0.07)
Basophils Absolute: 0.1 10*3/uL (ref 0.0–0.1)
Basophils Relative: 1 %
Eosinophils Absolute: 0.3 10*3/uL (ref 0.0–0.5)
Eosinophils Relative: 3 %
HCT: 44.4 % (ref 36.0–46.0)
Hemoglobin: 14.2 g/dL (ref 12.0–15.0)
Immature Granulocytes: 1 %
Lymphocytes Relative: 25 %
Lymphs Abs: 2.4 10*3/uL (ref 0.7–4.0)
MCH: 27.6 pg (ref 26.0–34.0)
MCHC: 32 g/dL (ref 30.0–36.0)
MCV: 86.4 fL (ref 80.0–100.0)
Monocytes Absolute: 1.2 10*3/uL — ABNORMAL HIGH (ref 0.1–1.0)
Monocytes Relative: 12 %
Neutro Abs: 5.6 10*3/uL (ref 1.7–7.7)
Neutrophils Relative %: 58 %
Platelets: 296 10*3/uL (ref 150–400)
RBC: 5.14 MIL/uL — ABNORMAL HIGH (ref 3.87–5.11)
RDW: 12.6 % (ref 11.5–15.5)
WBC: 9.6 10*3/uL (ref 4.0–10.5)
nRBC: 0 % (ref 0.0–0.2)

## 2021-03-26 MED ORDER — ACETAMINOPHEN 325 MG PO TABS: 650.0000 mg | ORAL_TABLET | Freq: Once | ORAL | Status: DC

## 2021-03-26 MED ORDER — SODIUM CHLORIDE 0.9 % IV SOLN
1000.0000 mg | Freq: Once | INTRAVENOUS | Status: DC
  Administered 2021-03-26: 1000 mg via INTRAVENOUS
  Filled 2021-03-26: qty 100

## 2021-03-26 MED ORDER — ACETAMINOPHEN 325 MG PO TABS
ORAL_TABLET | ORAL | Status: AC
  Administered 2021-03-26: 650 mg via ORAL
  Filled 2021-03-26: qty 2

## 2021-03-26 MED ORDER — METHYLPREDNISOLONE SODIUM SUCC 125 MG IJ SOLR
100.0000 mg | Freq: Once | INTRAMUSCULAR | Status: DC
  Administered 2021-03-26: 100 mg via INTRAVENOUS

## 2021-03-26 MED ORDER — METHYLPREDNISOLONE SODIUM SUCC 125 MG IJ SOLR
INTRAMUSCULAR | Status: AC
  Filled 2021-03-26: qty 2

## 2021-04-12 ENCOUNTER — Other Ambulatory Visit: Payer: Self-pay

## 2021-04-12 ENCOUNTER — Encounter (HOSPITAL_COMMUNITY)
Admission: RE | Admit: 2021-04-12 | Discharge: 2021-04-12 | Disposition: A | Discharge status: Home / Self Care | Payer: Medicare Other | Source: Ambulatory Visit | Attending: Internal Medicine | Admitting: Internal Medicine

## 2021-04-12 DIAGNOSIS — M0579 Rheumatoid arthritis with rheumatoid factor of multiple sites without organ or systems involvement: Secondary | ICD-10-CM | POA: Diagnosis not present

## 2021-04-12 MED ORDER — METHYLPREDNISOLONE SODIUM SUCC 125 MG IJ SOLR
INTRAMUSCULAR | Status: AC
  Administered 2021-04-12: 09:00:00 62.5 mg
  Filled 2021-04-12: qty 2

## 2021-04-12 MED ORDER — METHYLPREDNISOLONE SODIUM SUCC 125 MG IJ SOLR: 100.0000 mg | Freq: Once | INTRAMUSCULAR | Status: DC

## 2021-04-12 MED ORDER — ACETAMINOPHEN 325 MG PO TABS: 650.0000 mg | ORAL_TABLET | Freq: Once | ORAL | Status: DC

## 2021-04-12 MED ORDER — ACETAMINOPHEN 325 MG PO TABS
ORAL_TABLET | ORAL | Status: AC
  Administered 2021-04-12: 09:00:00 650 mg
  Filled 2021-04-12: qty 2

## 2021-04-12 MED ORDER — SODIUM CHLORIDE 0.9 % IV SOLN
1000.0000 mg | Freq: Once | INTRAVENOUS | Status: DC
  Administered 2021-04-12: 10:00:00 1000 mg via INTRAVENOUS
  Filled 2021-04-12: qty 100

## 2021-05-11 ENCOUNTER — Other Ambulatory Visit: Payer: Self-pay | Admitting: Pulmonary Disease

## 2021-05-19 DIAGNOSIS — E785 Hyperlipidemia, unspecified: Secondary | ICD-10-CM | POA: Diagnosis not present

## 2021-05-26 DIAGNOSIS — E559 Vitamin D deficiency, unspecified: Secondary | ICD-10-CM | POA: Diagnosis not present

## 2021-05-26 DIAGNOSIS — M0579 Rheumatoid arthritis with rheumatoid factor of multiple sites without organ or systems involvement: Secondary | ICD-10-CM | POA: Diagnosis not present

## 2021-05-26 DIAGNOSIS — E785 Hyperlipidemia, unspecified: Secondary | ICD-10-CM | POA: Diagnosis not present

## 2021-05-26 DIAGNOSIS — Z9981 Dependence on supplemental oxygen: Secondary | ICD-10-CM | POA: Diagnosis not present

## 2021-05-26 DIAGNOSIS — J8410 Pulmonary fibrosis, unspecified: Secondary | ICD-10-CM | POA: Diagnosis not present

## 2021-05-26 DIAGNOSIS — I10 Essential (primary) hypertension: Secondary | ICD-10-CM | POA: Diagnosis not present

## 2021-07-15 DIAGNOSIS — Z6830 Body mass index (BMI) 30.0-30.9, adult: Secondary | ICD-10-CM | POA: Diagnosis not present

## 2021-07-15 DIAGNOSIS — J8410 Pulmonary fibrosis, unspecified: Secondary | ICD-10-CM | POA: Diagnosis not present

## 2021-07-15 DIAGNOSIS — M150 Primary generalized (osteo)arthritis: Secondary | ICD-10-CM | POA: Diagnosis not present

## 2021-07-15 DIAGNOSIS — Z7952 Long term (current) use of systemic steroids: Secondary | ICD-10-CM | POA: Diagnosis not present

## 2021-07-15 DIAGNOSIS — M25512 Pain in left shoulder: Secondary | ICD-10-CM | POA: Diagnosis not present

## 2021-07-15 DIAGNOSIS — E669 Obesity, unspecified: Secondary | ICD-10-CM | POA: Diagnosis not present

## 2021-07-15 DIAGNOSIS — M0579 Rheumatoid arthritis with rheumatoid factor of multiple sites without organ or systems involvement: Secondary | ICD-10-CM | POA: Diagnosis not present

## 2021-08-18 DIAGNOSIS — H35011 Changes in retinal vascular appearance, right eye: Secondary | ICD-10-CM | POA: Diagnosis not present

## 2021-08-18 DIAGNOSIS — H524 Presbyopia: Secondary | ICD-10-CM | POA: Diagnosis not present

## 2021-08-18 DIAGNOSIS — Z961 Presence of intraocular lens: Secondary | ICD-10-CM | POA: Diagnosis not present

## 2021-08-18 DIAGNOSIS — H31002 Unspecified chorioretinal scars, left eye: Secondary | ICD-10-CM | POA: Diagnosis not present

## 2021-08-18 DIAGNOSIS — H35372 Puckering of macula, left eye: Secondary | ICD-10-CM | POA: Diagnosis not present

## 2021-08-31 DIAGNOSIS — Z20822 Contact with and (suspected) exposure to covid-19: Secondary | ICD-10-CM | POA: Diagnosis not present

## 2021-09-07 ENCOUNTER — Ambulatory Visit (INDEPENDENT_AMBULATORY_CARE_PROVIDER_SITE_OTHER): Payer: Medicare Other | Admitting: Pulmonary Disease

## 2021-09-07 ENCOUNTER — Encounter: Payer: Self-pay | Admitting: Pulmonary Disease

## 2021-09-07 DIAGNOSIS — M05732 Rheumatoid arthritis with rheumatoid factor of left wrist without organ or systems involvement: Secondary | ICD-10-CM | POA: Diagnosis not present

## 2021-09-07 DIAGNOSIS — J8410 Pulmonary fibrosis, unspecified: Secondary | ICD-10-CM

## 2021-09-07 DIAGNOSIS — J439 Emphysema, unspecified: Secondary | ICD-10-CM

## 2021-09-07 DIAGNOSIS — J9611 Chronic respiratory failure with hypoxia: Secondary | ICD-10-CM

## 2021-09-07 DIAGNOSIS — M05731 Rheumatoid arthritis with rheumatoid factor of right wrist without organ or systems involvement: Secondary | ICD-10-CM | POA: Diagnosis not present

## 2021-09-07 NOTE — Assessment & Plan Note (Signed)
She is compliant with oxygen and this is only helped improve her symptoms and functioning. ?Unfortunately she is not a candidate for pulmonary rehab ?

## 2021-09-07 NOTE — Patient Instructions (Signed)
?  Continue on oxygen & prednisone ?

## 2021-09-07 NOTE — Assessment & Plan Note (Signed)
Her fibrosis has a UIP pattern on HRCT.  Although this has worsened on last HRCT in 2018 compared to 2012 and has been relatively stable since then clinically, indicating that this is likely CT ILD rather than IPF.  She continues to desaturate on exertion and has limited mobility.  We have discussed antifibrotic's extensively and she would like to avoid.  She is more interested in quality of life rather than life-prolonging therapies ?Even though CT has shown some emphysema, she does not have significant airway obstruction hence not on bronchodilators.  Previous clinical trial of bronchodilators did not show any benefit ?

## 2021-09-07 NOTE — Assessment & Plan Note (Signed)
Continues on Rituxan and daily prednisone 10 mg ?

## 2021-09-07 NOTE — Progress Notes (Signed)
? ?  Subjective:  ? ? Patient ID: Veronica Stone, female    DOB: 1931/10/06, 88 y.o.   MRN: [id number] ? ?HPI ? ?88 yo remote smoker, quit 1990 with rheumatoid arthritis and pulmonary fibrosis/UIP pattern & chronic resp failure on O2 ?  ?Has a  family history of siblings with pulmonary fibrosis (2 siblings passed away with pulmonary fibrosis) ?Diagnosed with rheumatoid arthritis in 2008 has been on Humira, Remicade, currently on Rituxan and prednisone 10 mg daily. Previous attempts by rheumatology to taper prednisone have not been successful ?Declined anti fibrotics in the past ? ?Reviewed last OV with TP 02/2021 , I last saw her in 2021. ? ?Accompanied by her daughter who corroborates history.  She has no energy and is dyspneic on walking a few steps.  She uses a scooter around the house, rarely gets out of the house except for doctor's appointments.  She continues on 6 monthly Rituxan and 10 mg of prednisone daily. ?Chest x-ray 02/2021 was independently reviewed which shows unchanged pattern of interstitial fibrosis ? ? ? ?Significant tests/ events reviewed ? ?Ct abd 12/2008 sub pleural fibrosis  ?HRCT 10/2010 showed diffuse chronic interstitial coarsening with scattered areas of bronchiectasis bilaterally & subpleural honeycombing consistent with UIP  ?     HRCT 05/2016 -worsening fibrosis compared to 2012, UIP pattern ?  ?  ?PFTs 11/2010 showed preserved lung volumes with FVC 77% & TLC 80%, DLCO reduced at 49%.  ?She desatn to 86% on second lap.  ?02/2011 ONO- 48 min satn < 88% >O2 at At bedtime  - started O2 ?  ?10/2011 FVC 82%, does not want to perform full PFTs - don't like that clip over my nose  ?FVC 84% -preserved ? ? ?Review of Systems ?neg for any significant sore throat, dysphagia, itching, sneezing, nasal congestion or excess/ purulent secretions, fever, chills, sweats, unintended wt loss, pleuritic or exertional cp, hempoptysis, orthopnea pnd or change in chronic leg swelling. Also denies presyncope,  palpitations, heartburn, abdominal pain, nausea, vomiting, diarrhea or change in bowel or urinary habits, dysuria,hematuria, rash, arthralgias, visual complaints, headache, numbness weakness or ataxia. ? ?   ?Objective:  ? Physical Exam ? ?Gen. Pleasant, obese, in no distress ?ENT - no lesions, no post nasal drip ?Neck: No JVD, no thyromegaly, no carotid bruits ?Lungs: no use of accessory muscles, no dullness to percussion, bibasal  rales 1/3 no rhonchi  ?Cardiovascular: Rhythm regular, heart sounds  normal, no murmurs or gallops, no peripheral edema ?Musculoskeletal: No deformities, no cyanosis or clubbing , no tremors ? ? ? ? ?   ?Assessment & Plan:  ? ? ?

## 2021-09-14 DIAGNOSIS — Z20822 Contact with and (suspected) exposure to covid-19: Secondary | ICD-10-CM | POA: Diagnosis not present

## 2021-09-22 DIAGNOSIS — H35372 Puckering of macula, left eye: Secondary | ICD-10-CM | POA: Diagnosis not present

## 2021-09-22 DIAGNOSIS — H47393 Other disorders of optic disc, bilateral: Secondary | ICD-10-CM | POA: Diagnosis not present

## 2021-09-22 DIAGNOSIS — H35011 Changes in retinal vascular appearance, right eye: Secondary | ICD-10-CM | POA: Diagnosis not present

## 2021-09-30 ENCOUNTER — Other Ambulatory Visit (HOSPITAL_COMMUNITY): Payer: Self-pay

## 2021-09-30 DIAGNOSIS — M0579 Rheumatoid arthritis with rheumatoid factor of multiple sites without organ or systems involvement: Secondary | ICD-10-CM

## 2021-10-01 ENCOUNTER — Ambulatory Visit (HOSPITAL_COMMUNITY)
Admission: RE | Admit: 2021-10-01 | Discharge: 2021-10-01 | Disposition: A | Discharge status: Home / Self Care | Payer: Medicare Other | Source: Ambulatory Visit | Attending: Internal Medicine | Admitting: Internal Medicine

## 2021-10-01 DIAGNOSIS — M0579 Rheumatoid arthritis with rheumatoid factor of multiple sites without organ or systems involvement: Secondary | ICD-10-CM | POA: Diagnosis not present

## 2021-10-01 LAB — CBC WITH DIFFERENTIAL/PLATELET
Abs Immature Granulocytes: 0.1 10*3/uL — ABNORMAL HIGH (ref 0.00–0.07)
Basophils Absolute: 0.1 10*3/uL (ref 0.0–0.1)
Basophils Relative: 1 %
Eosinophils Absolute: 0.4 10*3/uL (ref 0.0–0.5)
Eosinophils Relative: 3 %
HCT: 44.6 % (ref 36.0–46.0)
Hemoglobin: 14.7 g/dL (ref 12.0–15.0)
Immature Granulocytes: 1 %
Lymphocytes Relative: 18 %
Lymphs Abs: 2.5 10*3/uL (ref 0.7–4.0)
MCH: 28.9 pg (ref 26.0–34.0)
MCHC: 33 g/dL (ref 30.0–36.0)
MCV: 87.8 fL (ref 80.0–100.0)
Monocytes Absolute: 1.6 10*3/uL — ABNORMAL HIGH (ref 0.1–1.0)
Monocytes Relative: 12 %
Neutro Abs: 8.8 10*3/uL — ABNORMAL HIGH (ref 1.7–7.7)
Neutrophils Relative %: 65 %
Platelets: 316 10*3/uL (ref 150–400)
RBC: 5.08 MIL/uL (ref 3.87–5.11)
RDW: 13.2 % (ref 11.5–15.5)
WBC: 13.5 10*3/uL — ABNORMAL HIGH (ref 4.0–10.5)
nRBC: 0 % (ref 0.0–0.2)

## 2021-10-01 LAB — COMPREHENSIVE METABOLIC PANEL
ALT: 14 U/L (ref 0–44)
AST: 29 U/L (ref 15–41)
Albumin: 3.5 g/dL (ref 3.5–5.0)
Alkaline Phosphatase: 58 U/L (ref 38–126)
Anion gap: 10 (ref 5–15)
BUN: 18 mg/dL (ref 8–23)
CO2: 27 mmol/L (ref 22–32)
Calcium: 9.3 mg/dL (ref 8.9–10.3)
Chloride: 102 mmol/L (ref 98–111)
Creatinine, Ser: 0.92 mg/dL (ref 0.44–1.00)
GFR, Estimated: >60 mL/min (ref >60)
Glucose, Bld: 88 mg/dL (ref 70–99)
Potassium: 3.4 mmol/L — ABNORMAL LOW (ref 3.5–5.1)
Sodium: 139 mmol/L (ref 135–145)
Total Bilirubin: 0.6 mg/dL (ref 0.3–1.2)
Total Protein: 5.7 g/dL — ABNORMAL LOW (ref 6.5–8.1)

## 2021-10-01 MED ORDER — ACETAMINOPHEN 325 MG PO TABS: 650.0000 mg | ORAL_TABLET | Freq: Once | ORAL | Status: AC

## 2021-10-01 MED ORDER — METHYLPREDNISOLONE SODIUM SUCC 125 MG IJ SOLR
INTRAMUSCULAR | Status: AC
  Administered 2021-10-01: 100 mg via INTRAVENOUS
  Filled 2021-10-01: qty 2

## 2021-10-01 MED ORDER — SODIUM CHLORIDE 0.9 % IV SOLN
1000.0000 mg | Freq: Once | INTRAVENOUS | Status: AC
  Administered 2021-10-01: 1000 mg via INTRAVENOUS
  Filled 2021-10-01: qty 100

## 2021-10-01 MED ORDER — ACETAMINOPHEN 325 MG PO TABS
ORAL_TABLET | ORAL | Status: AC
  Administered 2021-10-01: 650 mg via ORAL
  Filled 2021-10-01: qty 2

## 2021-10-01 MED ORDER — METHYLPREDNISOLONE SODIUM SUCC 125 MG IJ SOLR: 100.0000 mg | Freq: Once | INTRAMUSCULAR | Status: AC

## 2021-10-15 ENCOUNTER — Encounter (HOSPITAL_COMMUNITY): Payer: Medicare Other

## 2021-10-19 DIAGNOSIS — J8410 Pulmonary fibrosis, unspecified: Secondary | ICD-10-CM | POA: Diagnosis not present

## 2021-10-19 DIAGNOSIS — M1991 Primary osteoarthritis, unspecified site: Secondary | ICD-10-CM | POA: Diagnosis not present

## 2021-10-19 DIAGNOSIS — M25512 Pain in left shoulder: Secondary | ICD-10-CM | POA: Diagnosis not present

## 2021-10-19 DIAGNOSIS — Z7952 Long term (current) use of systemic steroids: Secondary | ICD-10-CM | POA: Diagnosis not present

## 2021-10-19 DIAGNOSIS — M0579 Rheumatoid arthritis with rheumatoid factor of multiple sites without organ or systems involvement: Secondary | ICD-10-CM | POA: Diagnosis not present

## 2021-10-21 ENCOUNTER — Ambulatory Visit (HOSPITAL_COMMUNITY)
Admission: RE | Admit: 2021-10-21 | Discharge: 2021-10-21 | Disposition: A | Discharge status: Home / Self Care | Payer: Medicare Other | Source: Ambulatory Visit | Attending: Internal Medicine | Admitting: Internal Medicine

## 2021-10-21 DIAGNOSIS — M0579 Rheumatoid arthritis with rheumatoid factor of multiple sites without organ or systems involvement: Secondary | ICD-10-CM | POA: Insufficient documentation

## 2021-10-21 MED ORDER — SODIUM CHLORIDE 0.9 % IV SOLN
1000.0000 mg | Freq: Once | INTRAVENOUS | Status: AC
  Administered 2021-10-21: 1000 mg via INTRAVENOUS
  Filled 2021-10-21: qty 100

## 2021-10-21 MED ORDER — METHYLPREDNISOLONE SODIUM SUCC 125 MG IJ SOLR
INTRAMUSCULAR | Status: AC
  Filled 2021-10-21: qty 2

## 2021-10-21 MED ORDER — METHYLPREDNISOLONE SODIUM SUCC 125 MG IJ SOLR
100.0000 mg | Freq: Once | INTRAMUSCULAR | Status: AC
  Administered 2021-10-21: 100 mg via INTRAVENOUS

## 2021-10-21 MED ORDER — ACETAMINOPHEN 325 MG PO TABS
ORAL_TABLET | ORAL | Status: AC
  Filled 2021-10-21: qty 2

## 2021-10-21 MED ORDER — ACETAMINOPHEN 325 MG PO TABS
650.0000 mg | ORAL_TABLET | Freq: Once | ORAL | Status: AC
  Administered 2021-10-21: 650 mg via ORAL

## 2021-11-08 ENCOUNTER — Other Ambulatory Visit: Payer: Self-pay | Admitting: Pulmonary Disease

## 2021-11-18 DIAGNOSIS — E039 Hypothyroidism, unspecified: Secondary | ICD-10-CM | POA: Diagnosis not present

## 2021-11-18 DIAGNOSIS — I129 Hypertensive chronic kidney disease with stage 1 through stage 4 chronic kidney disease, or unspecified chronic kidney disease: Secondary | ICD-10-CM | POA: Diagnosis not present

## 2021-11-18 DIAGNOSIS — E785 Hyperlipidemia, unspecified: Secondary | ICD-10-CM | POA: Diagnosis not present

## 2021-11-18 DIAGNOSIS — R7989 Other specified abnormal findings of blood chemistry: Secondary | ICD-10-CM | POA: Diagnosis not present

## 2021-11-18 DIAGNOSIS — E559 Vitamin D deficiency, unspecified: Secondary | ICD-10-CM | POA: Diagnosis not present

## 2021-11-25 DIAGNOSIS — M0579 Rheumatoid arthritis with rheumatoid factor of multiple sites without organ or systems involvement: Secondary | ICD-10-CM | POA: Diagnosis not present

## 2021-11-25 DIAGNOSIS — E559 Vitamin D deficiency, unspecified: Secondary | ICD-10-CM | POA: Diagnosis not present

## 2021-11-25 DIAGNOSIS — J9611 Chronic respiratory failure with hypoxia: Secondary | ICD-10-CM | POA: Diagnosis not present

## 2021-11-25 DIAGNOSIS — Z0000 Encounter for general adult medical examination without abnormal findings: Secondary | ICD-10-CM | POA: Diagnosis not present

## 2021-11-25 DIAGNOSIS — J8410 Pulmonary fibrosis, unspecified: Secondary | ICD-10-CM | POA: Diagnosis not present

## 2021-11-25 DIAGNOSIS — E785 Hyperlipidemia, unspecified: Secondary | ICD-10-CM | POA: Diagnosis not present

## 2021-11-25 DIAGNOSIS — I10 Essential (primary) hypertension: Secondary | ICD-10-CM | POA: Diagnosis not present

## 2022-01-19 ENCOUNTER — Telehealth: Payer: Self-pay | Admitting: Pulmonary Disease

## 2022-01-21 NOTE — Telephone Encounter (Signed)
Lm for [facility] with [employer].

## 2022-01-24 DIAGNOSIS — M170 Bilateral primary osteoarthritis of knee: Secondary | ICD-10-CM | POA: Diagnosis not present

## 2022-01-24 DIAGNOSIS — M059 Rheumatoid arthritis with rheumatoid factor, unspecified: Secondary | ICD-10-CM | POA: Diagnosis not present

## 2022-01-24 DIAGNOSIS — Z79891 Long term (current) use of opiate analgesic: Secondary | ICD-10-CM | POA: Diagnosis not present

## 2022-01-24 DIAGNOSIS — M2550 Pain in unspecified joint: Secondary | ICD-10-CM | POA: Diagnosis not present

## 2022-01-24 DIAGNOSIS — G894 Chronic pain syndrome: Secondary | ICD-10-CM | POA: Diagnosis not present

## 2022-02-04 ENCOUNTER — Telehealth: Payer: Self-pay | Admitting: Pulmonary Disease

## 2022-02-04 DIAGNOSIS — J439 Emphysema, unspecified: Secondary | ICD-10-CM

## 2022-02-04 DIAGNOSIS — J9611 Chronic respiratory failure with hypoxia: Secondary | ICD-10-CM

## 2022-02-04 NOTE — Telephone Encounter (Signed)
Patient states she is not getting the oxygen that she needs at night due to nose pieces making scabs in her nose, clogging up her whole right nostril. Patient is having to clean her nostril very well every morning. Patient would like a call back at [phone]

## 2022-02-04 NOTE — Telephone Encounter (Signed)
Veronica Noel, MD  Veronica Stone, CMA; [facility] Triage Pool 1 hour ago (2:45 PM)    Please ensure that she has humidifier with her oxygen at home  She can also use nasal saline drops before bedtime and after waking up     Called and spoke with pt letting her know the info per Dr. Elsworth Soho and she stated that she was unsure if there was a humidifier on her oxygen concentrator. Stated to pt that we would send Rx for humidifier for O2 concentrator to DME for her and she verbalized understanding. Nothing further needed.

## 2022-02-04 NOTE — Telephone Encounter (Signed)
Called and spoke to patient. She states she is having a hard time getting her oxygen at night because the nasal pieces on cannula are causing scabs in her nose and are stopping up her right nostril. She is having to "deep clean" her nostril every morning. She states she is using vaseline and that is not helping. Dr. Elsworth Soho, please advise what do you recommend for patient?

## 2022-02-22 DIAGNOSIS — M25512 Pain in left shoulder: Secondary | ICD-10-CM | POA: Diagnosis not present

## 2022-02-22 DIAGNOSIS — Z7952 Long term (current) use of systemic steroids: Secondary | ICD-10-CM | POA: Diagnosis not present

## 2022-02-22 DIAGNOSIS — M1991 Primary osteoarthritis, unspecified site: Secondary | ICD-10-CM | POA: Diagnosis not present

## 2022-02-22 DIAGNOSIS — J8410 Pulmonary fibrosis, unspecified: Secondary | ICD-10-CM | POA: Diagnosis not present

## 2022-02-22 DIAGNOSIS — M0579 Rheumatoid arthritis with rheumatoid factor of multiple sites without organ or systems involvement: Secondary | ICD-10-CM | POA: Diagnosis not present

## 2022-03-02 DIAGNOSIS — M2550 Pain in unspecified joint: Secondary | ICD-10-CM | POA: Diagnosis not present

## 2022-03-02 DIAGNOSIS — M059 Rheumatoid arthritis with rheumatoid factor, unspecified: Secondary | ICD-10-CM | POA: Diagnosis not present

## 2022-03-02 DIAGNOSIS — M170 Bilateral primary osteoarthritis of knee: Secondary | ICD-10-CM | POA: Diagnosis not present

## 2022-03-02 DIAGNOSIS — G894 Chronic pain syndrome: Secondary | ICD-10-CM | POA: Diagnosis not present

## 2022-03-02 DIAGNOSIS — Z23 Encounter for immunization: Secondary | ICD-10-CM | POA: Diagnosis not present

## 2022-04-01 DIAGNOSIS — H47393 Other disorders of optic disc, bilateral: Secondary | ICD-10-CM | POA: Diagnosis not present

## 2022-04-01 DIAGNOSIS — H35011 Changes in retinal vascular appearance, right eye: Secondary | ICD-10-CM | POA: Diagnosis not present

## 2022-04-01 DIAGNOSIS — H35372 Puckering of macula, left eye: Secondary | ICD-10-CM | POA: Diagnosis not present

## 2022-04-04 ENCOUNTER — Other Ambulatory Visit (HOSPITAL_COMMUNITY): Payer: Self-pay | Admitting: *Deleted

## 2022-04-04 DIAGNOSIS — M0579 Rheumatoid arthritis with rheumatoid factor of multiple sites without organ or systems involvement: Secondary | ICD-10-CM

## 2022-04-06 ENCOUNTER — Ambulatory Visit (HOSPITAL_COMMUNITY)
Admission: RE | Admit: 2022-04-06 | Discharge: 2022-04-06 | Disposition: A | Discharge status: Home / Self Care | Payer: Medicare Other | Source: Ambulatory Visit | Attending: Internal Medicine | Admitting: Internal Medicine

## 2022-04-06 DIAGNOSIS — M0579 Rheumatoid arthritis with rheumatoid factor of multiple sites without organ or systems involvement: Secondary | ICD-10-CM | POA: Insufficient documentation

## 2022-04-06 LAB — CBC WITH DIFFERENTIAL/PLATELET
Abs Immature Granulocytes: 0.12 10*3/uL — ABNORMAL HIGH (ref 0.00–0.07)
Basophils Absolute: 0.1 10*3/uL (ref 0.0–0.1)
Basophils Relative: 1 %
Eosinophils Absolute: 0.4 10*3/uL (ref 0.0–0.5)
Eosinophils Relative: 3 %
HCT: 47.4 % — ABNORMAL HIGH (ref 36.0–46.0)
Hemoglobin: 15.6 g/dL — ABNORMAL HIGH (ref 12.0–15.0)
Immature Granulocytes: 1 %
Lymphocytes Relative: 19 %
Lymphs Abs: 2.4 10*3/uL (ref 0.7–4.0)
MCH: 28.9 pg (ref 26.0–34.0)
MCHC: 32.9 g/dL (ref 30.0–36.0)
MCV: 87.9 fL (ref 80.0–100.0)
Monocytes Absolute: 1.4 10*3/uL — ABNORMAL HIGH (ref 0.1–1.0)
Monocytes Relative: 11 %
Neutro Abs: 8.2 10*3/uL — ABNORMAL HIGH (ref 1.7–7.7)
Neutrophils Relative %: 65 %
Platelets: 333 10*3/uL (ref 150–400)
RBC: 5.39 MIL/uL — ABNORMAL HIGH (ref 3.87–5.11)
RDW: 13.4 % (ref 11.5–15.5)
WBC: 12.6 10*3/uL — ABNORMAL HIGH (ref 4.0–10.5)
nRBC: 0 % (ref 0.0–0.2)

## 2022-04-06 LAB — COMPREHENSIVE METABOLIC PANEL
ALT: 14 U/L (ref 0–44)
AST: 33 U/L (ref 15–41)
Albumin: 3.9 g/dL (ref 3.5–5.0)
Alkaline Phosphatase: 56 U/L (ref 38–126)
Anion gap: 14 (ref 5–15)
BUN: 13 mg/dL (ref 8–23)
CO2: 28 mmol/L (ref 22–32)
Calcium: 9.8 mg/dL (ref 8.9–10.3)
Chloride: 98 mmol/L (ref 98–111)
Creatinine, Ser: 1.03 mg/dL — ABNORMAL HIGH (ref 0.44–1.00)
GFR, Estimated: 52 mL/min — ABNORMAL LOW (ref >60)
Glucose, Bld: 96 mg/dL (ref 70–99)
Potassium: 4.3 mmol/L (ref 3.5–5.1)
Sodium: 140 mmol/L (ref 135–145)
Total Bilirubin: 0.9 mg/dL (ref 0.3–1.2)
Total Protein: 6.3 g/dL — ABNORMAL LOW (ref 6.5–8.1)

## 2022-04-06 MED ORDER — ACETAMINOPHEN 325 MG PO TABS
ORAL_TABLET | ORAL | Status: AC
  Administered 2022-04-06: 650 mg via ORAL
  Filled 2022-04-06: qty 2

## 2022-04-06 MED ORDER — ACETAMINOPHEN 325 MG PO TABS: 650.0000 mg | ORAL_TABLET | Freq: Once | ORAL | Status: DC

## 2022-04-06 MED ORDER — METHYLPREDNISOLONE SODIUM SUCC 125 MG IJ SOLR: 100.0000 mg | Freq: Once | INTRAMUSCULAR | Status: DC

## 2022-04-06 MED ORDER — METHYLPREDNISOLONE SODIUM SUCC 125 MG IJ SOLR
INTRAMUSCULAR | Status: AC
  Administered 2022-04-06: 100 mg via INTRAVENOUS
  Filled 2022-04-06: qty 2

## 2022-04-06 MED ORDER — SODIUM CHLORIDE 0.9 % IV SOLN
1000.0000 mg | Freq: Once | INTRAVENOUS | Status: DC
  Administered 2022-04-06: 1000 mg via INTRAVENOUS
  Filled 2022-04-06: qty 100

## 2022-04-08 ENCOUNTER — Encounter (HOSPITAL_COMMUNITY): Payer: Medicare Other

## 2022-04-20 ENCOUNTER — Ambulatory Visit (HOSPITAL_COMMUNITY)
Admission: RE | Admit: 2022-04-20 | Discharge: 2022-04-20 | Disposition: A | Discharge status: Home / Self Care | Payer: Medicare Other | Source: Ambulatory Visit | Attending: Internal Medicine | Admitting: Internal Medicine

## 2022-04-20 DIAGNOSIS — M0579 Rheumatoid arthritis with rheumatoid factor of multiple sites without organ or systems involvement: Secondary | ICD-10-CM | POA: Insufficient documentation

## 2022-04-20 LAB — COMPREHENSIVE METABOLIC PANEL
ALT: 11 U/L (ref 0–44)
AST: 32 U/L (ref 15–41)
Albumin: 3.6 g/dL (ref 3.5–5.0)
Alkaline Phosphatase: 55 U/L (ref 38–126)
Anion gap: 12 (ref 5–15)
BUN: 14 mg/dL (ref 8–23)
CO2: 27 mmol/L (ref 22–32)
Calcium: 9.2 mg/dL (ref 8.9–10.3)
Chloride: 98 mmol/L (ref 98–111)
Creatinine, Ser: 1.07 mg/dL — ABNORMAL HIGH (ref 0.44–1.00)
GFR, Estimated: 50 mL/min — ABNORMAL LOW (ref >60)
Glucose, Bld: 99 mg/dL (ref 70–99)
Potassium: 3.9 mmol/L (ref 3.5–5.1)
Sodium: 137 mmol/L (ref 135–145)
Total Bilirubin: 1.3 mg/dL — ABNORMAL HIGH (ref 0.3–1.2)
Total Protein: 6 g/dL — ABNORMAL LOW (ref 6.5–8.1)

## 2022-04-20 LAB — CBC WITH DIFFERENTIAL/PLATELET
Abs Immature Granulocytes: 0.13 10*3/uL — ABNORMAL HIGH (ref 0.00–0.07)
Basophils Absolute: 0.1 10*3/uL (ref 0.0–0.1)
Basophils Relative: 1 %
Eosinophils Absolute: 0.3 10*3/uL (ref 0.0–0.5)
Eosinophils Relative: 2 %
HCT: 46.6 % — ABNORMAL HIGH (ref 36.0–46.0)
Hemoglobin: 15 g/dL (ref 12.0–15.0)
Immature Granulocytes: 1 %
Lymphocytes Relative: 9 %
Lymphs Abs: 1.2 10*3/uL (ref 0.7–4.0)
MCH: 28.4 pg (ref 26.0–34.0)
MCHC: 32.2 g/dL (ref 30.0–36.0)
MCV: 88.3 fL (ref 80.0–100.0)
Monocytes Absolute: 1.5 10*3/uL — ABNORMAL HIGH (ref 0.1–1.0)
Monocytes Relative: 11 %
Neutro Abs: 10.1 10*3/uL — ABNORMAL HIGH (ref 1.7–7.7)
Neutrophils Relative %: 76 %
Platelets: 307 10*3/uL (ref 150–400)
RBC: 5.28 MIL/uL — ABNORMAL HIGH (ref 3.87–5.11)
RDW: 13.6 % (ref 11.5–15.5)
WBC: 13.3 10*3/uL — ABNORMAL HIGH (ref 4.0–10.5)
nRBC: 0 % (ref 0.0–0.2)

## 2022-04-20 MED ORDER — METHYLPREDNISOLONE SODIUM SUCC 125 MG IJ SOLR
INTRAMUSCULAR | Status: AC
  Administered 2022-04-20: 100 mg via INTRAVENOUS
  Filled 2022-04-20: qty 2

## 2022-04-20 MED ORDER — ACETAMINOPHEN 325 MG PO TABS
ORAL_TABLET | ORAL | Status: AC
  Administered 2022-04-20: 650 mg via ORAL
  Filled 2022-04-20: qty 2

## 2022-04-20 MED ORDER — SODIUM CHLORIDE 0.9 % IV SOLN
1000.0000 mg | Freq: Once | INTRAVENOUS | Status: AC
  Administered 2022-04-20: 1000 mg via INTRAVENOUS
  Filled 2022-04-20: qty 100

## 2022-04-20 MED ORDER — METHYLPREDNISOLONE SODIUM SUCC 125 MG IJ SOLR: 100.0000 mg | Freq: Once | INTRAMUSCULAR | Status: AC

## 2022-04-20 MED ORDER — ACETAMINOPHEN 325 MG PO TABS: 650.0000 mg | ORAL_TABLET | Freq: Once | ORAL | Status: AC

## 2022-04-27 DIAGNOSIS — M2550 Pain in unspecified joint: Secondary | ICD-10-CM | POA: Diagnosis not present

## 2022-04-27 DIAGNOSIS — G894 Chronic pain syndrome: Secondary | ICD-10-CM | POA: Diagnosis not present

## 2022-04-27 DIAGNOSIS — M170 Bilateral primary osteoarthritis of knee: Secondary | ICD-10-CM | POA: Diagnosis not present

## 2022-04-27 DIAGNOSIS — M059 Rheumatoid arthritis with rheumatoid factor, unspecified: Secondary | ICD-10-CM | POA: Diagnosis not present

## 2022-05-24 ENCOUNTER — Other Ambulatory Visit: Payer: Self-pay

## 2022-05-24 ENCOUNTER — Emergency Department (HOSPITAL_COMMUNITY): Payer: Medicare Other

## 2022-05-24 ENCOUNTER — Encounter (HOSPITAL_COMMUNITY): Payer: Self-pay

## 2022-05-24 ENCOUNTER — Inpatient Hospital Stay (HOSPITAL_COMMUNITY)
Admission: EM | Admit: 2022-05-24 | Discharge: 2022-06-16 | DRG: 175 | Disposition: E | Payer: Medicare Other | Attending: Internal Medicine | Admitting: Internal Medicine

## 2022-05-24 DIAGNOSIS — E669 Obesity, unspecified: Secondary | ICD-10-CM | POA: Diagnosis present

## 2022-05-24 DIAGNOSIS — R0603 Acute respiratory distress: Secondary | ICD-10-CM | POA: Diagnosis not present

## 2022-05-24 DIAGNOSIS — I82461 Acute embolism and thrombosis of right calf muscular vein: Secondary | ICD-10-CM | POA: Diagnosis present

## 2022-05-24 DIAGNOSIS — M7989 Other specified soft tissue disorders: Secondary | ICD-10-CM | POA: Diagnosis present

## 2022-05-24 DIAGNOSIS — D374 Neoplasm of uncertain behavior of colon: Secondary | ICD-10-CM | POA: Diagnosis not present

## 2022-05-24 DIAGNOSIS — Z8739 Personal history of other diseases of the musculoskeletal system and connective tissue: Secondary | ICD-10-CM

## 2022-05-24 DIAGNOSIS — J189 Pneumonia, unspecified organism: Secondary | ICD-10-CM

## 2022-05-24 DIAGNOSIS — I5032 Chronic diastolic (congestive) heart failure: Secondary | ICD-10-CM | POA: Diagnosis present

## 2022-05-24 DIAGNOSIS — A419 Sepsis, unspecified organism: Secondary | ICD-10-CM | POA: Insufficient documentation

## 2022-05-24 DIAGNOSIS — K319 Disease of stomach and duodenum, unspecified: Secondary | ICD-10-CM | POA: Diagnosis not present

## 2022-05-24 DIAGNOSIS — L89322 Pressure ulcer of left buttock, stage 2: Secondary | ICD-10-CM | POA: Diagnosis not present

## 2022-05-24 DIAGNOSIS — E860 Dehydration: Secondary | ICD-10-CM | POA: Diagnosis present

## 2022-05-24 DIAGNOSIS — Z515 Encounter for palliative care: Secondary | ICD-10-CM | POA: Diagnosis not present

## 2022-05-24 DIAGNOSIS — Z888 Allergy status to other drugs, medicaments and biological substances status: Secondary | ICD-10-CM

## 2022-05-24 DIAGNOSIS — Z66 Do not resuscitate: Secondary | ICD-10-CM | POA: Diagnosis not present

## 2022-05-24 DIAGNOSIS — C189 Malignant neoplasm of colon, unspecified: Secondary | ICD-10-CM

## 2022-05-24 DIAGNOSIS — Z95828 Presence of other vascular implants and grafts: Secondary | ICD-10-CM

## 2022-05-24 DIAGNOSIS — I10 Essential (primary) hypertension: Secondary | ICD-10-CM | POA: Diagnosis present

## 2022-05-24 DIAGNOSIS — I82403 Acute embolism and thrombosis of unspecified deep veins of lower extremity, bilateral: Secondary | ICD-10-CM | POA: Diagnosis not present

## 2022-05-24 DIAGNOSIS — D175 Benign lipomatous neoplasm of intra-abdominal organs: Secondary | ICD-10-CM | POA: Diagnosis not present

## 2022-05-24 DIAGNOSIS — R7402 Elevation of levels of lactic acid dehydrogenase (LDH): Secondary | ICD-10-CM | POA: Diagnosis present

## 2022-05-24 DIAGNOSIS — K635 Polyp of colon: Secondary | ICD-10-CM | POA: Diagnosis not present

## 2022-05-24 DIAGNOSIS — L8990 Pressure ulcer of unspecified site, unspecified stage: Secondary | ICD-10-CM | POA: Insufficient documentation

## 2022-05-24 DIAGNOSIS — C187 Malignant neoplasm of sigmoid colon: Secondary | ICD-10-CM | POA: Diagnosis not present

## 2022-05-24 DIAGNOSIS — R230 Cyanosis: Secondary | ICD-10-CM | POA: Diagnosis not present

## 2022-05-24 DIAGNOSIS — I82452 Acute embolism and thrombosis of left peroneal vein: Secondary | ICD-10-CM | POA: Diagnosis present

## 2022-05-24 DIAGNOSIS — R4689 Other symptoms and signs involving appearance and behavior: Secondary | ICD-10-CM | POA: Diagnosis present

## 2022-05-24 DIAGNOSIS — K259 Gastric ulcer, unspecified as acute or chronic, without hemorrhage or perforation: Secondary | ICD-10-CM | POA: Diagnosis present

## 2022-05-24 DIAGNOSIS — Z8261 Family history of arthritis: Secondary | ICD-10-CM

## 2022-05-24 DIAGNOSIS — N179 Acute kidney failure, unspecified: Secondary | ICD-10-CM | POA: Diagnosis present

## 2022-05-24 DIAGNOSIS — Z79620 Long term (current) use of immunosuppressive biologic: Secondary | ICD-10-CM

## 2022-05-24 DIAGNOSIS — Z87891 Personal history of nicotine dependence: Secondary | ICD-10-CM

## 2022-05-24 DIAGNOSIS — F419 Anxiety disorder, unspecified: Secondary | ICD-10-CM | POA: Diagnosis present

## 2022-05-24 DIAGNOSIS — Z880 Allergy status to penicillin: Secondary | ICD-10-CM

## 2022-05-24 DIAGNOSIS — R112 Nausea with vomiting, unspecified: Secondary | ICD-10-CM | POA: Diagnosis not present

## 2022-05-24 DIAGNOSIS — R5381 Other malaise: Secondary | ICD-10-CM | POA: Diagnosis present

## 2022-05-24 DIAGNOSIS — I2729 Other secondary pulmonary hypertension: Secondary | ICD-10-CM | POA: Diagnosis not present

## 2022-05-24 DIAGNOSIS — K2951 Unspecified chronic gastritis with bleeding: Secondary | ICD-10-CM | POA: Diagnosis not present

## 2022-05-24 DIAGNOSIS — Z833 Family history of diabetes mellitus: Secondary | ICD-10-CM

## 2022-05-24 DIAGNOSIS — I2489 Other forms of acute ischemic heart disease: Secondary | ICD-10-CM | POA: Diagnosis present

## 2022-05-24 DIAGNOSIS — K5730 Diverticulosis of large intestine without perforation or abscess without bleeding: Secondary | ICD-10-CM | POA: Diagnosis present

## 2022-05-24 DIAGNOSIS — Z8711 Personal history of peptic ulcer disease: Secondary | ICD-10-CM

## 2022-05-24 DIAGNOSIS — Z7952 Long term (current) use of systemic steroids: Secondary | ICD-10-CM

## 2022-05-24 DIAGNOSIS — Z8719 Personal history of other diseases of the digestive system: Secondary | ICD-10-CM

## 2022-05-24 DIAGNOSIS — M069 Rheumatoid arthritis, unspecified: Secondary | ICD-10-CM | POA: Diagnosis present

## 2022-05-24 DIAGNOSIS — D509 Iron deficiency anemia, unspecified: Secondary | ICD-10-CM | POA: Diagnosis present

## 2022-05-24 DIAGNOSIS — R0602 Shortness of breath: Secondary | ICD-10-CM | POA: Diagnosis not present

## 2022-05-24 DIAGNOSIS — R6520 Severe sepsis without septic shock: Secondary | ICD-10-CM | POA: Insufficient documentation

## 2022-05-24 DIAGNOSIS — K625 Hemorrhage of anus and rectum: Secondary | ICD-10-CM | POA: Diagnosis not present

## 2022-05-24 DIAGNOSIS — E876 Hypokalemia: Secondary | ICD-10-CM | POA: Diagnosis not present

## 2022-05-24 DIAGNOSIS — K2970 Gastritis, unspecified, without bleeding: Secondary | ICD-10-CM | POA: Diagnosis present

## 2022-05-24 DIAGNOSIS — I959 Hypotension, unspecified: Secondary | ICD-10-CM | POA: Diagnosis not present

## 2022-05-24 DIAGNOSIS — Z885 Allergy status to narcotic agent status: Secondary | ICD-10-CM

## 2022-05-24 DIAGNOSIS — I714 Abdominal aortic aneurysm, without rupture, unspecified: Secondary | ICD-10-CM | POA: Diagnosis present

## 2022-05-24 DIAGNOSIS — Z86718 Personal history of other venous thrombosis and embolism: Secondary | ICD-10-CM | POA: Diagnosis not present

## 2022-05-24 DIAGNOSIS — Z8249 Family history of ischemic heart disease and other diseases of the circulatory system: Secondary | ICD-10-CM

## 2022-05-24 DIAGNOSIS — J449 Chronic obstructive pulmonary disease, unspecified: Secondary | ICD-10-CM | POA: Diagnosis present

## 2022-05-24 DIAGNOSIS — J9621 Acute and chronic respiratory failure with hypoxia: Secondary | ICD-10-CM | POA: Diagnosis present

## 2022-05-24 DIAGNOSIS — Z7189 Other specified counseling: Secondary | ICD-10-CM

## 2022-05-24 DIAGNOSIS — J84112 Idiopathic pulmonary fibrosis: Secondary | ICD-10-CM | POA: Diagnosis not present

## 2022-05-24 DIAGNOSIS — R0609 Other forms of dyspnea: Secondary | ICD-10-CM | POA: Diagnosis not present

## 2022-05-24 DIAGNOSIS — I130 Hypertensive heart and chronic kidney disease with heart failure and stage 1 through stage 4 chronic kidney disease, or unspecified chronic kidney disease: Secondary | ICD-10-CM | POA: Diagnosis present

## 2022-05-24 DIAGNOSIS — Z9981 Dependence on supplemental oxygen: Secondary | ICD-10-CM

## 2022-05-24 DIAGNOSIS — Z79899 Other long term (current) drug therapy: Secondary | ICD-10-CM

## 2022-05-24 DIAGNOSIS — R0902 Hypoxemia: Secondary | ICD-10-CM | POA: Diagnosis not present

## 2022-05-24 DIAGNOSIS — C188 Malignant neoplasm of overlapping sites of colon: Secondary | ICD-10-CM | POA: Diagnosis not present

## 2022-05-24 DIAGNOSIS — I2602 Saddle embolus of pulmonary artery with acute cor pulmonale: Secondary | ICD-10-CM | POA: Diagnosis not present

## 2022-05-24 DIAGNOSIS — E785 Hyperlipidemia, unspecified: Secondary | ICD-10-CM | POA: Diagnosis present

## 2022-05-24 DIAGNOSIS — Z6831 Body mass index (BMI) 31.0-31.9, adult: Secondary | ICD-10-CM

## 2022-05-24 DIAGNOSIS — Z1152 Encounter for screening for COVID-19: Secondary | ICD-10-CM | POA: Diagnosis not present

## 2022-05-24 DIAGNOSIS — T380X5A Adverse effect of glucocorticoids and synthetic analogues, initial encounter: Secondary | ICD-10-CM | POA: Diagnosis not present

## 2022-05-24 DIAGNOSIS — I2699 Other pulmonary embolism without acute cor pulmonale: Principal | ICD-10-CM | POA: Diagnosis present

## 2022-05-24 DIAGNOSIS — J8410 Pulmonary fibrosis, unspecified: Secondary | ICD-10-CM | POA: Diagnosis not present

## 2022-05-24 DIAGNOSIS — R7989 Other specified abnormal findings of blood chemistry: Secondary | ICD-10-CM | POA: Diagnosis not present

## 2022-05-24 DIAGNOSIS — Z9049 Acquired absence of other specified parts of digestive tract: Secondary | ICD-10-CM

## 2022-05-24 DIAGNOSIS — K6289 Other specified diseases of anus and rectum: Secondary | ICD-10-CM | POA: Diagnosis not present

## 2022-05-24 DIAGNOSIS — K922 Gastrointestinal hemorrhage, unspecified: Secondary | ICD-10-CM | POA: Diagnosis not present

## 2022-05-24 DIAGNOSIS — I82409 Acute embolism and thrombosis of unspecified deep veins of unspecified lower extremity: Secondary | ICD-10-CM | POA: Diagnosis not present

## 2022-05-24 DIAGNOSIS — Z86711 Personal history of pulmonary embolism: Secondary | ICD-10-CM

## 2022-05-24 DIAGNOSIS — R531 Weakness: Secondary | ICD-10-CM | POA: Diagnosis not present

## 2022-05-24 DIAGNOSIS — Z8679 Personal history of other diseases of the circulatory system: Secondary | ICD-10-CM

## 2022-05-24 DIAGNOSIS — D127 Benign neoplasm of rectosigmoid junction: Secondary | ICD-10-CM | POA: Diagnosis not present

## 2022-05-24 LAB — COMPREHENSIVE METABOLIC PANEL
ALT: 27 U/L (ref 0–44)
AST: 60 U/L — ABNORMAL HIGH (ref 15–41)
Albumin: 3.4 g/dL — ABNORMAL LOW (ref 3.5–5.0)
Alkaline Phosphatase: 66 U/L (ref 38–126)
Anion gap: 18 — ABNORMAL HIGH (ref 5–15)
BUN: 17 mg/dL (ref 8–23)
CO2: 23 mmol/L (ref 22–32)
Calcium: 9.1 mg/dL (ref 8.9–10.3)
Chloride: 97 mmol/L — ABNORMAL LOW (ref 98–111)
Creatinine, Ser: 1.43 mg/dL — ABNORMAL HIGH (ref 0.44–1.00)
GFR, Estimated: 35 mL/min — ABNORMAL LOW (ref >60)
Glucose, Bld: 165 mg/dL — ABNORMAL HIGH (ref 70–99)
Potassium: 3.9 mmol/L (ref 3.5–5.1)
Sodium: 138 mmol/L (ref 135–145)
Total Bilirubin: 1 mg/dL (ref 0.3–1.2)
Total Protein: 5.9 g/dL — ABNORMAL LOW (ref 6.5–8.1)

## 2022-05-24 LAB — CBC WITH DIFFERENTIAL/PLATELET
Abs Immature Granulocytes: 0.24 10*3/uL — ABNORMAL HIGH (ref 0.00–0.07)
Basophils Absolute: 0.1 10*3/uL (ref 0.0–0.1)
Basophils Relative: 1 %
Eosinophils Absolute: 0 10*3/uL (ref 0.0–0.5)
Eosinophils Relative: 0 %
HCT: 48.8 % — ABNORMAL HIGH (ref 36.0–46.0)
Hemoglobin: 15.6 g/dL — ABNORMAL HIGH (ref 12.0–15.0)
Immature Granulocytes: 1 %
Lymphocytes Relative: 7 %
Lymphs Abs: 1.3 10*3/uL (ref 0.7–4.0)
MCH: 28.4 pg (ref 26.0–34.0)
MCHC: 32 g/dL (ref 30.0–36.0)
MCV: 88.7 fL (ref 80.0–100.0)
Monocytes Absolute: 2.3 10*3/uL — ABNORMAL HIGH (ref 0.1–1.0)
Monocytes Relative: 13 %
Neutro Abs: 14 10*3/uL — ABNORMAL HIGH (ref 1.7–7.7)
Neutrophils Relative %: 78 %
Platelets: 278 10*3/uL (ref 150–400)
RBC: 5.5 MIL/uL — ABNORMAL HIGH (ref 3.87–5.11)
RDW: 13.3 % (ref 11.5–15.5)
WBC: 18 10*3/uL — ABNORMAL HIGH (ref 4.0–10.5)
nRBC: 0 % (ref 0.0–0.2)

## 2022-05-24 LAB — I-STAT VENOUS BLOOD GAS, ED
Acid-Base Excess: 2 mmol/L (ref 0.0–2.0)
Bicarbonate: 26.8 mmol/L (ref 20.0–28.0)
Calcium, Ion: 1.05 mmol/L — ABNORMAL LOW (ref 1.15–1.40)
HCT: 47 % — ABNORMAL HIGH (ref 36.0–46.0)
Hemoglobin: 16 g/dL — ABNORMAL HIGH (ref 12.0–15.0)
O2 Saturation: 67 %
Potassium: 4.2 mmol/L (ref 3.5–5.1)
Sodium: 136 mmol/L (ref 135–145)
TCO2: 28 mmol/L (ref 22–32)
pCO2, Ven: 40.8 mmHg — ABNORMAL LOW (ref 44–60)
pH, Ven: 7.426 (ref 7.25–7.43)
pO2, Ven: 34 mmHg (ref 32–45)

## 2022-05-24 LAB — RESP PANEL BY RT-PCR (RSV, FLU A&B, COVID)  RVPGX2
Influenza A by PCR: NEGATIVE
Influenza B by PCR: NEGATIVE
Resp Syncytial Virus by PCR: NEGATIVE
SARS Coronavirus 2 by RT PCR: NEGATIVE

## 2022-05-24 LAB — LACTIC ACID, PLASMA
Lactic Acid, Venous: 5.2 mmol/L (ref 0.5–1.9)
Lactic Acid, Venous: 2.6 mmol/L (ref 0.5–1.9)

## 2022-05-24 MED ORDER — VANCOMYCIN HCL 1500 MG/300ML IV SOLN
1500.0000 mg | Freq: Once | INTRAVENOUS | Status: AC
  Administered 2022-05-25: 1500 mg via INTRAVENOUS
  Filled 2022-05-24: qty 300

## 2022-05-24 MED ORDER — SODIUM CHLORIDE 0.9 % IV SOLN
2.0000 g | Freq: Once | INTRAVENOUS | Status: AC
  Administered 2022-05-24: 2 g via INTRAVENOUS
  Filled 2022-05-24: qty 12.5

## 2022-05-24 MED ORDER — SODIUM CHLORIDE 0.9 % IV SOLN
500.0000 mg | Freq: Once | INTRAVENOUS | Status: AC
  Administered 2022-05-25: 500 mg via INTRAVENOUS
  Filled 2022-05-24: qty 5

## 2022-05-24 MED ORDER — ALBUTEROL SULFATE (2.5 MG/3ML) 0.083% IN NEBU
5.0000 mg | INHALATION_SOLUTION | Freq: Once | RESPIRATORY_TRACT | Status: AC
  Administered 2022-05-24: 5 mg via RESPIRATORY_TRACT
  Filled 2022-05-24: qty 6

## 2022-05-24 MED ORDER — METRONIDAZOLE 500 MG/100ML IV SOLN
500.0000 mg | Freq: Two times a day (BID) | INTRAVENOUS | Status: DC
  Administered 2022-05-25: 500 mg via INTRAVENOUS
  Filled 2022-05-24: qty 100

## 2022-05-24 MED ORDER — SODIUM CHLORIDE 0.9 % IV BOLUS
500.0000 mL | Freq: Once | INTRAVENOUS | Status: AC
  Administered 2022-05-24: 500 mL via INTRAVENOUS

## 2022-05-24 MED ORDER — METHYLPREDNISOLONE SODIUM SUCC 125 MG IJ SOLR
125.0000 mg | Freq: Once | INTRAMUSCULAR | Status: AC
  Administered 2022-05-24: 125 mg via INTRAVENOUS
  Filled 2022-05-24: qty 2

## 2022-05-24 MED ORDER — IOHEXOL 350 MG/ML SOLN
80.0000 mL | Freq: Once | INTRAVENOUS | Status: AC | PRN
  Administered 2022-05-24: 80 mL via INTRAVENOUS

## 2022-05-24 MED ORDER — SODIUM CHLORIDE 0.9 % IV SOLN
1.0000 g | Freq: Once | INTRAVENOUS | Status: AC
  Administered 2022-05-24: 1 g via INTRAVENOUS
  Filled 2022-05-24: qty 10

## 2022-05-24 NOTE — Subjective & Objective (Signed)
Hx of pulmonary fibrosis and RA She has been more SOB for 2 weeks,had chills yesterday  At baseline on 4L  On arrival cyanotic sat in 80's Started on NRB now on 12 L HF She is on chronic prednisone and remicaid

## 2022-05-24 NOTE — ED Notes (Signed)
Patient taken to CT.

## 2022-05-24 NOTE — H&P (Signed)
Veronica Stone DGU:[id number] DOB: 03-05-1932 DOA: 05/31/2022     PCP: Deland Pretty, MD   Outpatient Specialists:     Pulmonary    Dr. Elsworth Soho   Rheumatology   Patient arrived to ER on 06/10/2022 at [place] by Attending Drenda Freeze, MD   Patient coming from:    home Lives alone,       Chief Complaint:   Chief Complaint  Patient presents with   Shortness of Breath    HPI: Veronica Stone is a 89 y.o. female with medical history significant of pulmonary fibrosis hypertension, rheumatoid arthritis, COPD on chronic oxygen up to 4 L,, history of AAA, gastric ulcer, history of DVT      Presented with worsening show loss of breath Hx of pulmonary fibrosis and RA She has been more SOB for 2 weeks,had chills yesterday  At baseline on 4L  On arrival cyanotic sat in 80's Started on NRB now on 12 L HF She is on chronic prednisone and remicaid  Does not smoke or drink Chronic leg swelling  No recent travel   Has had decreased PO intake no diarrhea no sick contacts Reports mild cough No fever She did not feel good for few days daughter kept trying to bring her to the hospital but she would not   Initial COVID TEST  NEGATIVE   Lab Results  Component Value Date   [place] NEGATIVE 05/27/2022     Regarding pertinent Chronic problems:      HTN on Norvasc IrbeSartain Lasix  History of rheumatoid arthritis with chronic prednisone use and Remicade   obesity-   BMI Readings from Last 1 Encounters:  06/15/2022 31.16 kg/m      COPD -  followed by pulmonology  on baseline oxygen 4L,      Hx of DVT/PE on - not on anticoagulation     CKD stage IIIa- baseline Cr 1.1 Estimated Creatinine Clearance: 27.2 mL/min (A) (by C-G formula based on SCr of 1.43 mg/dL (H)).  Lab Results  Component Value Date   CREATININE 1.43 (H) 05/16/2022   CREATININE 1.07 (H) 04/20/2022   CREATININE 1.03 (H) 04/06/2022     While in ER:    Found to have evidence of acute on  chronic respiratory failure requiring high flow CT chest worrisome for submassive PE with right heart strain  Initial lactic acid up to 5.2   CXR -  NON acute  CTabd/pelvis -  non acute  CTA chest -   PE,  no evidence of infiltrate although some mild groundglass opacities noted  Following Medications were ordered in ER: Medications  sodium chloride 0.9 % bolus 500 mL (has no administration in time range)  cefTRIAXone (ROCEPHIN) 1 g in sodium chloride 0.9 % 100 mL IVPB (has no administration in time range)  azithromycin (ZITHROMAX) 500 mg in sodium chloride 0.9 % 250 mL IVPB (has no administration in time range)  albuterol (PROVENTIL) (2.5 MG/3ML) 0.083% nebulizer solution 5 mg (5 mg Nebulization Given 06/13/2022 1956)  methylPREDNISolone sodium succinate (SOLU-MEDROL) 125 mg/2 mL injection 125 mg (125 mg Intravenous Given 05/16/2022 1956)    ______________    ED Triage Vitals  Enc Vitals Group     BP 06/12/2022 1915 115/87     Pulse Rate 05/22/2022 1915 99     Resp 05/23/2022 1915 (!) 32     Temp 05/20/2022 1915 97.8 F (36.6 C)     Temp Source 06/14/2022 1915 Oral     SpO2  06/03/2022 1915 97 %     Weight 06/01/2022 1917 175 lb 14.8 oz (79.8 kg)     Height 05/23/2022 1917 '5\' 3"'$  (1.6 m)     Head Circumference --      Peak Flow --      Pain Score 05/16/2022 1917 0     Pain Loc --      Pain Edu? --      Excl. in [place]? --   TMAX(24)@     _________________________________________ Significant initial  Findings: Abnormal Labs Reviewed  CBC WITH DIFFERENTIAL/PLATELET - Abnormal; Notable for the following components:      Result Value   WBC 18.0 (*)    RBC 5.50 (*)    Hemoglobin 15.6 (*)    HCT 48.8 (*)    Neutro Abs 14.0 (*)    Monocytes Absolute 2.3 (*)    Abs Immature Granulocytes 0.24 (*)    All other components within normal limits  COMPREHENSIVE METABOLIC PANEL - Abnormal; Notable for the following components:   Chloride 97 (*)    Glucose, Bld 165 (*)    Creatinine, Ser 1.43 (*)    Total  Protein 5.9 (*)    Albumin 3.4 (*)    AST 60 (*)    GFR, Estimated 35 (*)    Anion gap 18 (*)    All other components within normal limits  LACTIC ACID, PLASMA - Abnormal; Notable for the following components:   Lactic Acid, Venous 5.2 (*)    All other components within normal limits  I-STAT VENOUS BLOOD GAS, ED - Abnormal; Notable for the following components:   pCO2, Ven 40.8 (*)    Calcium, Ion 1.05 (*)    HCT 47.0 (*)    Hemoglobin 16.0 (*)    All other components within normal limits    _________________________ Troponin  ordered ECG: Ordered Personally reviewed and interpreted by me showing: HR : 96 Rhythm:  Sinus rhythm Atrial premature complex Right bundle branch block Anterolateral infarct, age indeterminate RBBB  QTC 445    The recent clinical data is shown below. Vitals:   05/16/2022 1915 06/10/2022 1917 05/25/2022 2100 06/04/2022 2130  BP: 115/87  95/70 110/77  Pulse: 99  (!) 104 (!) 105  Resp: (!) 32  (!) 25 (!) 31  Temp: 97.8 F (36.6 C)     TempSrc: Oral     SpO2: 97%  94% 95%  Weight:  79.8 kg    Height:  '5\' 3"'$  (1.6 m)       WBC     Component Value Date/Time   WBC 18.0 (H) 05/31/2022 1936   LYMPHSABS 1.3 05/27/2022 1936   MONOABS 2.3 (H) 06/07/2022 1936   EOSABS 0.0 06/02/2022 1936   BASOSABS 0.1 06/03/2022 1936     Lactic Acid, Venous    Component Value Date/Time   LATICACIDVEN 5.2 (HH) 06/06/2022 1915    Procalcitonin   Ordered Lactic Acid, Venous    Component Value Date/Time   LATICACIDVEN 2.6 ([place]) 06/13/2022 2115       UA  ordered    Results for orders placed or performed during the hospital encounter of 05/16/2022  Resp panel by RT-PCR (RSV, Flu A&B, Covid) Anterior Nasal Swab     Status: None   Collection Time: 05/16/2022  7:15 PM   Specimen: Anterior Nasal Swab  Result Value Ref Range Status   SARS Coronavirus 2 by RT PCR NEGATIVE NEGATIVE Final         Influenza A  by PCR NEGATIVE NEGATIVE Final   Influenza B by PCR NEGATIVE NEGATIVE  Final         Resp Syncytial Virus by PCR NEGATIVE NEGATIVE Final          _______________________________________________ Hospitalist was called for admission for   Pulmonary fibrosis ([place])  Pulmonary embolism   The following Work up has been ordered so far:  Orders Placed This Encounter  Procedures   Blood culture (routine x 2)   Resp panel by RT-PCR (RSV, Flu A&B, Covid) Anterior Nasal Swab   DG Chest Port 1 View   CT Angio Chest PE W and/or Wo Contrast   CT ABDOMEN PELVIS W CONTRAST   CBC with Differential   Comprehensive metabolic panel   Lactic acid, plasma   Check Rectal Temperature   Re-check Vital Signs   Consult to hospitalist   I-Stat venous blood gas, ([facility], [facility], [facility])   EKG 12-Lead   EKG 12-Lead     OTHER Significant initial  Findings:  labs showing:  Recent Labs  Lab 06/01/2022 1936 05/27/2022 1951  NA 138 136  K 3.9 4.2  CO2 23  --   GLUCOSE 165*  --   BUN 17  --   CREATININE 1.43*  --   CALCIUM 9.1  --     Cr    Up from baseline see below Lab Results  Component Value Date   CREATININE 1.43 (H) 05/26/2022   CREATININE 1.07 (H) 04/20/2022   CREATININE 1.03 (H) 04/06/2022    Recent Labs  Lab 05/25/2022 1936  AST 60*  ALT 27  ALKPHOS 66  BILITOT 1.0  PROT 5.9*  ALBUMIN 3.4*   Lab Results  Component Value Date   CALCIUM 9.1 05/16/2022    Plt: Lab Results  Component Value Date   PLT 278 06/06/2022    COVID-19 Labs  No results for input(s): "DDIMER", "FERRITIN", "LDH", "CRP" in the last 72 hours.  Lab Results  Component Value Date   [place] NEGATIVE 05/27/2022     Recent Labs  Lab 05/21/2022 1936 05/16/2022 1951  WBC 18.0*  --   NEUTROABS 14.0*  --   HGB 15.6* 16.0*  HCT 48.8* 47.0*  MCV 88.7  --   PLT 278  --     HG/HCT  Up from baseline see below    Component Value Date/Time   HGB 16.0 (H) 05/31/2022 1951   HCT 47.0 (H) 06/15/2022 1951   MCV 88.7 05/28/2022 1936     DM  labs:  HbA1C: No results for input(s):  "HGBA1C" in the last 8760 hours.       Cultures:    Component Value Date/Time   SDES URINE, CLEAN CATCH 09/26/2009 1846   SPECREQUEST NONE 09/26/2009 1846   CULT  09/26/2009 1846    Multiple bacterial morphotypes present, none predominant. Suggest appropriate recollection if clinically indicated.   REPTSTATUS 09/28/2009 FINAL 09/26/2009 1846     Radiological Exams on Admission: CT Angio Chest PE W and/or Wo Contrast  Addendum Date: 05/16/2022   ADDENDUM REPORT: 05/28/2022 23:49 ADDENDUM: These results were called by telephone at the time of interpretation on 05/30/2022 at 11:49 pm to provider Dr. Roel Cluck, who verbally acknowledged these results. Electronically Signed   By: Ronney Asters M.D.   On: 05/31/2022 23:49   Result Date: 05/26/2022 CLINICAL DATA:  Shortness of breath and chills.  Abdominal pain. EXAM: CT ANGIOGRAPHY CHEST CT ABDOMEN AND PELVIS WITH CONTRAST TECHNIQUE: Multidetector CT imaging of the chest  was performed using the standard protocol during bolus administration of intravenous contrast. Multiplanar CT image reconstructions and MIPs were obtained to evaluate the vascular anatomy. Multidetector CT imaging of the abdomen and pelvis was performed using the standard protocol during bolus administration of intravenous contrast. RADIATION DOSE REDUCTION: This exam was performed according to the departmental dose-optimization program which includes automated exposure control, adjustment of the mA and/or kV according to patient size and/or use of iterative reconstruction technique. CONTRAST:  41m OMNIPAQUE IOHEXOL 350 MG/ML SOLN COMPARISON:  CT chest 06/06/2016.  Lumbar spine x-ray 12/09/2011. FINDINGS: CTA CHEST FINDINGS Cardiovascular: Heart is mildly enlarged. Aorta is normal in size. There are atherosclerotic calcifications of the aorta. There is adequate opacification of the pulmonary arteries to the segmental level. There are lobar and segmental left upper lobe and right upper lobe  pulmonary emboli. Mediastinum/Nodes: No enlarged mediastinal, hilar, or axillary lymph nodes. Thyroid gland, trachea, and esophagus demonstrate no significant findings. Lungs/Pleura: Fibrotic and interstitial opacities are again seen throughout both lungs, increased compared to 2018. There are new patchy ground-glass opacities in the bilateral upper lobes. There is no pleural effusion or pneumothorax. Trachea and central airways are patent. Musculoskeletal: No chest wall abnormality. No acute or significant osseous findings. Review of the MIP images confirms the above findings. CT ABDOMEN and PELVIS FINDINGS Hepatobiliary: No focal liver abnormality is seen. Status post cholecystectomy. No biliary dilatation. Pancreas: Unremarkable. No pancreatic ductal dilatation or surrounding inflammatory changes. Spleen: Normal in size without focal abnormality. Adrenals/Urinary Tract: There are subcentimeter cysts in both kidneys. Otherwise, the kidneys, adrenal glands and bladder are within normal limits. Stomach/Bowel: There is a small hiatal hernia. Stomach is otherwise within normal limits. Appendix is not seen. No evidence of bowel wall thickening, distention, or inflammatory changes. There is sigmoid colon diverticulosis. Vascular/Lymphatic: Aortic atherosclerosis. No enlarged abdominal or pelvic lymph nodes. Reproductive: Status post hysterectomy. No adnexal masses. Other: No abdominal wall hernia or abnormality. No abdominopelvic ascites. Musculoskeletal: L1 compression deformity has mildly progressed compared to 2013. Review of the MIP images confirms the above findings. IMPRESSION: 1. Bilateral upper lobe lobar and segmental pulmonary emboli. Positive for acute PE with CTevidence of right heart strain (RV/LV Ratio = 3.2) consistent with at least submassive (intermediate risk) PE. The presence of right heart strain has been associated with an increased risk of morbidity and mortality. 2. New patchy ground-glass  opacities in the bilateral upper lobes, likely infectious/inflammatory. 3. Findings compatible with chronic interstitial lung disease, increased compared to 2018. 4. No acute localizing process in the abdomen or pelvis. 5. Sigmoid colon diverticulosis. Aortic Atherosclerosis (ICD10-I70.0). Electronically Signed: By: ARonney AstersM.D. On: 06/15/2022 23:40   CT ABDOMEN PELVIS W CONTRAST  Addendum Date: 05/26/2022   ADDENDUM REPORT: 05/17/2022 23:49 ADDENDUM: These results were called by telephone at the time of interpretation on 05/27/2022 at 11:49 pm to provider Dr. DRoel Cluck who verbally acknowledged these results. Electronically Signed   By: ARonney AstersM.D.   On: 06/05/2022 23:49   Result Date: 05/17/2022 CLINICAL DATA:  Shortness of breath and chills.  Abdominal pain. EXAM: CT ANGIOGRAPHY CHEST CT ABDOMEN AND PELVIS WITH CONTRAST TECHNIQUE: Multidetector CT imaging of the chest was performed using the standard protocol during bolus administration of intravenous contrast. Multiplanar CT image reconstructions and MIPs were obtained to evaluate the vascular anatomy. Multidetector CT imaging of the abdomen and pelvis was performed using the standard protocol during bolus administration of intravenous contrast. RADIATION DOSE REDUCTION: This exam was performed according to  the departmental dose-optimization program which includes automated exposure control, adjustment of the mA and/or kV according to patient size and/or use of iterative reconstruction technique. CONTRAST:  67m OMNIPAQUE IOHEXOL 350 MG/ML SOLN COMPARISON:  CT chest 06/06/2016.  Lumbar spine x-ray 12/09/2011. FINDINGS: CTA CHEST FINDINGS Cardiovascular: Heart is mildly enlarged. Aorta is normal in size. There are atherosclerotic calcifications of the aorta. There is adequate opacification of the pulmonary arteries to the segmental level. There are lobar and segmental left upper lobe and right upper lobe pulmonary emboli. Mediastinum/Nodes: No  enlarged mediastinal, hilar, or axillary lymph nodes. Thyroid gland, trachea, and esophagus demonstrate no significant findings. Lungs/Pleura: Fibrotic and interstitial opacities are again seen throughout both lungs, increased compared to 2018. There are new patchy ground-glass opacities in the bilateral upper lobes. There is no pleural effusion or pneumothorax. Trachea and central airways are patent. Musculoskeletal: No chest wall abnormality. No acute or significant osseous findings. Review of the MIP images confirms the above findings. CT ABDOMEN and PELVIS FINDINGS Hepatobiliary: No focal liver abnormality is seen. Status post cholecystectomy. No biliary dilatation. Pancreas: Unremarkable. No pancreatic ductal dilatation or surrounding inflammatory changes. Spleen: Normal in size without focal abnormality. Adrenals/Urinary Tract: There are subcentimeter cysts in both kidneys. Otherwise, the kidneys, adrenal glands and bladder are within normal limits. Stomach/Bowel: There is a small hiatal hernia. Stomach is otherwise within normal limits. Appendix is not seen. No evidence of bowel wall thickening, distention, or inflammatory changes. There is sigmoid colon diverticulosis. Vascular/Lymphatic: Aortic atherosclerosis. No enlarged abdominal or pelvic lymph nodes. Reproductive: Status post hysterectomy. No adnexal masses. Other: No abdominal wall hernia or abnormality. No abdominopelvic ascites. Musculoskeletal: L1 compression deformity has mildly progressed compared to 2013. Review of the MIP images confirms the above findings. IMPRESSION: 1. Bilateral upper lobe lobar and segmental pulmonary emboli. Positive for acute PE with CTevidence of right heart strain (RV/LV Ratio = 3.2) consistent with at least submassive (intermediate risk) PE. The presence of right heart strain has been associated with an increased risk of morbidity and mortality. 2. New patchy ground-glass opacities in the bilateral upper lobes, likely  infectious/inflammatory. 3. Findings compatible with chronic interstitial lung disease, increased compared to 2018. 4. No acute localizing process in the abdomen or pelvis. 5. Sigmoid colon diverticulosis. Aortic Atherosclerosis (ICD10-I70.0). Electronically Signed: By: ARonney AstersM.D. On: 06/10/2022 23:40   DG Chest Port 1 View  Result Date: 05/18/2022 CLINICAL DATA:  SOB EXAM: PORTABLE CHEST 1 VIEW COMPARISON:  02/24/2021 FINDINGS: Cardiac silhouette is enlarged. Pulmonary interstitial changes consistent with fibrosis similar to the previous examination. No pneumothorax. Calcified aorta. No definite pleural effusions. IMPRESSION: Pulmonary fibrosis.  Prominent cardiac silhouette. Electronically Signed   By: JSammie BenchM.D.   On: 06/03/2022 19:44   _______________________________________________________________________________________________________ Latest  Blood pressure 110/77, pulse (!) 105, temperature 97.8 F (36.6 C), temperature source Oral, resp. rate (!) 31, height '5\' 3"'$  (1.6 m), weight 79.8 kg, SpO2 95 %.   Vitals  labs and radiology finding personally reviewed  Review of Systems:    Pertinent positives include:  fatigue, shortness of breath at rest.   dyspnea on exertion,  Constitutional:  No weight loss, night sweats, Fevers, chills, fweight loss  HEENT:  No headaches, Difficulty swallowing,Tooth/dental problems,Sore throat,  No sneezing, itching, ear ache, nasal congestion, post nasal drip,  Cardio-vascular:  No chest pain, Orthopnea, PND, anasarca, dizziness, palpitations.no Bilateral lower extremity swelling  GI:  No heartburn, indigestion, abdominal pain, nausea, vomiting, diarrhea, change in bowel habits, loss of  appetite, melena, blood in stool, hematemesis Resp:  no  No excess mucus, no productive cough, No non-productive cough, No coughing up of blood.No change in color of mucus.No wheezing. Skin:  no rash or lesions. No jaundice GU:  no dysuria, change in  color of urine, no urgency or frequency. No straining to urinate.  No flank pain.  Musculoskeletal:  No joint pain or no joint swelling. No decreased range of motion. No back pain.  Psych:  No change in mood or affect. No depression or anxiety. No memory loss.  Neuro: no localizing neurological complaints, no tingling, no weakness, no double vision, no gait abnormality, no slurred speech, no confusion  All systems reviewed and apart from [place] all are negative _______________________________________________________________________________________________ Past Medical History:   Past Medical History:  Diagnosis Date   Colon, diverticulosis Nov 2007   Hyperlipemia    Hypertension    IBS (irritable bowel syndrome)    Irregular heart rate    Personal history of colonic polyps 04/10/2006   hyperplastic   Pulmonary fibrosis (HCC)    Rheumatoid arthritis(714.0)     Past Surgical History:  Procedure Laterality Date   ABDOMINAL HYSTERECTOMY     APPENDECTOMY     CHOLECYSTECTOMY     CYSTECTOMY     left breast    Social History:  Ambulatory  walker      reports that she quit smoking about 34 years ago. Her smoking use included cigarettes. She has a 30.00 pack-year smoking history. She has never used smokeless tobacco. She reports that she does not drink alcohol and does not use drugs.   Family History:   Family History  Problem Relation Age of Onset   Heart disease Brother    Heart disease Sister    Liver disease Daughter    Diabetes Brother    Rheum arthritis Mother    ______________________________________________________________________________________________ Allergies: Allergies  Allergen Reactions   Dilaudid [Hydromorphone Hcl] Nausea And Vomiting   Penicillins Itching and Rash   Statins Itching     Prior to Admission medications   Medication Sig Start Date End Date Taking? Authorizing Provider  cholecalciferol (VITAMIN D) 1000 units tablet Take 1,000 Units by  mouth daily.   Yes [provider]  furosemide (LASIX) 20 MG tablet Take 1 tablet by mouth daily as needed for edema. 10/02/10  Yes [provider]  HYDROcodone-acetaminophen (NORCO/VICODIN) 5-325 MG tablet Take 1 tablet by mouth in the morning, at noon, and at bedtime.   Yes [provider]  irbesartan (AVAPRO) 300 MG tablet Take 300 mg by mouth daily.   Yes [provider]  loratadine (CLARITIN) 10 MG tablet Take 10 mg by mouth daily as needed for allergies.   Yes [provider]  Polyethyl Glycol-Propyl Glycol 0.4-0.3 % SOLN Place 1 drop into both eyes every other day. For dry eyes   Yes [provider]  predniSONE (DELTASONE) 10 MG tablet TAKE 1 TABLET(10 MG) BY MOUTH DAILY WITH BREAKFAST Patient taking differently: Take 10 mg by mouth daily with breakfast. 05/11/21  Yes Rigoberto Noel, MD  RiTUXimab (RITUXAN IV) Inject 2 application into the vein once. TAKES 2 DOSES IN ONE MONTH TIME, THEN DOESN'T TAKEN AGAIN FOR 6 MONTHS   Yes [provider]    ___________________________________________________________________________________________________ Physical Exam:    06/01/2022    9:30 PM 06/07/2022    9:00 PM 06/01/2022    7:17 PM  Vitals with BMI  Height   '5\' 3"'$   Weight  175 lbs 15 oz  BMI   63.89  Systolic 373 95   Diastolic 77 70   Pulse 428 104      1. General:  in No  Acute distress    Chronically ill  -appearing 2. Psychological: Alert and   Oriented 3. Head/ENT:    Dry Mucous Membranes                          Head Non traumatic, neck supple                          Poor Dentition 4. SKIN: decreased Skin turgor,  Skin clean Dry and intact no rash 5. Heart: Regular rate and rhythm no  Murmur, no Rub or gallop 6. Lungs:   no wheezes  some crackles   7. Abdomen: Soft,  non-tender, Non distended   obese  bowel sounds present 8. Lower extremities: no clubbing, cyanosis, trace  edema 9. Neurologically Grossly intact,  moving all 4 extremities equally   10. MSK: Normal range of motion    Chart has been reviewed  ______________________________________________________________________________________________  Assessment/Plan  89 y.o. female with medical history significant of pulmonary fibrosis hypertension, rheumatoid arthritis, COPD on chronic oxygen up to 4 L,, history of AAA, gastric ulcer, history of DVT     Admitted for   Pulmonary fibrosis ([place]) new PE   Present on Admission:  Essential hypertension  Pulmonary fibrosis ([place])  Acute on chronic respiratory failure with hypoxia ([place])  Pulmonary embolism without acute cor pulmonale (HCC)  Dehydration  Elevated lactic acid level  CAP (community acquired pneumonia)  Hypotension  Elevated troponin     HYPERLIPIDEMIA Not on statin  Essential hypertension Allow permissive hypertension for tonight  Pulmonary fibrosis (HCC) Would benefit from follow-up with pulmonology   Otherwise continue home medications  Acute on chronic respiratory failure with hypoxia ([place])  this patient has acute respiratory failure with Hypoxia as documented by the presence of following: O2 saturatio< 90% on baseline 4 L Likely due to: Pulmonary embolism Provide O2 therapy and titrate as needed  Continuous pulse ox   check Pulse ox with ambulation prior to discharge   may need  TC consult for home O2 set up     Pulmonary embolism without acute cor pulmonale ([place])  Admit to progressive May need transfer to ICU Pt now hypotensive  Starting IV bolus and PCCM consult given Acute large PE  Initiate heparin drip  Would likely benefit from case manager consult for long term anticoagulation Hold home blood pressure medications avoid hypotension Cycle cardiac enzymes Order echogram and lower extremity Dopplers  Most likely risk factors for hypercoagulable state being  hormonal therapy history of rheumatoid arthritis pulmonary hypertension     History of  rheumatoid arthritis Continue home dose of prednisone   Dehydration Will gently rehydrate and follow fluid level.  I suspect worsening leukocytosis in the setting of hemoconcentration expect improvement with rehydration  Elevated lactic acid level In the setting of acute hypoxia and dehydration improving with IV fluid and oxygen  CAP (community acquired pneumonia) Given chills and slightly worsening leukocytosis as well as possibility of groundglass opacities on CT scan findings for tonight coverage of antibiotics Rocephin azithromycin Check procalcitonin Check sputum cultures  Hypotension Hydrocortizone stress dose steroids As pt has been on chronic prednisone   Elevated troponin In the setting of hypotension, hypoxia and PE Echo in AM  Pt  is on HEparin for active PE Email cardiology Continue to cycel Ce  Pt denies any CP no ischemic changes on ECG  Suspect demand ischemia   Other plan as per orders.  DVT prophylaxis:  heparin   Code Status:    Code Status: Not on file FULL CODE   as per patient, pt states to me she has DNR paperwork but for now wanted to be full code and wanted to have her heart restarted in case of cardiac arrest I had personally discussed CODE STATUS with patient      Family Communication:   Family not at  Bedside    Disposition Plan:     To home once workup is complete and patient is stable   Following barriers for discharge:                                                      Will need consultants to evaluate patient prior to discharge                        Consults called:   PCCM has been consulted  Admission status:  ED Disposition     ED Disposition  [place]: [facility] [100100]  Level of Care: Progressive [102]  Admit to Progressive based on following criteria: MULTISYSTEM THREATS such as stable sepsis, metabolic/electrolyte imbalance with or without encephalopathy that is  responding to early treatment.  May admit patient to [facility] or [facility] if equivalent level of care is available:: No  Covid Evaluation: Confirmed COVID Negative  Diagnosis: Severe sepsis [facility]) [[id number]]  Admitting Physician: Toy Baker [[id number]]  Attending Physician: Toy Baker [[id number]]  Certification:: I certify this patient will need inpatient services for at least 2 midnights  Estimated Length of Stay: 2            inpatient     I Expect 2 midnight stay secondary to severity of patient's current illness need for inpatient interventions justified by the following:  hemodynamic instability despite optimal treatment ( hypotension  )   Severe lab/radiological/exam abnormalities including:    Pulmonary embolus and extensive comorbidities including:   COPD/asthma  Morbid Obesity   CKD   That are currently affecting medical management.   I expect  patient to be hospitalized for 2 midnights requiring inpatient medical care.  Patient is at high risk for adverse outcome (such as loss of life or disability) if not treated.  Indication for inpatient stay as follows:   Hemodynamic instability despite maximal medical therapy,    severe pain requiring acute inpatient management,      New or worsening hypoxia   Need for I IV anticoagulation,      Level of care        progressive tele indefinitely please discontinue once patient no longer qualifies COVID-19 Labs    Lab Results  Component Value Date   [place] 06/09/2022     Precautions: admitted as   Covid Negative   Tudor Chandley 05/25/2022, 2:11 AM    [facility]     after 2 AM please page floor coverage PA If 7AM-7PM, please contact the day team taking care of the patient using [email or web address]   Patient was evaluated in  the context of the global COVID-19 pandemic, which necessitated consideration that the patient might be at risk for infection with the SARS-CoV-2 virus that  causes COVID-19. Institutional protocols and algorithms that pertain to the evaluation of patients at risk for COVID-19 are in a state of rapid change based on information released by regulatory bodies including the CDC and federal and state organizations. These policies and algorithms were followed during the patient's care.

## 2022-05-24 NOTE — ED Provider Notes (Signed)
Veronica Stone   CSN: [id number] Arrival date & time: 06/05/2022  1912     History  Chief Complaint  Patient presents with   Shortness of Breath    Veronica Stone is a 89 y.o. female history of pulmonary fibrosis and rheumatoid arthritis on Humira and Remicade and prednisone, here presenting with shortness of breath and chills.  Patient states that she has been feeling weaker for the last 2 weeks and worsening shortness of breath.  Patient was noted to have worsening chills and worsening shortness of breath.  EMS arrived and her oxygen was 82% on 4 L.  Patient was put on a nonrebreather.  The history is provided by the patient.       Home Medications Prior to Admission medications   Medication Sig Start Date End Date Taking? Authorizing Provider  amLODipine (NORVASC) 5 MG tablet Take 2.5 mg by mouth daily.    [provider]  cholecalciferol (VITAMIN D) 1000 units tablet Take 1,000 Units by mouth daily.    [provider]  furosemide (LASIX) 20 MG tablet Take 1 tablet by mouth Once daily as needed. For swelling 10/02/10   [provider]  irbesartan (AVAPRO) 300 MG tablet Take 1 tablet by mouth daily.    [provider]  loratadine (CLARITIN) 10 MG tablet Take 10 mg by mouth daily.    [provider]  Polyethyl Glycol-Propyl Glycol 0.4-0.3 % SOLN Apply 1 drop to eye daily. For dry eyes    [provider]  predniSONE (DELTASONE) 10 MG tablet TAKE 2 TABLETS BY MOUTH DAILY FOR 7 DAYS, THEN BACK DOWN TO 1 TABLET DAILY Patient taking differently: Take 10 mg by mouth daily with breakfast. 03/23/20   Veronica Noel, MD  predniSONE (DELTASONE) 10 MG tablet TAKE 1 TABLET(10 MG) BY MOUTH DAILY WITH BREAKFAST 05/11/21   Veronica Noel, MD  RiTUXimab (RITUXAN IV) Inject 2 application into the vein once. TAKES 2 DOSES IN ONE MONTH TIME, THEN DOESN'T TAKEN AGAIN FOR 6 MONTHS    [provider]      Allergies    Dilaudid [hydromorphone hcl], Penicillins, and Statins    Review of Systems   Review of Systems  Respiratory:  Positive for shortness of breath.   All other systems reviewed and are negative.   Physical Exam Updated Vital Signs BP 95/70   Pulse (!) 104   Temp 97.8 F (36.6 C) (Oral)   Resp (!) 25   Ht '5\' 3"'$  (1.6 m)   Wt 79.8 kg   SpO2 94%   BMI 31.16 kg/m  Physical Exam Vitals and nursing Stone reviewed.  Constitutional:      Comments: Chronically ill  HENT:     Head: Normocephalic.  Eyes:     Extraocular Movements: Extraocular movements intact.     Pupils: Pupils are equal, round, and reactive to light.  Cardiovascular:     Rate and Rhythm: Normal rate and regular rhythm.  Pulmonary:     Comments: Tachypneic and crackles bilateral bases. Musculoskeletal:        General: Normal range of motion.     Cervical back: Normal range of motion and neck supple.  Skin:    General: Skin is warm.     Capillary Refill: Capillary refill takes less than 2 seconds.  Neurological:     General: No focal deficit present.     Comments: Confused and disoriented  Psychiatric:  Mood and Affect: Mood normal.        Behavior: Behavior normal.     ED Results / Procedures / Treatments   Labs (all labs ordered are listed, but only abnormal results are displayed) Labs Reviewed  CBC WITH DIFFERENTIAL/PLATELET - Abnormal; Notable for the following components:      Result Value   WBC 18.0 (*)    RBC 5.50 (*)    Hemoglobin 15.6 (*)    HCT 48.8 (*)    Neutro Abs 14.0 (*)    Monocytes Absolute 2.3 (*)    Abs Immature Granulocytes 0.24 (*)    All other components within normal limits  COMPREHENSIVE METABOLIC PANEL - Abnormal; Notable for the following components:   Chloride 97 (*)    Glucose, Bld 165 (*)    Creatinine, Ser 1.43 (*)    Total Protein 5.9 (*)    Albumin 3.4 (*)    AST 60 (*)    GFR, Estimated 35 (*)    Anion gap 18 (*)    All  other components within normal limits  LACTIC ACID, PLASMA - Abnormal; Notable for the following components:   Lactic Acid, Venous 5.2 (*)    All other components within normal limits  I-STAT VENOUS BLOOD GAS, ED - Abnormal; Notable for the following components:   pCO2, Ven 40.8 (*)    Calcium, Ion 1.05 (*)    HCT 47.0 (*)    Hemoglobin 16.0 (*)    All other components within normal limits  RESP PANEL BY RT-PCR (RSV, FLU A&B, COVID)  RVPGX2  CULTURE, BLOOD (ROUTINE X 2)  CULTURE, BLOOD (ROUTINE X 2)  LACTIC ACID, PLASMA    EKG EKG Interpretation  Date/Time:  Tuesday May 24 2022 19:17:47 EST Ventricular Rate:  96 PR Interval:  149 QRS Duration: 135 QT Interval:  352 QTC Calculation: 445 R Axis:   -31 Text Interpretation: Sinus rhythm Atrial premature complex Right bundle branch block Anterolateral infarct, age indeterminate RBBB new since previous Confirmed by Wandra Arthurs ([id number]) on 06/09/2022 9:32:34 PM  Radiology DG Chest Port 1 View  Result Date: 05/25/2022 CLINICAL DATA:  SOB EXAM: PORTABLE CHEST 1 VIEW COMPARISON:  02/24/2021 FINDINGS: Cardiac silhouette is enlarged. Pulmonary interstitial changes consistent with fibrosis similar to the previous examination. No pneumothorax. Calcified aorta. No definite pleural effusions. IMPRESSION: Pulmonary fibrosis.  Prominent cardiac silhouette. Electronically Signed   By: Sammie Bench M.D.   On: 05/31/2022 19:44    Procedures Procedures    Medications Ordered in ED Medications  sodium chloride 0.9 % bolus 500 mL (has no administration in time range)  cefTRIAXone (ROCEPHIN) 1 g in sodium chloride 0.9 % 100 mL IVPB (has no administration in time range)  azithromycin (ZITHROMAX) 500 mg in sodium chloride 0.9 % 250 mL IVPB (has no administration in time range)  albuterol (PROVENTIL) (2.5 MG/3ML) 0.083% nebulizer solution 5 mg (5 mg Nebulization Given 06/15/2022 1956)  methylPREDNISolone sodium succinate (SOLU-MEDROL) 125 mg/2 mL  injection 125 mg (125 mg Intravenous Given 05/25/2022 1956)    ED Course/ Medical Decision Making/ A&P                           Medical Decision Making Veronica Stone is a 89 y.o. female here presenting with shortness of breath and hypoxia.  Patient is hypoxic to mid 80s on her home oxygen of 4 L.  Consider worsening pulmonary fibrosis versus superimposed infection.  She is on  chronic steroids so we will give first dose of Solu-Medrol.  Will also do sepsis workup with CBC and CMP and lactate and cultures and chest x-ray and COVID and flu test.  9:58 PM White blood cell count increased to 18.  Lactate is elevated to 5.2.  I wonder if she has superimposed pneumonia in addition to her pulmonary fibrosis.  Patient received IV steroids and I ordered Rocephin and azithromycin.  COVID and flu and RSV are negative.  Patient is placed on high flow nasal cannula.   Problems Addressed: Community acquired pneumonia, unspecified laterality: acute illness or injury Pulmonary fibrosis ([place]): acute illness or injury  Amount and/or Complexity of Data Reviewed Labs: ordered. Decision-making details documented in ED Course. Radiology: ordered and independent interpretation performed. Decision-making details documented in ED Course. ECG/medicine tests: ordered and independent interpretation performed. Decision-making details documented in ED Course.  Risk Prescription drug management. Decision regarding hospitalization.    Final Clinical Impression(s) / ED Diagnoses Final diagnoses:  None    Rx / DC Orders ED Discharge Orders     None         Drenda Freeze, MD 06/09/2022 2200

## 2022-05-24 NOTE — ED Triage Notes (Signed)
Patient arrives via EMS for shortness of breath and generalized weakness. Hx of pulmonary fibrosis. Weakness started 2 weeks ago along with a little bit of shortness of breath. Per daughter, patient started having N/V starting last night along with increased weakness and more short of breath. Patient wears 4L at baseline, EMS states that O2 on the 4L was 82%. Patient placed on NRB and O2 sat came up to 96% while on 12L on NRB. Patient more oriented during triage, able to state her name and birthday and where she is.

## 2022-05-24 NOTE — H&P (Incomplete)
Veronica Stone EPP:[id number] DOB: July 13, 1931 DOA: 05/31/2022     PCP: Deland Pretty, MD   Outpatient Specialists: * NONE CARDS: * Dr. NEphrology: *  Dr. NEurology *   Dr. Pulmonary *  Dr.  Oncology * Dr. Fabienne Bruns* Dr.  Sadie Haber, [facility]) No care team member to display Urology Dr. *  Patient arrived to ER on 05/17/2022 at 1912 Referred by Attending Drenda Freeze, MD   Patient coming from:    home Lives alone,   *** With family From facility ***  Chief Complaint:   Chief Complaint  Patient presents with  . Shortness of Breath    HPI: Veronica Stone is a 89 y.o. female with medical history significant of ***     Presented with   * Hx of pulmonary fibrosis and RA She has been more SOB for 2 weeks,had chills yesterday  At baseline on 4L  On arrival cyanotic sat in 80's Started on NRB now on 12 L HF She is on chronic prednisone and remicaid     Initial COVID TEST  NEGATIVE**** POSITIVE,  ***in house  PCR testing  Pending  Lab Results  Component Value Date   [place] NEGATIVE 05/28/2022     Regarding pertinent Chronic problems: ***  ****Hyperlipidemia - *on statins {statin:315258}  Lipid Panel  No results found for: "CHOL", "TRIG", "HDL", "CHOLHDL", "VLDL", "LDLCALC", "LDLDIRECT", "LABVLDL"  ***HTN on   ***chronic CHF diastolic/systolic/ combined - last echo***  *** CAD  - On Aspirin, statin, betablocker, Plavix                 - *followed by cardiology                - last cardiac cath  The ASCVD Risk score (Arnett DK, et al., 2019) failed to calculate for the following reasons:   The 2019 ASCVD risk score is only valid for ages 2 to 58    ***DM 2 - No results found for: "HGBA1C" ****on insulin, PO meds only, diet controlled  ***Hypothyroidism:  Lab Results  Component Value Date   TSH 1.256 ***Test methodology is 3rd generation TSH*** 09/26/2009   on synthroid  *** Morbid obesity-   BMI Readings from Last 1 Encounters:  05/21/2022 31.16 kg/m      *** Asthma -well *** controlled on home inhalers/ nebs f                        ***last no prior***admission  ***                       No ***history of intubation  *** COPD - not **followed by pulmonology *** not  on baseline oxygen  *L,    *** OSA -on nocturnal oxygen, *CPAP, *noncompliant with CPAP  *** Hx of CVA - *with/out residual deficits on Aspirin 81 mg, 325, Plavix  ***A. Fib -  - CHA2DS2 vas score **** CHA2DS2/VAS Stroke Risk Points      N/A >= 2 Points: High Risk  1 - 1.99 Points: Medium Risk  0 Points: Low Risk    Last Change: N/A      This score determines the patient's risk of having a stroke if the  patient has atrial fibrillation.      This score is not applicable to this patient. Components are not  calculated.     current  on anticoagulation with ****Coumadin  ***Xarelto,* Eliquis,  *** Not  on anticoagulation secondary to Risk of Falls, *** recurrent bleeding         -  Rate control:  Currently controlled with ***Toprolol,  *Metoprolol,* Diltiazem, *Coreg          - Rhythm control: *** amiodarone, *flecainide  ***Hx of DVT/PE on - anticoagulation with ****Coumadin  ***Xarelto,* Eliquis,   ***CKD stage III*- baseline Cr **** Estimated Creatinine Clearance: 27.2 mL/min (A) (by C-G formula based on SCr of 1.43 mg/dL (H)).  Lab Results  Component Value Date   CREATININE 1.43 (H) 05/27/2022   CREATININE 1.07 (H) 04/20/2022   CREATININE 1.03 (H) 04/06/2022     **** Liver disease Computed MELD 3.0 unavailable. Necessary lab results were not found in the last year. Computed MELD-Na unavailable. Necessary lab results were not found in the last year.    ***BPH - on Flomax, Proscar    *** Dementia - on Aricept** Nemenda  *** Chronic anemia - baseline hg Hemoglobin & Hematocrit  Recent Labs    04/20/22 0906 06/01/2022 1936 06/03/2022 1951  HGB 15.0 15.6* 16.0*     While in ER:       Ordered  CT HEAD *** NON acute  CXR - ***NON  acute  CTabd/pelvis - ***nonacute  CTA chest - ***nonacute, no PE, * no evidence of infiltrate  Following Medications were ordered in ER: Medications  sodium chloride 0.9 % bolus 500 mL (has no administration in time range)  cefTRIAXone (ROCEPHIN) 1 g in sodium chloride 0.9 % 100 mL IVPB (has no administration in time range)  azithromycin (ZITHROMAX) 500 mg in sodium chloride 0.9 % 250 mL IVPB (has no administration in time range)  albuterol (PROVENTIL) (2.5 MG/3ML) 0.083% nebulizer solution 5 mg (5 mg Nebulization Given 05/22/2022 1956)  methylPREDNISolone sodium succinate (SOLU-MEDROL) 125 mg/2 mL injection 125 mg (125 mg Intravenous Given 06/10/2022 1956)    _______________________________________________________ ER Provider Called:     DrMarland Kitchen  They Recommend admit to medicine *** Will see in AM  ***SEEN in ER   ED Triage Vitals  Enc Vitals Group     BP 05/19/2022 1915 115/87     Pulse Rate 05/21/2022 1915 99     Resp 05/27/2022 1915 (!) 32     Temp 06/07/2022 1915 97.8 F (36.6 C)     Temp Source 05/27/2022 1915 Oral     SpO2 05/25/2022 1915 97 %     Weight 06/13/2022 1917 175 lb 14.8 oz (79.8 kg)     Height 06/01/2022 1917 '5\' 3"'$  (1.6 m)     Head Circumference --      Peak Flow --      Pain Score 05/19/2022 1917 0     Pain Loc --      Pain Edu? --      Excl. in [place]? --   TMAX(24)@     _________________________________________ Significant initial  Findings: Abnormal Labs Reviewed  CBC WITH DIFFERENTIAL/PLATELET - Abnormal; Notable for the following components:      Result Value   WBC 18.0 (*)    RBC 5.50 (*)    Hemoglobin 15.6 (*)    HCT 48.8 (*)    Neutro Abs 14.0 (*)    Monocytes Absolute 2.3 (*)    Abs Immature Granulocytes 0.24 (*)    All other components within normal limits  COMPREHENSIVE METABOLIC PANEL - Abnormal; Notable for the following components:   Chloride 97 (*)    Glucose, Bld 165 (*)    Creatinine, Ser 1.43 (*)  Total Protein 5.9 (*)    Albumin 3.4 (*)    AST 60 (*)     GFR, Estimated 35 (*)    Anion gap 18 (*)    All other components within normal limits  LACTIC ACID, PLASMA - Abnormal; Notable for the following components:   Lactic Acid, Venous 5.2 (*)    All other components within normal limits  I-STAT VENOUS BLOOD GAS, ED - Abnormal; Notable for the following components:   pCO2, Ven 40.8 (*)    Calcium, Ion 1.05 (*)    HCT 47.0 (*)    Hemoglobin 16.0 (*)    All other components within normal limits     _________________________ Troponin ***ordered ECG: Ordered Personally reviewed and interpreted by me showing: HR : *** Rhythm: *NSR, Sinus tachycardia * A.fib. W RVR, RBBB, LBBB, Paced Ischemic changes*nonspecific changes, no evidence of ischemic changes QTC*   ____________________ This patient meets SIRS Criteria and may be septic. SIRS = Systemic Inflammatory Response Syndrome  Order a lactic acid level if needed AND/OR Initiate the sepsis protocol with the attached order set OR Click "Treating Associated Infection or Illness" if the patient is being treated for an infection that is a known cause of these abnormalities     The recent clinical data is shown below. Vitals:   05/20/2022 1915 06/08/2022 1917 05/28/2022 2100 06/15/2022 2130  BP: 115/87  95/70 110/77  Pulse: 99  (!) 104 (!) 105  Resp: (!) 32  (!) 25 (!) 31  Temp: 97.8 F (36.6 C)     TempSrc: Oral     SpO2: 97%  94% 95%  Weight:  79.8 kg    Height:  '5\' 3"'$  (1.6 m)          WBC     Component Value Date/Time   WBC 18.0 (H) 05/21/2022 1936   LYMPHSABS 1.3 06/08/2022 1936   MONOABS 2.3 (H) 05/25/2022 1936   EOSABS 0.0 06/06/2022 1936   BASOSABS 0.1 05/23/2022 1936        Lactic Acid, Venous    Component Value Date/Time   LATICACIDVEN 5.2 (HH) 05/31/2022 1915     Procalcitonin *** Ordered Lactic Acid, Venous    Component Value Date/Time   LATICACIDVEN 5.2 (HH) 06/13/2022 1915     Procalcitonin *** Ordered   UA *** no evidence of UTI  ***Pending  ***not ordered   Urine analysis:    Component Value Date/Time   COLORURINE AMBER BIOCHEMICALS MAY BE AFFECTED BY COLOR (A) 09/26/2009 1846   APPEARANCEUR CLOUDY (A) 09/26/2009 1846   LABSPEC 1.026 09/26/2009 1846   PHURINE 5.5 09/26/2009 1846   GLUCOSEU NEGATIVE 09/26/2009 1846   HGBUR NEGATIVE 09/26/2009 1846   BILIRUBINUR SMALL (A) 09/26/2009 1846   KETONESUR 15 (A) 09/26/2009 1846   PROTEINUR NEGATIVE 09/26/2009 1846   UROBILINOGEN 1.0 09/26/2009 1846   NITRITE NEGATIVE 09/26/2009 1846   LEUKOCYTESUR TRACE (A) 09/26/2009 1846    Results for orders placed or performed during the hospital encounter of 06/08/2022  Resp panel by RT-PCR (RSV, Flu A&B, Covid) Anterior Nasal Swab     Status: None   Collection Time: 06/15/2022  7:15 PM   Specimen: Anterior Nasal Swab  Result Value Ref Range Status   SARS Coronavirus 2 by RT PCR NEGATIVE NEGATIVE Final    Comment: (NOTE) SARS-CoV-2 target nucleic acids are NOT DETECTED.  The SARS-CoV-2 RNA is generally detectable in upper respiratory specimens during the acute phase of infection. The lowest concentration of SARS-CoV-2 viral  copies this assay can detect is 138 copies/mL. A negative result does not preclude SARS-Cov-2 infection and should not be used as the sole basis for treatment or other patient management decisions. A negative result may occur with  improper specimen collection/handling, submission of specimen other than nasopharyngeal swab, presence of viral mutation(s) within the areas targeted by this assay, and inadequate number of viral copies(<138 copies/mL). A negative result must be combined with clinical observations, patient history, and epidemiological information. The expected result is Negative.  Fact Sheet for Patients:  EntrepreneurPulse.com.au  Fact Sheet for Healthcare Providers:  IncredibleEmployment.be  This test is no t yet approved or cleared by the [place] FDA and   has been authorized for detection and/or diagnosis of SARS-CoV-2 by FDA under an Emergency Use Authorization (EUA). This EUA will remain  in effect (meaning this test can be used) for the duration of the COVID-19 declaration under Section 564(b)(1) of the Act, 21 U.S.C.section 360bbb-3(b)(1), unless the authorization is terminated  or revoked sooner.       Influenza A by PCR NEGATIVE NEGATIVE Final   Influenza B by PCR NEGATIVE NEGATIVE Final    Comment: (NOTE) The Xpert Xpress SARS-CoV-2/FLU/RSV plus assay is intended as an aid in the diagnosis of influenza from Nasopharyngeal swab specimens and should not be used as a sole basis for treatment. Nasal washings and aspirates are unacceptable for Xpert Xpress SARS-CoV-2/FLU/RSV testing.  Fact Sheet for Patients: EntrepreneurPulse.com.au  Fact Sheet for Healthcare Providers: IncredibleEmployment.be  This test is not yet approved or cleared by the [place] FDA and has been authorized for detection and/or diagnosis of SARS-CoV-2 by FDA under an Emergency Use Authorization (EUA). This EUA will remain in effect (meaning this test can be used) for the duration of the COVID-19 declaration under Section 564(b)(1) of the Act, 21 U.S.C. section 360bbb-3(b)(1), unless the authorization is terminated or revoked.     Resp Syncytial Virus by PCR NEGATIVE NEGATIVE Final    Comment: (NOTE) Fact Sheet for Patients: EntrepreneurPulse.com.au  Fact Sheet for Healthcare Providers: IncredibleEmployment.be  This test is not yet approved or cleared by the [place] FDA and has been authorized for detection and/or diagnosis of SARS-CoV-2 by FDA under an Emergency Use Authorization (EUA). This EUA will remain in effect (meaning this test can be used) for the duration of the COVID-19 declaration under Section 564(b)(1) of the Act, 21 U.S.C. section 360bbb-3(b)(1),  unless the authorization is terminated or revoked.  Performed at [facility], [place] [address] [place], [place] [address]      _______________________________________________ Hospitalist was called for admission for *** Pulmonary fibrosis ([place]) ***  Community acquired pneumonia, unspecified laterality ***    The following Work up has been ordered so far:  Orders Placed This Encounter  Procedures  . Blood culture (routine x 2)  . Resp panel by RT-PCR (RSV, Flu A&B, Covid) Anterior Nasal Swab  . DG Chest Port 1 View  . CT Angio Chest PE W and/or Wo Contrast  . CT ABDOMEN PELVIS W CONTRAST  . CBC with Differential  . Comprehensive metabolic panel  . Lactic acid, plasma  . Check Rectal Temperature  . Re-check Vital Signs  . Consult to hospitalist  . I-Stat venous blood gas, ([place] [facility], [facility], [facility])  . EKG 12-Lead  . EKG 12-Lead     OTHER Significant initial  Findings:  labs showing:    Recent Labs  Lab 06/14/2022 1936 06/06/2022 1951  NA 138 136  K  3.9 4.2  CO2 23  --   GLUCOSE 165*  --   BUN 17  --   CREATININE 1.43*  --   CALCIUM 9.1  --     Cr  * stable,  Up from baseline see below Lab Results  Component Value Date   CREATININE 1.43 (H) 05/25/2022   CREATININE 1.07 (H) 04/20/2022   CREATININE 1.03 (H) 04/06/2022    Recent Labs  Lab 06/13/2022 1936  AST 60*  ALT 27  ALKPHOS 66  BILITOT 1.0  PROT 5.9*  ALBUMIN 3.4*   Lab Results  Component Value Date   CALCIUM 9.1 06/08/2022          Plt: Lab Results  Component Value Date   PLT 278 06/10/2022       COVID-19 Labs  No results for input(s): "DDIMER", "FERRITIN", "LDH", "CRP" in the last 72 hours.  Lab Results  Component Value Date   [place] NEGATIVE 06/06/2022     Arterial ***Venous  Blood Gas result:  pH *** pCO2 ***; pO2 ***;     %O2 Sat ***.  ABG    Component Value Date/Time   HCO3 26.8 05/19/2022 1951   TCO2 28 06/04/2022 1951   O2SAT 67 05/18/2022 1951          Recent Labs  Lab 06/04/2022 1936 05/23/2022 1951  WBC 18.0*  --   NEUTROABS 14.0*  --   HGB 15.6* 16.0*  HCT 48.8* 47.0*  MCV 88.7  --   PLT 278  --     HG/HCT * stable,  Down *Up from baseline see below    Component Value Date/Time   HGB 16.0 (H) 06/01/2022 1951   HCT 47.0 (H) 06/10/2022 1951   MCV 88.7 06/04/2022 1936      No results for input(s): "LIPASE", "AMYLASE" in the last 168 hours. No results for input(s): "AMMONIA" in the last 168 hours.    Cardiac Panel (last 3 results) No results for input(s): "CKTOTAL", "CKMB", "TROPONINI", "RELINDX" in the last 72 hours.  .car BNP (last 3 results) No results for input(s): "BNP" in the last 8760 hours.    DM  labs:  HbA1C: No results for input(s): "HGBA1C" in the last 8760 hours.     CBG (last 3)  No results for input(s): "GLUCAP" in the last 72 hours.        Cultures:    Component Value Date/Time   SDES URINE, CLEAN CATCH 09/26/2009 1846   SPECREQUEST NONE 09/26/2009 1846   CULT  09/26/2009 1846    Multiple bacterial morphotypes present, none predominant. Suggest appropriate recollection if clinically indicated.   REPTSTATUS 09/28/2009 FINAL 09/26/2009 1846     Radiological Exams on Admission: DG Chest Port 1 View  Result Date: 05/30/2022 CLINICAL DATA:  SOB EXAM: PORTABLE CHEST 1 VIEW COMPARISON:  02/24/2021 FINDINGS: Cardiac silhouette is enlarged. Pulmonary interstitial changes consistent with fibrosis similar to the previous examination. No pneumothorax. Calcified aorta. No definite pleural effusions. IMPRESSION: Pulmonary fibrosis.  Prominent cardiac silhouette. Electronically Signed   By: Sammie Bench M.D.   On: 05/16/2022 19:44   _______________________________________________________________________________________________________ Latest  Blood pressure 110/77, pulse (!) 105, temperature 97.8 F (36.6 C), temperature source Oral, resp. rate (!) 31, height '5\' 3"'$  (1.6 m), weight 79.8 kg, SpO2 95 %.    Vitals  labs and radiology finding personally reviewed  Review of Systems:    Pertinent positives include: ***  Constitutional:  No weight loss, night sweats, Fevers, chills, fatigue, weight loss  HEENT:  No headaches, Difficulty swallowing,Tooth/dental problems,Sore throat,  No sneezing, itching, ear ache, nasal congestion, post nasal drip,  Cardio-vascular:  No chest pain, Orthopnea, PND, anasarca, dizziness, palpitations.no Bilateral lower extremity swelling  GI:  No heartburn, indigestion, abdominal pain, nausea, vomiting, diarrhea, change in bowel habits, loss of appetite, melena, blood in stool, hematemesis Resp:  no shortness of breath at rest. No dyspnea on exertion, No excess mucus, no productive cough, No non-productive cough, No coughing up of blood.No change in color of mucus.No wheezing. Skin:  no rash or lesions. No jaundice GU:  no dysuria, change in color of urine, no urgency or frequency. No straining to urinate.  No flank pain.  Musculoskeletal:  No joint pain or no joint swelling. No decreased range of motion. No back pain.  Psych:  No change in mood or affect. No depression or anxiety. No memory loss.  Neuro: no localizing neurological complaints, no tingling, no weakness, no double vision, no gait abnormality, no slurred speech, no confusion  All systems reviewed and apart from [place] all are negative _______________________________________________________________________________________________ Past Medical History:   Past Medical History:  Diagnosis Date  . Colon, diverticulosis Nov 2007  . Hyperlipemia   . Hypertension   . IBS (irritable bowel syndrome)   . Irregular heart rate   . Personal history of colonic polyps 04/10/2006   hyperplastic  . Pulmonary fibrosis ([place])   . Rheumatoid arthritis(714.0)       Past Surgical History:  Procedure Laterality Date  . ABDOMINAL HYSTERECTOMY    . APPENDECTOMY    . CHOLECYSTECTOMY    . CYSTECTOMY     left  breast    Social History:  Ambulatory *** independently cane, walker  wheelchair bound, bed bound     reports that she quit smoking about 34 years ago. Her smoking use included cigarettes. She has a 30.00 pack-year smoking history. She has never used smokeless tobacco. She reports that she does not drink alcohol and does not use drugs.     Family History: *** Family History  Problem Relation Age of Onset  . Heart disease Brother   . Heart disease Sister   . Liver disease Daughter   . Diabetes Brother   . Rheum arthritis Mother    ______________________________________________________________________________________________ Allergies: Allergies  Allergen Reactions  . Dilaudid [Hydromorphone Hcl] Nausea And Vomiting  . Penicillins Itching and Rash  . Statins Itching     Prior to Admission medications   Medication Sig Start Date End Date Taking? Authorizing Provider  cholecalciferol (VITAMIN D) 1000 units tablet Take 1,000 Units by mouth daily.   Yes [provider]  furosemide (LASIX) 20 MG tablet Take 1 tablet by mouth daily as needed for edema. 10/02/10  Yes [provider]  HYDROcodone-acetaminophen (NORCO/VICODIN) 5-325 MG tablet Take 1 tablet by mouth in the morning, at noon, and at bedtime.   Yes [provider]  irbesartan (AVAPRO) 300 MG tablet Take 300 mg by mouth daily.   Yes [provider]  loratadine (CLARITIN) 10 MG tablet Take 10 mg by mouth daily as needed for allergies.   Yes [provider]  Polyethyl Glycol-Propyl Glycol 0.4-0.3 % SOLN Place 1 drop into both eyes every other day. For dry eyes   Yes [provider]  predniSONE (DELTASONE) 10 MG tablet TAKE 1 TABLET(10 MG) BY MOUTH DAILY WITH BREAKFAST Patient taking differently: Take 10 mg by mouth daily with breakfast. 05/11/21  Yes Rigoberto Noel, MD  RiTUXimab (RITUXAN IV)  Inject 2 application into the vein once. TAKES 2 DOSES IN ONE MONTH TIME, THEN  DOESN'T TAKEN AGAIN FOR 6 MONTHS   Yes [provider]    ___________________________________________________________________________________________________ Physical Exam:    05/23/2022    9:30 PM 06/08/2022    9:00 PM 05/27/2022    7:17 PM  Vitals with BMI  Height   '5\' 3"'$   Weight   175 lbs 15 oz  BMI   10.62  Systolic 694 95   Diastolic 77 70   Pulse 854 104      1. General:  in No ***Acute distress***increased work of breathing ***complaining of severe pain****agitated * Chronically ill *well *cachectic *toxic acutely ill -appearing 2. Psychological: Alert and *** Oriented 3. Head/ENT:   Moist *** Dry Mucous Membranes                          Head Non traumatic, neck supple                          Normal *** Poor Dentition 4. SKIN: normal *** decreased Skin turgor,  Skin clean Dry and intact no rash 5. Heart: Regular rate and rhythm no*** Murmur, no Rub or gallop 6. Lungs: ***Clear to auscultation bilaterally, no wheezes or crackles   7. Abdomen: Soft, ***non-tender, Non distended *** obese ***bowel sounds present 8. Lower extremities: no clubbing, cyanosis, no ***edema 9. Neurologically Grossly intact, moving all 4 extremities equally *** strength 5 out of 5 in all 4 extremities cranial nerves II through XII intact 10. MSK: Normal range of motion    Chart has been reviewed  ______________________________________________________________________________________________  Assessment/Plan  ***  Admitted for *** Pulmonary fibrosis (HCC) ***  Community acquired pneumonia, unspecified laterality ***    Present on Admission: **None**     No problem-specific Assessment & Plan notes found for this encounter.    Other plan as per orders.  DVT prophylaxis:  SCD *** Lovenox       Code Status:    Code Status: Not on file FULL CODE *** DNR/DNI ***comfort care as per patient ***family  I had personally discussed CODE STATUS with patient and family* I had  spent *min discussing goals of care and CODE STATUS    Family Communication:   Family not at  Bedside  plan of care was discussed on the phone with *** Son, Daughter, Wife, Husband, Sister, Brother , father, mother  Disposition Plan:   *** likely will need placement for rehabilitation                          Back to current facility when stable                            To home once workup is complete and patient is stable  ***Following barriers for discharge:                            Electrolytes corrected                               Anemia corrected  Pain controlled with PO medications                               Afebrile, white count improving able to transition to PO antibiotics                             Will need to be able to tolerate PO                            Will likely need home health, home O2, set up                           Will need consultants to evaluate patient prior to discharge  ****EXPECT DC tomorrow                    ***Would benefit from PT/OT eval prior to DC  Ordered                   Swallow eval - SLP ordered                   Diabetes care coordinator                   Transition of care consulted                   Nutrition    consulted                  Wound care  consulted                   Palliative care    consulted                   Behavioral health  consulted                    Consults called: ***    Admission status:  ED Disposition     ED Disposition  Admit   Condition  --   Comment  The patient appears reasonably stabilized for admission considering the current resources, flow, and capabilities available in the ED at this time, and I doubt any other [facility] requiring further screening and/or treatment in the ED prior to admission is  present.           Obs***  ***  inpatient     I Expect 2 midnight stay secondary to severity of patient's current illness need for inpatient interventions  justified by the following: ***hemodynamic instability despite optimal treatment (tachycardia *hypotension * tachypnea *hypoxia, hypercapnia) * Severe lab/radiological/exam abnormalities including:     and extensive comorbidities including: *substance abuse  *Chronic pain *DM2  * CHF * CAD  * COPD/asthma *Morbid Obesity * CKD *dementia *liver disease *history of stroke with residual deficits *  malignancy, * sickle cell disease  History of amputation Chronic anticoagulation  That are currently affecting medical management.   I expect  patient to be hospitalized for 2 midnights requiring inpatient medical care.  Patient is at high risk for adverse outcome (such as loss of life or disability) if not treated.  Indication for inpatient stay as follows:  Severe change from baseline regarding mental status Hemodynamic instability despite maximal medical therapy,  ongoing suicidal ideations,  severe pain requiring acute inpatient management,  inability to maintain oral hydration   persistent chest pain despite medical management Need for operative/procedural  intervention New or worsening hypoxia   Need for IV antibiotics, IV fluids, IV rate controling medications, IV antihypertensives, IV pain medications, IV anticoagulation, need for biPAP    Level of care   *** tele  For 12H 24H     medical floor       progressive tele indefinitely please discontinue once patient no longer qualifies COVID-19 Labs    Lab Results  Component Value Date   [place] NEGATIVE 06/14/2022     Precautions: admitted as *** Covid Negative  ***asymptomatic screening protocol****PUI *** covid positive No active isolations ***If Covid PCR is negative  - please DC precautions - would need additional investigation given very high risk for false native test result    Critical***  Patient is critically ill due to  hemodynamic instability * respiratory failure *severe sepsis* ongoing chest pain*   They are at high risk for life/limb threatening clinical deterioration requiring frequent reassessment and modifications of care.  Services provided include examination of the patient, review of relevant ancillary tests, prescription of lifesaving therapies, review of medications and prophylactic therapy.  Total critical care time excluding separately billable procedures: 60*  Minutes.    Veronica Stone 05/21/2022, 10:35 PM ***  [facility]     after 2 AM please page floor coverage PA If 7AM-7PM, please contact the day team taking care of the patient using [email or web address]   Patient was evaluated in the context of the global COVID-19 pandemic, which necessitated consideration that the patient might be at risk for infection with the SARS-CoV-2 virus that causes COVID-19. Institutional protocols and algorithms that pertain to the evaluation of patients at risk for COVID-19 are in a state of rapid change based on information released by regulatory bodies including the CDC and federal and state organizations. These policies and algorithms were followed during the patient's care.

## 2022-05-25 ENCOUNTER — Inpatient Hospital Stay (HOSPITAL_COMMUNITY): Payer: Medicare Other

## 2022-05-25 DIAGNOSIS — I959 Hypotension, unspecified: Secondary | ICD-10-CM | POA: Diagnosis present

## 2022-05-25 DIAGNOSIS — R7989 Other specified abnormal findings of blood chemistry: Secondary | ICD-10-CM | POA: Diagnosis present

## 2022-05-25 DIAGNOSIS — J189 Pneumonia, unspecified organism: Secondary | ICD-10-CM | POA: Diagnosis present

## 2022-05-25 DIAGNOSIS — J9621 Acute and chronic respiratory failure with hypoxia: Secondary | ICD-10-CM | POA: Diagnosis not present

## 2022-05-25 DIAGNOSIS — I2699 Other pulmonary embolism without acute cor pulmonale: Secondary | ICD-10-CM | POA: Diagnosis present

## 2022-05-25 DIAGNOSIS — J8410 Pulmonary fibrosis, unspecified: Secondary | ICD-10-CM | POA: Diagnosis not present

## 2022-05-25 DIAGNOSIS — I2602 Saddle embolus of pulmonary artery with acute cor pulmonale: Secondary | ICD-10-CM | POA: Diagnosis not present

## 2022-05-25 DIAGNOSIS — Z8739 Personal history of other diseases of the musculoskeletal system and connective tissue: Secondary | ICD-10-CM

## 2022-05-25 DIAGNOSIS — E860 Dehydration: Secondary | ICD-10-CM | POA: Diagnosis present

## 2022-05-25 DIAGNOSIS — I10 Essential (primary) hypertension: Secondary | ICD-10-CM | POA: Diagnosis not present

## 2022-05-25 LAB — RESPIRATORY PANEL BY PCR

## 2022-05-25 LAB — CBC
HCT: 39.8 % (ref 36.0–46.0)
HCT: 43.1 % (ref 36.0–46.0)
Hemoglobin: 13 g/dL (ref 12.0–15.0)
Hemoglobin: 14.2 g/dL (ref 12.0–15.0)
MCH: 28.8 pg (ref 26.0–34.0)
MCH: 28.7 pg (ref 26.0–34.0)
MCHC: 32.7 g/dL (ref 30.0–36.0)
MCHC: 32.9 g/dL (ref 30.0–36.0)
MCV: 88.2 fL (ref 80.0–100.0)
MCV: 87.1 fL (ref 80.0–100.0)
Platelets: 196 10*3/uL (ref 150–400)
Platelets: 242 10*3/uL (ref 150–400)
RBC: 4.51 MIL/uL (ref 3.87–5.11)
RBC: 4.95 MIL/uL (ref 3.87–5.11)
RDW: 13.5 % (ref 11.5–15.5)
RDW: 13.5 % (ref 11.5–15.5)
WBC: 16.1 10*3/uL — ABNORMAL HIGH (ref 4.0–10.5)
WBC: 18.8 10*3/uL — ABNORMAL HIGH (ref 4.0–10.5)
nRBC: 0 % (ref 0.0–0.2)
nRBC: 0 % (ref 0.0–0.2)

## 2022-05-25 LAB — COMPREHENSIVE METABOLIC PANEL
ALT: 23 U/L (ref 0–44)
AST: 39 U/L (ref 15–41)
Albumin: 2.9 g/dL — ABNORMAL LOW (ref 3.5–5.0)
Alkaline Phosphatase: 60 U/L (ref 38–126)
Anion gap: 10 (ref 5–15)
BUN: 16 mg/dL (ref 8–23)
CO2: 24 mmol/L (ref 22–32)
Calcium: 8 mg/dL — ABNORMAL LOW (ref 8.9–10.3)
Chloride: 106 mmol/L (ref 98–111)
Creatinine, Ser: 1.11 mg/dL — ABNORMAL HIGH (ref 0.44–1.00)
GFR, Estimated: 48 mL/min — ABNORMAL LOW (ref >60)
Glucose, Bld: 122 mg/dL — ABNORMAL HIGH (ref 70–99)
Potassium: 3.4 mmol/L — ABNORMAL LOW (ref 3.5–5.1)
Sodium: 140 mmol/L (ref 135–145)
Total Bilirubin: 0.9 mg/dL (ref 0.3–1.2)
Total Protein: 5.4 g/dL — ABNORMAL LOW (ref 6.5–8.1)

## 2022-05-25 LAB — LACTIC ACID, PLASMA
Lactic Acid, Venous: 2.1 mmol/L (ref 0.5–1.9)
Lactic Acid, Venous: 1.9 mmol/L (ref 0.5–1.9)

## 2022-05-25 LAB — ECHOCARDIOGRAM COMPLETE
Area-P 1/2: 3.99 cm2
Height: 63 in
S' Lateral: 1.8 cm
Weight: 2814.83 oz

## 2022-05-25 LAB — TROPONIN I (HIGH SENSITIVITY)
Troponin I (High Sensitivity): 1858 ng/L (ref <18)
Troponin I (High Sensitivity): 1633 ng/L (ref <18)
Troponin I (High Sensitivity): 1503 ng/L (ref <18)

## 2022-05-25 LAB — PROTIME-INR
INR: 1.3 — ABNORMAL HIGH (ref 0.8–1.2)
Prothrombin Time: 15.7 seconds — ABNORMAL HIGH (ref 11.4–15.2)

## 2022-05-25 LAB — CK: Total CK: 136 U/L (ref 38–234)

## 2022-05-25 LAB — TSH: TSH: 0.513 u[IU]/mL (ref 0.350–4.500)

## 2022-05-25 LAB — HEPARIN LEVEL (UNFRACTIONATED)
Heparin Unfractionated: 0.72 IU/mL — ABNORMAL HIGH (ref 0.30–0.70)
Heparin Unfractionated: 0.75 IU/mL — ABNORMAL HIGH (ref 0.30–0.70)

## 2022-05-25 LAB — PROCALCITONIN: Procalcitonin: 3.58 ng/mL

## 2022-05-25 LAB — BRAIN NATRIURETIC PEPTIDE: B Natriuretic Peptide: 1180.6 pg/mL — ABNORMAL HIGH (ref 0.0–100.0)

## 2022-05-25 LAB — PHOSPHORUS
Phosphorus: 3.6 mg/dL (ref 2.5–4.6)
Phosphorus: 3.5 mg/dL (ref 2.5–4.6)

## 2022-05-25 LAB — MAGNESIUM
Magnesium: 2.1 mg/dL (ref 1.7–2.4)
Magnesium: 2.3 mg/dL (ref 1.7–2.4)

## 2022-05-25 LAB — APTT: aPTT: 29 seconds (ref 24–36)

## 2022-05-25 LAB — PREALBUMIN: Prealbumin: 12 mg/dL — ABNORMAL LOW (ref 18–38)

## 2022-05-25 MED ORDER — SODIUM CHLORIDE 0.9 % IV SOLN
2.0000 g | INTRAVENOUS | Status: AC
  Administered 2022-05-25 – 2022-05-28 (×4): 2 g via INTRAVENOUS
  Filled 2022-05-25 (×4): qty 20

## 2022-05-25 MED ORDER — SODIUM CHLORIDE 0.9 % IV SOLN: INTRAVENOUS | Status: AC

## 2022-05-25 MED ORDER — HEPARIN BOLUS VIA INFUSION
3000.0000 [IU] | Freq: Once | INTRAVENOUS | Status: AC
  Administered 2022-05-25: 3000 [IU] via INTRAVENOUS
  Filled 2022-05-25: qty 3000

## 2022-05-25 MED ORDER — LORATADINE 10 MG PO TABS: 10.0000 mg | ORAL_TABLET | Freq: Every day | ORAL | Status: DC | PRN

## 2022-05-25 MED ORDER — PANTOPRAZOLE SODIUM 40 MG PO TBEC
40.0000 mg | DELAYED_RELEASE_TABLET | Freq: Every day | ORAL | Status: DC
  Administered 2022-05-25 – 2022-05-26 (×2): 40 mg via ORAL
  Filled 2022-05-25 (×2): qty 1

## 2022-05-25 MED ORDER — VANCOMYCIN HCL IN DEXTROSE 1-5 GM/200ML-% IV SOLN: 1000.0000 mg | INTRAVENOUS | Status: DC

## 2022-05-25 MED ORDER — SODIUM CHLORIDE 0.9 % IV SOLN: 2.0000 g | INTRAVENOUS | Status: DC

## 2022-05-25 MED ORDER — ACETAMINOPHEN 650 MG RE SUPP: 650.0000 mg | Freq: Four times a day (QID) | RECTAL | Status: DC | PRN

## 2022-05-25 MED ORDER — HYDROCORTISONE SOD SUC (PF) 100 MG IJ SOLR
100.0000 mg | Freq: Three times a day (TID) | INTRAMUSCULAR | Status: DC
  Administered 2022-05-25 – 2022-05-26 (×5): 100 mg via INTRAVENOUS
  Filled 2022-05-25 (×5): qty 2

## 2022-05-25 MED ORDER — ACETAMINOPHEN 325 MG PO TABS
650.0000 mg | ORAL_TABLET | Freq: Four times a day (QID) | ORAL | Status: DC | PRN
  Administered 2022-05-30: 650 mg via ORAL
  Filled 2022-05-25: qty 2

## 2022-05-25 MED ORDER — SODIUM CHLORIDE 0.9 % IV SOLN
500.0000 mg | INTRAVENOUS | Status: AC
  Administered 2022-05-26 – 2022-05-30 (×5): 500 mg via INTRAVENOUS
  Filled 2022-05-25 (×5): qty 5

## 2022-05-25 MED ORDER — HEPARIN (PORCINE) 25000 UT/250ML-% IV SOLN
1100.0000 [IU]/h | INTRAVENOUS | Status: DC
  Administered 2022-05-25: 1100 [IU]/h via INTRAVENOUS
  Administered 2022-05-25: 1200 [IU]/h via INTRAVENOUS
  Administered 2022-05-27: 950 [IU]/h via INTRAVENOUS
  Filled 2022-05-25 (×3): qty 250

## 2022-05-25 MED ORDER — PREDNISONE 10 MG PO TABS
10.0000 mg | ORAL_TABLET | Freq: Every day | ORAL | Status: DC
  Administered 2022-05-26 – 2022-05-31 (×4): 10 mg via ORAL
  Filled 2022-05-25 (×7): qty 1

## 2022-05-25 NOTE — Assessment & Plan Note (Signed)
Not on statin 

## 2022-05-25 NOTE — Assessment & Plan Note (Addendum)
Admit to progressive May need transfer to ICU Pt now hypotensive  Starting IV bolus and PCCM consult given Acute large PE  Initiate heparin drip  Would likely benefit from case manager consult for long term anticoagulation Hold home blood pressure medications avoid hypotension Cycle cardiac enzymes Order echogram and lower extremity Dopplers  Most likely risk factors for hypercoagulable state being  hormonal therapy history of rheumatoid arthritis pulmonary hypertension

## 2022-05-25 NOTE — Progress Notes (Signed)
  Echocardiogram 2D Echocardiogram has been performed.  Veronica Stone 05/25/2022, 11:25 AM

## 2022-05-25 NOTE — ED Notes (Signed)
Patient back from vascular study at this time.

## 2022-05-25 NOTE — Consult Note (Signed)
NAME:  Veronica Stone, MRN:  [id number], DOB:  11-06-1931, LOS: 1 ADMISSION DATE:  05/23/2022, CONSULTATION DATE:  05/25/22 REFERRING MD:  Roel Cluck CHIEF COMPLAINT:  Dyspnea, Hypotension   History of Present Illness:  Veronica Stone is a 89 y.o. female who has a PMH as below including but not limited to IPF UIP pattern, on chronic 4L O2 and followed by Dr. Elsworth Soho as outpatient. She presented to [facility] ED 1/9 with worsening dyspnea x 2 weeks that progressively worsened. Began to have chills on day prior to presentation along with ongoing cough (somewhat chronic). Denies fevers, chest pain, N/V/D, abd pain, myalgias, exposures to known sick contacts. Has had decreased PO intake due to weakness and just not feeling well. Daughter wanted her to come to hospital sooner but she denied.  She had CTA chest that demonstrated bilateral upper lobe PE with RV/LV 3.2 along with known patchy GGOs bilaterally. She required 10L HFNC to maintain sats high 90s.  She was admitted by [facility] but then while in ED, had hypotension with SBP down to 80s. MAP's remained in high 60s to 70s throughout. Due to her drop in BP and O2 requirement, PCCM called in consultation.  She tells me that she is a DNR/DNI and that she is set on that; however, her daughter would never go for it. She requests that we do not call her daughter at this hour. I asked her how we should list her for this admission and she states she feels to leave it as is (full code) until she talks to her daughter personally. When I told her that this is an important thing to get right in case we ran into an emergency, she said "you don't know my daughter and it's quarter to two in the morning so dont call".  Pertinent  Medical History:  has HYPERLIPIDEMIA; Essential hypertension; AAA; GASTRIC ULCER, ACUTE, HEMORRHAGE; ACUT GASTR ULCER W/O MENTION HEMORR PERF/OBST; GASTRIC ULCER; DIVERTICULOSIS, COLON; Rheumatoid arthritis ([place]); DYSGEUSIA; WEIGHT LOSS-ABNORMAL; Pulmonary  emphysema with fibrosis of lung ([place]); Chronic bronchitis ([place]); Acute on chronic respiratory failure with hypoxia ([place]); Physical deconditioning; Severe sepsis ([place]); Acute pulmonary embolism ([place]); History of rheumatoid arthritis; Dehydration; Elevated lactic acid level; CAP (community acquired pneumonia); and Hypotension on their problem list.  Significant Hospital Events: Including procedures, antibiotic start and stop dates in addition to other pertinent events   1/10 admit.  Interim History / Subjective:  On 10L HFNC. Comfortable, states gets dyspneic with activity and although this is baseline for her, it has been worse over past 1-2 weeks. No fevers, some chills. Denies chest pain, lightheadedness.  Objective:  Blood pressure (!) 85/64, pulse 97, temperature 98 F (36.7 C), temperature source Oral, resp. rate (!) 29, height '5\' 3"'$  (1.6 m), weight 79.8 kg, SpO2 96 %.        Intake/Output Summary (Last 24 hours) at 05/25/2022 0153 Last data filed at 05/25/2022 0138 Gross per 24 hour  Intake 1050 ml  Output --  Net 1050 ml   Filed Weights   06/06/2022 1917  Weight: 79.8 kg    Examination: General: Elderly female, resting in bed, in NAD. Neuro: A&O x 3, no deficits. HEENT: [place]/AT. Sclerae anicteric. EOMI. Cardiovascular: RRR, no M/R/G.  Lungs: Respirations even and unlabored.  Faint crackles bilaterally. Abdomen: BS x 4, soft, NT/ND.  Musculoskeletal: No gross deformities, no edema.  Skin: Intact, warm, no rashes.  Labs/imaging personally reviewed:  CTA chest 1/10 > bilateral upper lobe PE with RV/LV 3.2 along with  known patchy GGOs bilaterally. Echo 1/10 >  LE duplex 1/10 >   Assessment & Plan:   Bilateral upper lobe PE - while RV/LV is elevated at 3.2, suspect most of this is due to underlying lung disease as her clot burden is not that large. Doubt that her PE is contributing much to her soft pressures. - Continue heparin. - Assess echo, LE duplex.  Hx IPF with UIP  pattern. Acute on chronic hypoxic respiratory failure - 2/2 above + PE. Possible CAP. - Continue supplemental O2 as needed to maintain SpO2 > 90%. - Reasonable to continue with abx for now.  Possible sepsis - hard to interpret her elevated lactate in setting IPF/hypoxia/PE vs underlying infection. - Trend. - Empiric abx reasonable for now. - Follow cultures.  Rest per primary team. PCCM will follow for pulmonary needs.  Best practice (evaluated daily):  Per [facility].  Labs   CBC: Recent Labs  Lab 05/30/2022 1936 06/10/2022 1951  WBC 18.0*  --   NEUTROABS 14.0*  --   HGB 15.6* 16.0*  HCT 48.8* 47.0*  MCV 88.7  --   PLT 278  --     Basic Metabolic Panel: Recent Labs  Lab 06/12/2022 1936 05/28/2022 1951  NA 138 136  K 3.9 4.2  CL 97*  --   CO2 23  --   GLUCOSE 165*  --   BUN 17  --   CREATININE 1.43*  --   CALCIUM 9.1  --    GFR: Estimated Creatinine Clearance: 27.2 mL/min (A) (by C-G formula based on SCr of 1.43 mg/dL (H)). Recent Labs  Lab 06/15/2022 1915 05/18/2022 1936 05/22/2022 2115 05/25/22 0009  WBC  --  18.0*  --   --   LATICACIDVEN 5.2*  --  2.6* 2.1*    Liver Function Tests: Recent Labs  Lab 05/23/2022 1936  AST 60*  ALT 27  ALKPHOS 66  BILITOT 1.0  PROT 5.9*  ALBUMIN 3.4*   No results for input(s): "LIPASE", "AMYLASE" in the last 168 hours. No results for input(s): "AMMONIA" in the last 168 hours.  ABG    Component Value Date/Time   HCO3 26.8 06/12/2022 1951   TCO2 28 06/12/2022 1951   O2SAT 67 06/08/2022 1951     Coagulation Profile: Recent Labs  Lab 05/25/22 0009  INR 1.3*    Cardiac Enzymes: No results for input(s): "CKTOTAL", "CKMB", "CKMBINDEX", "TROPONINI" in the last 168 hours.  HbA1C: No results found for: "HGBA1C"  CBG: No results for input(s): "GLUCAP" in the last 168 hours.  Review of Systems:   All negative; except for those that are bolded, which indicate positives.  Constitutional: weight loss, weight gain, night  sweats, fevers, chills, fatigue, weakness.  HEENT: headaches, sore throat, sneezing, nasal congestion, post nasal drip, difficulty swallowing, tooth/dental problems, visual complaints, visual changes, ear aches. Neuro: difficulty with speech, weakness, numbness, ataxia. CV:  chest pain, orthopnea, PND, swelling in lower extremities, dizziness, palpitations, syncope.  Resp: cough, hemoptysis, dyspnea, wheezing. GI: heartburn, indigestion, abdominal pain, nausea, vomiting, diarrhea, constipation, change in bowel habits, loss of appetite, hematemesis, melena, hematochezia.  GU: dysuria, change in color of urine, urgency or frequency, flank pain, hematuria. MSK: joint pain or swelling, decreased range of motion. Psych: change in mood or affect, depression, anxiety, suicidal ideations, homicidal ideations. Skin: rash, itching, bruising.   Past Medical History:  She,  has a past medical history of Colon, diverticulosis (Nov 2007), Hyperlipemia, Hypertension, IBS (irritable bowel syndrome), Irregular heart rate, Personal  history of colonic polyps (04/10/2006), Pulmonary fibrosis ([place]), and Rheumatoid arthritis(714.0).   Surgical History:   Past Surgical History:  Procedure Laterality Date   ABDOMINAL HYSTERECTOMY     APPENDECTOMY     CHOLECYSTECTOMY     CYSTECTOMY     left breast     Social History:   reports that she quit smoking about 34 years ago. Her smoking use included cigarettes. She has a 30.00 pack-year smoking history. She has never used smokeless tobacco. She reports that she does not drink alcohol and does not use drugs.   Family History:  Her family history includes Diabetes in her brother; Heart disease in her brother and sister; Liver disease in her daughter; Rheum arthritis in her mother.   Allergies Allergies  Allergen Reactions   Dilaudid [Hydromorphone Hcl] Nausea And Vomiting   Penicillins Itching and Rash   Statins Itching     Home Medications  Prior to  Admission medications   Medication Sig Start Date End Date Taking? Authorizing Provider  cholecalciferol (VITAMIN D) 1000 units tablet Take 1,000 Units by mouth daily.   Yes [provider]  furosemide (LASIX) 20 MG tablet Take 1 tablet by mouth daily as needed for edema. 10/02/10  Yes [provider]  HYDROcodone-acetaminophen (NORCO/VICODIN) 5-325 MG tablet Take 1 tablet by mouth in the morning, at noon, and at bedtime.   Yes [provider]  irbesartan (AVAPRO) 300 MG tablet Take 300 mg by mouth daily.   Yes [provider]  loratadine (CLARITIN) 10 MG tablet Take 10 mg by mouth daily as needed for allergies.   Yes [provider]  Polyethyl Glycol-Propyl Glycol 0.4-0.3 % SOLN Place 1 drop into both eyes every other day. For dry eyes   Yes [provider]  predniSONE (DELTASONE) 10 MG tablet TAKE 1 TABLET(10 MG) BY MOUTH DAILY WITH BREAKFAST Patient taking differently: Take 10 mg by mouth daily with breakfast. 05/11/21  Yes Rigoberto Noel, MD  RiTUXimab (RITUXAN IV) Inject 2 application into the vein once. TAKES 2 DOSES IN ONE MONTH TIME, THEN DOESN'T TAKEN AGAIN FOR 6 MONTHS   Yes [provider]     Critical care time: N/A.   Montey Hora, [place] For pager details, please see AMION or use Epic chat  After 1900, please call [facility] for cross coverage needs 05/25/2022, 1:53 AM

## 2022-05-25 NOTE — Progress Notes (Signed)
Lower extremity venous bilateral study completed.   Please see CV Proc for preliminary results.   Annabella Elford, RDMS, RVT  

## 2022-05-25 NOTE — Progress Notes (Signed)
ANTICOAGULATION CONSULT NOTE - Follow-Up Consult  Pharmacy Consult for heparin and vancomycin/cefepime Indication:  PE and sepsis  Allergies  Allergen Reactions   Dilaudid [Hydromorphone Hcl] Nausea And Vomiting   Penicillins Itching and Rash   Statins Itching    Patient Measurements: Height: '5\' 3"'$  (160 cm) Weight: 79.8 kg (175 lb 14.8 oz) IBW/kg (Calculated) : 52.4 Heparin Dosing Weight: 70kg  Vital Signs: Temp: 98 F (36.7 C) (01/10 0732) Temp Source: Oral (01/10 0330) BP: 103/69 (01/10 0900) Pulse Rate: 83 (01/10 0900)  Labs: Recent Labs    06/14/2022 1936 06/02/2022 1951 05/25/22 0009 05/25/22 0109 05/25/22 0240 05/25/22 0630 05/25/22 1036  HGB 15.6* 16.0*  --   --   --  13.0  --   HCT 48.8* 47.0*  --   --   --  39.8  --   PLT 278  --   --   --   --  196  --   APTT  --   --  29  --   --   --   --   LABPROT  --   --  15.7*  --   --   --   --   INR  --   --  1.3*  --   --   --   --   HEPARINUNFRC  --   --   --   --   --   --  0.72*  CREATININE 1.43*  --   --   --   --   --   --   CKTOTAL  --   --   --  136  --   --   --   TROPONINIHS  --   --   --  9,628* 1,633* 1,503*  --      Estimated Creatinine Clearance: 27.2 mL/min (A) (by C-G formula based on SCr of 1.43 mg/dL (H)).   Medical History: Past Medical History:  Diagnosis Date   Colon, diverticulosis Nov 2007   Hyperlipemia    Hypertension    IBS (irritable bowel syndrome)    Irregular heart rate    Personal history of colonic polyps 04/10/2006   hyperplastic   Pulmonary fibrosis ([facility])    Rheumatoid arthritis(714.0)     Assessment: 89 yo female c/o worsening SOB and generalized weakness, started on ABX for PNA/sepsis then sent to CT which reveals bilateral PE with evidence of R heart strain. Pharmacy consulted to dose and manage heparin infusion for treatment of PE.   Heparin level 0.72, slightly supratherapeutic Current heparin infusion rate: 1200 units/hr  Hgb 13.0 and Plt 196 - dropped due to  dilutional effect No s/sx of bleeding  Goal of Therapy:  Heparin level 0.3-0.7 units/ml Vancomycin AUC 400-550 Monitor platelets by anticoagulation protocol: Yes   Plan:  Decrease heparin infusion rate to 1100 units/hr. Check heparin level in 8 hours Monitor daily CBC, heparin levels, and for s/sx of bleeding  Luisa Hart, PharmD, BCPS Clinical Pharmacist 05/25/2022 11:25 AM   Please refer to [facility] for pharmacy phone number

## 2022-05-25 NOTE — Assessment & Plan Note (Signed)
Allow permissive hypertension for tonight 

## 2022-05-25 NOTE — ED Notes (Signed)
[place], MD at bedside to assess patient and go over admission plans. At this time, patient is hypotensive. Verbal order for 1L NS bolus. CCM contacted to come see patient due to new hypotension.

## 2022-05-25 NOTE — ED Notes (Signed)
ED TO INPATIENT HANDOFF REPORT  ED Nurse Name and Phone #: Rolanda Lundborg [phone]  S Name/Age/Gender Veronica Stone 89 y.o. female Room/Bed: 010C/010C  Code Status   Code Status: Full Code  Home/SNF/Other Home Patient oriented to: self, place, time, and situation Is this baseline? Yes   Triage Complete: Triage complete  Chief Complaint Severe sepsis ([place]) [A41.9, R65.20]  Triage Note Patient arrives via EMS for shortness of breath and generalized weakness. Hx of pulmonary fibrosis. Weakness started 2 weeks ago along with a little bit of shortness of breath. Per daughter, patient started having N/V starting last night along with increased weakness and more short of breath. Patient wears 4L at baseline, EMS states that O2 on the 4L was 82%. Patient placed on NRB and O2 sat came up to 96% while on 12L on NRB. Patient more oriented during triage, able to state her name and birthday and where she is.    Allergies Allergies  Allergen Reactions   Dilaudid [Hydromorphone Hcl] Nausea And Vomiting   Penicillins Itching and Rash   Statins Itching    Level of Care/Admitting Diagnosis ED Disposition     ED Disposition  Admit   Condition  --   [place]: [place] [[id number]]  Level of Care: Progressive [102]  Admit to Progressive based on following criteria: MULTISYSTEM THREATS such as stable sepsis, metabolic/electrolyte imbalance with or without encephalopathy that is responding to early treatment.  May admit patient to [facility] or [facility] if equivalent level of care is available:: No  Covid Evaluation: Confirmed COVID Negative  Diagnosis: Severe sepsis [facility]) [[id number]]  Admitting Physician: Toy Baker [[id number]]  Attending Physician: Toy Baker [[id number]]  Certification:: I certify this patient will need inpatient services for at least 2 midnights  Estimated Length of Stay: 2          B Medical/Surgery History Past Medical  History:  Diagnosis Date   Colon, diverticulosis Nov 2007   Hyperlipemia    Hypertension    IBS (irritable bowel syndrome)    Irregular heart rate    Personal history of colonic polyps 04/10/2006   hyperplastic   Pulmonary fibrosis ([place])    Rheumatoid arthritis(714.0)    Past Surgical History:  Procedure Laterality Date   ABDOMINAL HYSTERECTOMY     APPENDECTOMY     CHOLECYSTECTOMY     CYSTECTOMY     left breast     A IV Location/Drains/Wounds Patient Lines/Drains/Airways Status     Active Line/Drains/Airways     Name Placement date Placement time Site Days   Peripheral IV 05/20/2022 20 G Left Wrist 05/27/2022  1941  Wrist  1   Peripheral IV 05/25/22 20 G Right Hand 05/25/22  0010  Hand  less than 1   Peripheral IV 05/25/22 20 G Left Antecubital 05/25/22  0144  Antecubital  less than 1   External Urinary Catheter 05/25/22  0143  --  less than 1            Intake/Output Last 24 hours  Intake/Output Summary (Last 24 hours) at 05/25/2022 1249 Last data filed at 05/25/2022 0345 Gross per 24 hour  Intake 1350 ml  Output --  Net 1350 ml    Labs/Imaging Results for orders placed or performed during the hospital encounter of 05/18/2022 (from the past 48 hour(s))  Lactic acid, plasma     Status: Abnormal   Collection Time: 06/07/2022  7:15 PM  Result Value Ref Range   Lactic Acid,  Venous 5.2 (HH) 0.5 - 1.9 mmol/L    Comment: CRITICAL RESULT CALLED TO, READ BACK BY AND VERIFIED WITH W HICKS,RN 2044 05/16/2022 WBOND Performed at [facility], [place] [address], [place], [place] [address]   Resp panel by RT-PCR (RSV, Flu A&B, Covid) Anterior Nasal Swab     Status: None   Collection Time: 06/15/2022  7:15 PM   Specimen: Anterior Nasal Swab  Result Value Ref Range   SARS Coronavirus 2 by RT PCR NEGATIVE NEGATIVE    Comment: (NOTE) SARS-CoV-2 target nucleic acids are NOT DETECTED.  The SARS-CoV-2 RNA is generally detectable in upper respiratory specimens during the acute  phase of infection. The lowest concentration of SARS-CoV-2 viral copies this assay can detect is 138 copies/mL. A negative result does not preclude SARS-Cov-2 infection and should not be used as the sole basis for treatment or other patient management decisions. A negative result may occur with  improper specimen collection/handling, submission of specimen other than nasopharyngeal swab, presence of viral mutation(s) within the areas targeted by this assay, and inadequate number of viral copies(<138 copies/mL). A negative result must be combined with clinical observations, patient history, and epidemiological information. The expected result is Negative.  Fact Sheet for Patients:  EntrepreneurPulse.com.au  Fact Sheet for Healthcare Providers:  IncredibleEmployment.be  This test is no t yet approved or cleared by the [place] FDA and  has been authorized for detection and/or diagnosis of SARS-CoV-2 by FDA under an Emergency Use Authorization (EUA). This EUA will remain  in effect (meaning this test can be used) for the duration of the COVID-19 declaration under Section 564(b)(1) of the Act, 21 U.S.C.section 360bbb-3(b)(1), unless the authorization is terminated  or revoked sooner.       Influenza A by PCR NEGATIVE NEGATIVE   Influenza B by PCR NEGATIVE NEGATIVE    Comment: (NOTE) The Xpert Xpress SARS-CoV-2/FLU/RSV plus assay is intended as an aid in the diagnosis of influenza from Nasopharyngeal swab specimens and should not be used as a sole basis for treatment. Nasal washings and aspirates are unacceptable for Xpert Xpress SARS-CoV-2/FLU/RSV testing.  Fact Sheet for Patients: EntrepreneurPulse.com.au  Fact Sheet for Healthcare Providers: IncredibleEmployment.be  This test is not yet approved or cleared by the [place] FDA and has been authorized for detection and/or diagnosis of SARS-CoV-2  by FDA under an Emergency Use Authorization (EUA). This EUA will remain in effect (meaning this test can be used) for the duration of the COVID-19 declaration under Section 564(b)(1) of the Act, 21 U.S.C. section 360bbb-3(b)(1), unless the authorization is terminated or revoked.     Resp Syncytial Virus by PCR NEGATIVE NEGATIVE    Comment: (NOTE) Fact Sheet for Patients: EntrepreneurPulse.com.au  Fact Sheet for Healthcare Providers: IncredibleEmployment.be  This test is not yet approved or cleared by the [place] FDA and has been authorized for detection and/or diagnosis of SARS-CoV-2 by FDA under an Emergency Use Authorization (EUA). This EUA will remain in effect (meaning this test can be used) for the duration of the COVID-19 declaration under Section 564(b)(1) of the Act, 21 U.S.C. section 360bbb-3(b)(1), unless the authorization is terminated or revoked.  Performed at [facility], [place] [address] [place], [place] [address]   CBC with Differential     Status: Abnormal   Collection Time: 05/20/2022  7:36 PM  Result Value Ref Range   WBC 18.0 (H) 4.0 - 10.5 K/uL   RBC 5.50 (H) 3.87 - 5.11 MIL/uL   Hemoglobin  15.6 (H) 12.0 - 15.0 g/dL   HCT 48.8 (H) 36.0 - 46.0 %   MCV 88.7 80.0 - 100.0 fL   MCH 28.4 26.0 - 34.0 pg   MCHC 32.0 30.0 - 36.0 g/dL   RDW 13.3 11.5 - 15.5 %   Platelets 278 150 - 400 K/uL   nRBC 0.0 0.0 - 0.2 %   Neutrophils Relative % 78 %   Neutro Abs 14.0 (H) 1.7 - 7.7 K/uL   Lymphocytes Relative 7 %   Lymphs Abs 1.3 0.7 - 4.0 K/uL   Monocytes Relative 13 %   Monocytes Absolute 2.3 (H) 0.1 - 1.0 K/uL   Eosinophils Relative 0 %   Eosinophils Absolute 0.0 0.0 - 0.5 K/uL   Basophils Relative 1 %   Basophils Absolute 0.1 0.0 - 0.1 K/uL   Immature Granulocytes 1 %   Abs Immature Granulocytes 0.24 (H) 0.00 - 0.07 K/uL    Comment: Performed at [facility] [address] [place], [place] [address]   Comprehensive metabolic panel     Status: Abnormal   Collection Time: 06/10/2022  7:36 PM  Result Value Ref Range   Sodium 138 135 - 145 mmol/L   Potassium 3.9 3.5 - 5.1 mmol/L   Chloride 97 (L) 98 - 111 mmol/L   CO2 23 22 - 32 mmol/L   Glucose, Bld 165 (H) 70 - 99 mg/dL    Comment: Glucose reference range applies only to samples taken after fasting for at least 8 hours.   BUN 17 8 - 23 mg/dL   Creatinine, Ser 1.43 (H) 0.44 - 1.00 mg/dL   Calcium 9.1 8.9 - 10.3 mg/dL   Total Protein 5.9 (L) 6.5 - 8.1 g/dL   Albumin 3.4 (L) 3.5 - 5.0 g/dL   AST 60 (H) 15 - 41 U/L   ALT 27 0 - 44 U/L   Alkaline Phosphatase 66 38 - 126 U/L   Total Bilirubin 1.0 0.3 - 1.2 mg/dL   GFR, Estimated 35 (L) >60 mL/min    Comment: (NOTE) Calculated using the CKD-EPI Creatinine Equation (2021)    Anion gap 18 (H) 5 - 15    Comment: Performed at [facility], [place] [address], [place], [place] [address]  Blood culture (routine x 2)     Status: None (Preliminary result)   Collection Time: 06/06/2022  7:36 PM   Specimen: BLOOD  Result Value Ref Range   Specimen Description BLOOD RIGHT ANTECUBITAL    Special Requests      BOTTLES DRAWN AEROBIC AND ANAEROBIC Blood Culture adequate volume   Culture      NO GROWTH < 24 HOURS Performed at [facility], [place] [address], [place], [place] [address]    Report Status PENDING   I-Stat venous blood gas, ([place] [facility], [facility], [facility])     Status: Abnormal   Collection Time: 05/27/2022  7:51 PM  Result Value Ref Range   pH, Ven 7.426 7.25 - 7.43   pCO2, Ven 40.8 (L) 44 - 60 mmHg   pO2, Ven 34 32 - 45 mmHg   Bicarbonate 26.8 20.0 - 28.0 mmol/L   TCO2 28 22 - 32 mmol/L   O2 Saturation 67 %   Acid-Base Excess 2.0 0.0 - 2.0 mmol/L   Sodium 136 135 - 145 mmol/L   Potassium 4.2 3.5 - 5.1 mmol/L   Calcium, Ion 1.05 (L) 1.15 - 1.40 mmol/L   HCT 47.0 (H) 36.0 - 46.0 %  Hemoglobin 16.0 (H) 12.0 - 15.0 g/dL   Sample type VENOUS    Comment NOTIFIED PHYSICIAN   Blood culture  (routine x 2)     Status: None (Preliminary result)   Collection Time: 05/18/2022  7:53 PM   Specimen: BLOOD  Result Value Ref Range   Specimen Description BLOOD LEFT ANTECUBITAL    Special Requests      BOTTLES DRAWN AEROBIC AND ANAEROBIC Blood Culture results may not be optimal due to an inadequate volume of blood received in culture bottles   Culture      NO GROWTH < 24 HOURS Performed at [facility] [address], [place], [place] [address]    Report Status PENDING   Lactic acid, plasma     Status: Abnormal   Collection Time: 06/03/2022  9:15 PM  Result Value Ref Range   Lactic Acid, Venous 2.6 (HH) 0.5 - 1.9 mmol/L    Comment: CRITICAL VALUE NOTED. VALUE IS CONSISTENT WITH PREVIOUSLY REPORTED/CALLED VALUE Performed at [facility], [place] [address], [place], [place] [address]   Lactic acid, plasma     Status: Abnormal   Collection Time: 05/25/22 12:09 AM  Result Value Ref Range   Lactic Acid, Venous 2.1 (HH) 0.5 - 1.9 mmol/L    Comment: CRITICAL VALUE NOTED. VALUE IS CONSISTENT WITH PREVIOUSLY REPORTED/CALLED VALUE Performed at [facility], [place] [address], [place], [place] [address]   Protime-INR     Status: Abnormal   Collection Time: 05/25/22 12:09 AM  Result Value Ref Range   Prothrombin Time 15.7 (H) 11.4 - 15.2 seconds   INR 1.3 (H) 0.8 - 1.2    Comment: (NOTE) INR goal varies based on device and disease states. Performed at [facility], [place] [address] [place], [place] [address]   APTT     Status: None   Collection Time: 05/25/22 12:09 AM  Result Value Ref Range   aPTT 29 24 - 36 seconds    Comment: Performed at [facility] [address] [place], [place] [address]  TSH     Status: None   Collection Time: 05/25/22  1:09 AM  Result Value Ref Range   TSH 0.513 0.350 - 4.500 uIU/mL    Comment: Performed by a 3rd Generation assay with a functional sensitivity of <=0.01 uIU/mL. Performed at [facility], [place] [address],  [place], [place] [address]   CK     Status: None   Collection Time: 05/25/22  1:09 AM  Result Value Ref Range   Total CK 136 38 - 234 U/L    Comment: Performed at [facility], [place] [address], [place], [place] [address]  Magnesium     Status: None   Collection Time: 05/25/22  1:09 AM  Result Value Ref Range   Magnesium 2.1 1.7 - 2.4 mg/dL    Comment: Performed at [facility] [address], [place],  [address]  Procalcitonin     Status: None   Collection Time: 05/25/22  1:09 AM  Result Value Ref Range   Procalcitonin 3.58 ng/mL    Comment:        Interpretation: PCT > 2 ng/mL: Systemic infection (sepsis) is likely, unless other causes are known. (NOTE)       Sepsis PCT Algorithm           Lower Respiratory Tract  Infection PCT Algorithm    ----------------------------     ----------------------------         PCT < 0.25 ng/mL                PCT < 0.10 ng/mL          Strongly encourage             Strongly discourage   discontinuation of antibiotics    initiation of antibiotics    ----------------------------     -----------------------------       PCT 0.25 - 0.50 ng/mL            PCT 0.10 - 0.25 ng/mL               OR       >80% decrease in PCT            Discourage initiation of                                            antibiotics      Encourage discontinuation           of antibiotics    ----------------------------     -----------------------------         PCT >= 0.50 ng/mL              PCT 0.26 - 0.50 ng/mL               AND       <80% decrease in PCT              Encourage initiation of                                             antibiotics       Encourage continuation           of antibiotics    ----------------------------     -----------------------------        PCT >= 0.50 ng/mL                  PCT > 0.50 ng/mL               AND         increase in PCT                  Strongly encourage                                       initiation of antibiotics    Strongly encourage escalation           of antibiotics                                     -----------------------------                                           PCT <= 0.25 ng/mL  OR                                        > 80% decrease in PCT                                      Discontinue / Do not initiate                                             antibiotics  Performed at [facility], [place] [address], [place], [place] [address]   Phosphorus     Status: None   Collection Time: 05/25/22  1:09 AM  Result Value Ref Range   Phosphorus 3.6 2.5 - 4.6 mg/dL    Comment: Performed at [facility] [address], [place], [place] [address]  Troponin I (High Sensitivity)     Status: Abnormal   Collection Time: 05/25/22  1:09 AM  Result Value Ref Range   Troponin I (High Sensitivity) 1,858 (HH) <18 ng/L    Comment: CRITICAL RESULT CALLED TO, READ BACK BY AND VERIFIED WITH Ashley Akin RN 05/25/22 0207 Wiliam Ke (NOTE) Elevated high sensitivity troponin I (hsTnI) values and significant  changes across serial measurements may suggest ACS but many other  chronic and acute conditions are known to elevate hsTnI results.  Refer to the "Links" section for chest pain algorithms and additional  guidance. Performed at [facility], [place] [address] [place], [place] [address]   Respiratory (~20 pathogens) panel by PCR     Status: None   Collection Time: 05/25/22  1:12 AM   Specimen: Nasopharyngeal Swab; Respiratory  Result Value Ref Range   Adenovirus NOT DETECTED NOT DETECTED   Coronavirus 229E NOT DETECTED NOT DETECTED    Comment: (NOTE) The Coronavirus on the Respiratory Panel, DOES NOT test for the novel  Coronavirus (2019 nCoV)    Coronavirus HKU1 NOT DETECTED NOT DETECTED   Coronavirus NL63 NOT DETECTED NOT DETECTED   Coronavirus OC43 NOT DETECTED NOT DETECTED    Metapneumovirus NOT DETECTED NOT DETECTED   Rhinovirus / Enterovirus NOT DETECTED NOT DETECTED   Influenza A NOT DETECTED NOT DETECTED   Influenza B NOT DETECTED NOT DETECTED   Parainfluenza Virus 1 NOT DETECTED NOT DETECTED   Parainfluenza Virus 2 NOT DETECTED NOT DETECTED   Parainfluenza Virus 3 NOT DETECTED NOT DETECTED   Parainfluenza Virus 4 NOT DETECTED NOT DETECTED   Respiratory Syncytial Virus NOT DETECTED NOT DETECTED   Bordetella pertussis NOT DETECTED NOT DETECTED   Bordetella Parapertussis NOT DETECTED NOT DETECTED   Chlamydophila pneumoniae NOT DETECTED NOT DETECTED   Mycoplasma pneumoniae NOT DETECTED NOT DETECTED    Comment: Performed at [facility], [place] [address] [place], [place] [address]  Lactic acid, plasma     Status: None   Collection Time: 05/25/22  2:20 AM  Result Value Ref Range   Lactic Acid, Venous 1.9 0.5 - 1.9 mmol/L    Comment: Performed at [facility] [address] [place],  [address]  Prealbumin     Status: Abnormal   Collection Time: 05/25/22  2:40 AM  Result Value Ref Range  Prealbumin 12 (L) 18 - 38 mg/dL    Comment: Performed at [facility] [address], [place], [place] [address]  Troponin I (High Sensitivity)     Status: Abnormal   Collection Time: 05/25/22  2:40 AM  Result Value Ref Range   Troponin I (High Sensitivity) 1,633 (HH) <18 ng/L    Comment: CRITICAL VALUE NOTED. VALUE IS CONSISTENT WITH PREVIOUSLY REPORTED/CALLED VALUE (NOTE) Elevated high sensitivity troponin I (hsTnI) values and significant  changes across serial measurements may suggest ACS but many other  chronic and acute conditions are known to elevate hsTnI results.  Refer to the "Links" section for chest pain algorithms and additional  guidance. Performed at [facility], [place] [address], [place], [place] [address]   Brain natriuretic peptide     Status: Abnormal   Collection Time: 05/25/22  6:30 AM  Result Value Ref Range   B  Natriuretic Peptide 1,180.6 (H) 0.0 - 100.0 pg/mL    Comment: Performed at [facility] [address], [place], [place] [address]  CBC     Status: Abnormal   Collection Time: 05/25/22  6:30 AM  Result Value Ref Range   WBC 16.1 (H) 4.0 - 10.5 K/uL   RBC 4.51 3.87 - 5.11 MIL/uL   Hemoglobin 13.0 12.0 - 15.0 g/dL   HCT 39.8 36.0 - 46.0 %   MCV 88.2 80.0 - 100.0 fL   MCH 28.8 26.0 - 34.0 pg   MCHC 32.7 30.0 - 36.0 g/dL   RDW 13.5 11.5 - 15.5 %   Platelets 196 150 - 400 K/uL   nRBC 0.0 0.0 - 0.2 %    Comment: Performed at [facility], [place] [address], [place], [place] [address]  Troponin I (High Sensitivity)     Status: Abnormal   Collection Time: 05/25/22  6:30 AM  Result Value Ref Range   Troponin I (High Sensitivity) 1,503 (HH) <18 ng/L    Comment: CRITICAL VALUE NOTED. VALUE IS CONSISTENT WITH PREVIOUSLY REPORTED/CALLED VALUE (NOTE) Elevated high sensitivity troponin I (hsTnI) values and significant  changes across serial measurements may suggest ACS but many other  chronic and acute conditions are known to elevate hsTnI results.  Refer to the "Links" section for chest pain algorithms and additional  guidance. Performed at [facility], [place] [address] [place], [place] [address]   Heparin level (unfractionated)     Status: Abnormal   Collection Time: 05/25/22 10:36 AM  Result Value Ref Range   Heparin Unfractionated 0.72 (H) 0.30 - 0.70 IU/mL    Comment: (NOTE) The clinical reportable range upper limit is being lowered to >1.10 to align with the FDA approved guidance for the current laboratory assay.  If heparin results are below expected values, and patient dosage has  been confirmed, suggest follow up testing of antithrombin III levels. Performed at [facility], [place] [address] [place], [place] [address]    VAS [place] LOWER EXTREMITY VENOUS (DVT)  Result Date: 05/25/2022  Lower Venous DVT Study Patient Name:  ALTHA SWEITZER  Date of Exam:    05/25/2022 Medical Rec #: [id number]         Accession #:    [id number] Date of Birth: [birth year]         Patient Gender: F Patient Age:   10 years Exam Location:  [facility] Procedure:      VAS [place] LOWER EXTREMITY VENOUS (DVT) Referring Phys: Nyoka Lint DOUTOVA --------------------------------------------------------------------------------  Indications: Pulmonary embolism.  Anticoagulation: Heparin. Comparison Study: No prior studies. Performing Technologist: Darlin Coco RDMS, RVT  Examination Guidelines: A complete evaluation includes B-mode imaging, spectral Doppler, color Doppler, and power Doppler as needed of all accessible portions of each vessel. Bilateral testing is considered an integral part of a complete examination. Limited examinations for reoccurring indications may be performed as noted. The reflux portion of the exam is performed with the patient in reverse Trendelenburg.  +---------+---------------+---------+-----------+----------+--------------+ RIGHT    CompressibilityPhasicitySpontaneityPropertiesThrombus Aging +---------+---------------+---------+-----------+----------+--------------+ CFV      Full           Yes      Yes                                 +---------+---------------+---------+-----------+----------+--------------+ SFJ      Full                                                        +---------+---------------+---------+-----------+----------+--------------+ FV Prox  Full                                                        +---------+---------------+---------+-----------+----------+--------------+ FV Mid   Full                                                        +---------+---------------+---------+-----------+----------+--------------+ FV DistalFull                                                        +---------+---------------+---------+-----------+----------+--------------+ PFV      Full                                                         +---------+---------------+---------+-----------+----------+--------------+ POP      Full           Yes      Yes                                 +---------+---------------+---------+-----------+----------+--------------+ PTV      Full                                                        +---------+---------------+---------+-----------+----------+--------------+ PERO     Full                                                        +---------+---------------+---------+-----------+----------+--------------+  Gastroc  Partial        Yes      Yes                  Acute          +---------+---------------+---------+-----------+----------+--------------+   +---------+---------------+---------+-----------+----------+--------------+ LEFT     CompressibilityPhasicitySpontaneityPropertiesThrombus Aging +---------+---------------+---------+-----------+----------+--------------+ CFV      Full           Yes      Yes                                 +---------+---------------+---------+-----------+----------+--------------+ SFJ      Full                                                        +---------+---------------+---------+-----------+----------+--------------+ FV Prox  Full                                                        +---------+---------------+---------+-----------+----------+--------------+ FV Mid   Full                                                        +---------+---------------+---------+-----------+----------+--------------+ FV DistalFull                                                        +---------+---------------+---------+-----------+----------+--------------+ PFV      Full                                                        +---------+---------------+---------+-----------+----------+--------------+ POP      Full           Yes      Yes                                  +---------+---------------+---------+-----------+----------+--------------+ PTV      Full                                                        +---------+---------------+---------+-----------+----------+--------------+ PERO     None           No       No                   Acute          +---------+---------------+---------+-----------+----------+--------------+ Gastroc  Full                                                        +---------+---------------+---------+-----------+----------+--------------+  Summary: RIGHT: - Findings consistent with acute deep vein thrombosis involving the right gastrocnemius veins. - No cystic structure found in the popliteal fossa.  LEFT: - Findings consistent with acute deep vein thrombosis involving the left peroneal veins. - No cystic structure found in the popliteal fossa.  *See table(s) above for measurements and observations.    Preliminary    CT Angio Chest PE W and/or Wo Contrast  Addendum Date: 05/23/2022   ADDENDUM REPORT: 06/09/2022 23:49 ADDENDUM: These results were called by telephone at the time of interpretation on 06/09/2022 at 11:49 pm to provider Dr. Roel Cluck, who verbally acknowledged these results. Electronically Signed   By: Ronney Asters M.D.   On: 05/28/2022 23:49   Result Date: 06/09/2022 CLINICAL DATA:  Shortness of breath and chills.  Abdominal pain. EXAM: CT ANGIOGRAPHY CHEST CT ABDOMEN AND PELVIS WITH CONTRAST TECHNIQUE: Multidetector CT imaging of the chest was performed using the standard protocol during bolus administration of intravenous contrast. Multiplanar CT image reconstructions and MIPs were obtained to evaluate the vascular anatomy. Multidetector CT imaging of the abdomen and pelvis was performed using the standard protocol during bolus administration of intravenous contrast. RADIATION DOSE REDUCTION: This exam was performed according to the departmental dose-optimization program which includes automated exposure  control, adjustment of the mA and/or kV according to patient size and/or use of iterative reconstruction technique. CONTRAST:  63m OMNIPAQUE IOHEXOL 350 MG/ML SOLN COMPARISON:  CT chest 06/06/2016.  Lumbar spine x-ray 12/09/2011. FINDINGS: CTA CHEST FINDINGS Cardiovascular: Heart is mildly enlarged. Aorta is normal in size. There are atherosclerotic calcifications of the aorta. There is adequate opacification of the pulmonary arteries to the segmental level. There are lobar and segmental left upper lobe and right upper lobe pulmonary emboli. Mediastinum/Nodes: No enlarged mediastinal, hilar, or axillary lymph nodes. Thyroid gland, trachea, and esophagus demonstrate no significant findings. Lungs/Pleura: Fibrotic and interstitial opacities are again seen throughout both lungs, increased compared to 2018. There are new patchy ground-glass opacities in the bilateral upper lobes. There is no pleural effusion or pneumothorax. Trachea and central airways are patent. Musculoskeletal: No chest wall abnormality. No acute or significant osseous findings. Review of the MIP images confirms the above findings. CT ABDOMEN and PELVIS FINDINGS Hepatobiliary: No focal liver abnormality is seen. Status post cholecystectomy. No biliary dilatation. Pancreas: Unremarkable. No pancreatic ductal dilatation or surrounding inflammatory changes. Spleen: Normal in size without focal abnormality. Adrenals/Urinary Tract: There are subcentimeter cysts in both kidneys. Otherwise, the kidneys, adrenal glands and bladder are within normal limits. Stomach/Bowel: There is a small hiatal hernia. Stomach is otherwise within normal limits. Appendix is not seen. No evidence of bowel wall thickening, distention, or inflammatory changes. There is sigmoid colon diverticulosis. Vascular/Lymphatic: Aortic atherosclerosis. No enlarged abdominal or pelvic lymph nodes. Reproductive: Status post hysterectomy. No adnexal masses. Other: No abdominal wall hernia or  abnormality. No abdominopelvic ascites. Musculoskeletal: L1 compression deformity has mildly progressed compared to 2013. Review of the MIP images confirms the above findings. IMPRESSION: 1. Bilateral upper lobe lobar and segmental pulmonary emboli. Positive for acute PE with CTevidence of right heart strain (RV/LV Ratio = 3.2) consistent with at least submassive (intermediate risk) PE. The presence of right heart strain has been associated with an increased risk of morbidity and mortality. 2. New patchy ground-glass opacities in the bilateral upper lobes, likely infectious/inflammatory. 3. Findings compatible with chronic interstitial lung disease, increased compared to 2018. 4. No acute localizing process in the abdomen or pelvis. 5. Sigmoid colon diverticulosis. Aortic Atherosclerosis (ICD10-I70.0).  Electronically Signed: By: Ronney Asters M.D. On: 05/28/2022 23:40   CT ABDOMEN PELVIS W CONTRAST  Addendum Date: 06/07/2022   ADDENDUM REPORT: 06/12/2022 23:49 ADDENDUM: These results were called by telephone at the time of interpretation on 05/28/2022 at 11:49 pm to provider Dr. Roel Cluck, who verbally acknowledged these results. Electronically Signed   By: Ronney Asters M.D.   On: 06/02/2022 23:49   Result Date: 05/28/2022 CLINICAL DATA:  Shortness of breath and chills.  Abdominal pain. EXAM: CT ANGIOGRAPHY CHEST CT ABDOMEN AND PELVIS WITH CONTRAST TECHNIQUE: Multidetector CT imaging of the chest was performed using the standard protocol during bolus administration of intravenous contrast. Multiplanar CT image reconstructions and MIPs were obtained to evaluate the vascular anatomy. Multidetector CT imaging of the abdomen and pelvis was performed using the standard protocol during bolus administration of intravenous contrast. RADIATION DOSE REDUCTION: This exam was performed according to the departmental dose-optimization program which includes automated exposure control, adjustment of the mA and/or kV according to  patient size and/or use of iterative reconstruction technique. CONTRAST:  45m OMNIPAQUE IOHEXOL 350 MG/ML SOLN COMPARISON:  CT chest 06/06/2016.  Lumbar spine x-ray 12/09/2011. FINDINGS: CTA CHEST FINDINGS Cardiovascular: Heart is mildly enlarged. Aorta is normal in size. There are atherosclerotic calcifications of the aorta. There is adequate opacification of the pulmonary arteries to the segmental level. There are lobar and segmental left upper lobe and right upper lobe pulmonary emboli. Mediastinum/Nodes: No enlarged mediastinal, hilar, or axillary lymph nodes. Thyroid gland, trachea, and esophagus demonstrate no significant findings. Lungs/Pleura: Fibrotic and interstitial opacities are again seen throughout both lungs, increased compared to 2018. There are new patchy ground-glass opacities in the bilateral upper lobes. There is no pleural effusion or pneumothorax. Trachea and central airways are patent. Musculoskeletal: No chest wall abnormality. No acute or significant osseous findings. Review of the MIP images confirms the above findings. CT ABDOMEN and PELVIS FINDINGS Hepatobiliary: No focal liver abnormality is seen. Status post cholecystectomy. No biliary dilatation. Pancreas: Unremarkable. No pancreatic ductal dilatation or surrounding inflammatory changes. Spleen: Normal in size without focal abnormality. Adrenals/Urinary Tract: There are subcentimeter cysts in both kidneys. Otherwise, the kidneys, adrenal glands and bladder are within normal limits. Stomach/Bowel: There is a small hiatal hernia. Stomach is otherwise within normal limits. Appendix is not seen. No evidence of bowel wall thickening, distention, or inflammatory changes. There is sigmoid colon diverticulosis. Vascular/Lymphatic: Aortic atherosclerosis. No enlarged abdominal or pelvic lymph nodes. Reproductive: Status post hysterectomy. No adnexal masses. Other: No abdominal wall hernia or abnormality. No abdominopelvic ascites.  Musculoskeletal: L1 compression deformity has mildly progressed compared to 2013. Review of the MIP images confirms the above findings. IMPRESSION: 1. Bilateral upper lobe lobar and segmental pulmonary emboli. Positive for acute PE with CTevidence of right heart strain (RV/LV Ratio = 3.2) consistent with at least submassive (intermediate risk) PE. The presence of right heart strain has been associated with an increased risk of morbidity and mortality. 2. New patchy ground-glass opacities in the bilateral upper lobes, likely infectious/inflammatory. 3. Findings compatible with chronic interstitial lung disease, increased compared to 2018. 4. No acute localizing process in the abdomen or pelvis. 5. Sigmoid colon diverticulosis. Aortic Atherosclerosis (ICD10-I70.0). Electronically Signed: By: ARonney AstersM.D. On: 06/12/2022 23:40   DG Chest Port 1 View  Result Date: 06/04/2022 CLINICAL DATA:  SOB EXAM: PORTABLE CHEST 1 VIEW COMPARISON:  02/24/2021 FINDINGS: Cardiac silhouette is enlarged. Pulmonary interstitial changes consistent with fibrosis similar to the previous examination. No pneumothorax. Calcified aorta. No  definite pleural effusions. IMPRESSION: Pulmonary fibrosis.  Prominent cardiac silhouette. Electronically Signed   By: Sammie Bench M.D.   On: 06/10/2022 19:44    Pending Labs Unresulted Labs (From admission, onward)     Start     Ordered   05/26/22 0500  Heparin level (unfractionated)  Daily,   R     See Hyperspace for full Linked Orders Report.   05/25/22 0052   05/25/22 1800  Heparin level (unfractionated)  Once-Timed,   TIMED        05/25/22 1127   05/25/22 0500  Procalcitonin  Daily,   R      05/25/22 0047   05/25/22 0500  CBC  Daily,   R     See Hyperspace for full Linked Orders Report.   05/25/22 0052   05/25/22 0102  Legionella Pneumophila Serogp 1 Ur Ag  Once,   R        05/25/22 0102   05/25/22 0102  Strep pneumoniae urinary antigen  Once,   R        05/25/22 0102    05/25/22 0102  Expectorated Sputum Assessment w Gram Stain, Rflx to Resp Cult  Once,   R        05/25/22 0102   06/10/2022 2246  Sodium, urine, random  Once,   R        06/03/2022 2245   05/28/2022 2246  Urinalysis, Complete w Microscopic  Once,   R       Question:  Release to patient  Answer:  Immediate   05/18/2022 2245   05/28/2022 2246  Creatinine, urine, random  Once,   R        05/16/2022 2245   Signed and Held  Comprehensive metabolic panel  Tomorrow morning,   R       Question:  Release to patient  Answer:  Immediate   Signed and Held   Signed and Held  CBC  Tomorrow morning,   R       Question:  Release to patient  Answer:  Immediate   Signed and Held   Signed and Held  Magnesium  Tomorrow morning,   R        Signed and Held   Signed and Held  Phosphorus  Tomorrow morning,   R        Signed and Held            Vitals/Pain Today's Vitals   05/25/22 0827 05/25/22 0900 05/25/22 1000 05/25/22 1139  BP:  103/69 93/62   Pulse:  83 84 87  Resp:  (!) 24 (!) 21 (!) 22  Temp:    98 F (36.7 C)  TempSrc:      SpO2: 91% 94% 90% 92%  Weight:      Height:      PainSc:        Isolation Precautions Droplet precaution  Medications Medications  heparin ADULT infusion 100 units/mL (25000 units/253m) (1,100 Units/hr Intravenous Rate/Dose Change 05/25/22 1146)  cefTRIAXone (ROCEPHIN) 2 g in sodium chloride 0.9 % 100 mL IVPB (has no administration in time range)  azithromycin (ZITHROMAX) 500 mg in sodium chloride 0.9 % 250 mL IVPB (has no administration in time range)  hydrocortisone sodium succinate (SOLU-CORTEF) 100 MG injection 100 mg (100 mg Intravenous Given 05/25/22 0912)  pantoprazole (PROTONIX) EC tablet 40 mg (40 mg Oral Given 05/25/22 0911)  albuterol (PROVENTIL) (2.5 MG/3ML) 0.083% nebulizer solution 5 mg (5 mg Nebulization Given 05/28/2022 1956)  methylPREDNISolone sodium succinate (SOLU-MEDROL) 125 mg/2 mL injection 125 mg (125 mg Intravenous Given 05/25/2022 1956)  sodium chloride 0.9  % bolus 500 mL (0 mLs Intravenous Stopped 06/10/2022 2326)  cefTRIAXone (ROCEPHIN) 1 g in sodium chloride 0.9 % 100 mL IVPB (0 g Intravenous Stopped 05/23/2022 2305)  azithromycin (ZITHROMAX) 500 mg in sodium chloride 0.9 % 250 mL IVPB (0 mg Intravenous Stopped 05/25/22 0138)  vancomycin (VANCOREADY) IVPB 1500 mg/300 mL (0 mg Intravenous Stopped 05/25/22 0345)  ceFEPIme (MAXIPIME) 2 g in sodium chloride 0.9 % 100 mL IVPB (0 g Intravenous Stopped 05/25/22 0011)  iohexol (OMNIPAQUE) 350 MG/ML injection 80 mL (80 mLs Intravenous Contrast Given 05/23/2022 2312)  heparin bolus via infusion 3,000 Units (3,000 Units Intravenous Bolus from Bag 05/25/22 0145)    Mobility walks with device Moderate fall risk   Focused Assessments    R Recommendations: See Admitting Provider Note  Report given to:   Additional Notes:

## 2022-05-25 NOTE — Consult Note (Signed)
NAME:  Veronica Stone, MRN:  [id number], DOB:  1931-05-21, LOS: 1 ADMISSION DATE:  06/13/2022, CONSULTATION DATE:  05/25/2022 REFERRING MD:  Roel Cluck - [facility] CHIEF COMPLAINT:  Dyspnea, Hypotension   History of Present Illness:  89 year old woman who presented to [facility] ED 1/9 for SOB, dyspnea. PMHx significant for HTN, HLD, pulmonary fibrosis (baseline 4L O2, followed by Dr. Elsworth Soho), RA (on long-term steroids, Remicade, Humira).  Presented to [facility] ED 1/9 with progressively worsening dyspnea x 2 weeks. Chills started the day prior to presentation along with ongoing cough (somewhat chronic). Denies fevers, chest pain, N/V/D, abd pain, myalgias, exposures to known sick contacts. Has had decreased PO intake due to weakness and just not feeling well. Daughter wanted her to come to hospital sooner but she denied.  CTA Chest that demonstrated bilateral upper lobe PE with RV/LV 3.2 along with known patchy GGOs bilaterally. She required 10L HFNC to maintain sats high 90s.  She was admitted by [facility] but then while in ED, had hypotension with SBP down to 80s. MAP's remained in high 60s to 70s throughout. Due to her drop in BP and O2 requirement, PCCM called in consultation.  She tells me that she is a DNR/DNI and that she is set on that; however, her daughter would never go for it. She requests that we do not call her daughter at this hour. I asked her how we should list her for this admission and she states she feels to leave it as is (full code) until she talks to her daughter personally. When I told her that this is an important thing to get right in case we ran into an emergency, she said "you don't know my daughter and it's quarter to two in the morning so don't call".  Pertinent Medical History:   Past Medical History:  Diagnosis Date   Colon, diverticulosis Nov 2007   Hyperlipemia    Hypertension    IBS (irritable bowel syndrome)    Irregular heart rate    Personal history of colonic polyps 04/10/2006    hyperplastic   Pulmonary fibrosis (HCC)    Rheumatoid arthritis(714.0)    Significant Hospital Events: Including procedures, antibiotic start and stop dates in addition to other pertinent events   1/10 Admit via [facility] [place]. PCCM consulted for PE, pulmonary fibrosis management  Interim History / Subjective:  Feeling well overall, reports "much better than when I came in!" No significant complaints this morning, just feels tired Remains in ED awaiting bed placement  Objective:  Blood pressure 97/69, pulse 81, temperature 98 F (36.7 C), resp. rate (!) 22, height '5\' 3"'$  (1.6 m), weight 79.8 kg, SpO2 92 %.        Intake/Output Summary (Last 24 hours) at 05/25/2022 0805 Last data filed at 05/25/2022 0345 Gross per 24 hour  Intake 1350 ml  Output --  Net 1350 ml    Filed Weights   05/28/2022 1917  Weight: 79.8 kg   Physical Examination: General: Chronically ill-appearing elderly woman in NAD. Pleasant and talkative. HEENT: [place]/AT, anicteric sclera, PERRL, dry mucous membranes. Neuro: Awake, oriented x 4. Responds to verbal stimuli. Following commands consistently. Moves all 4 extremities spontaneously. Mild generalized weakness. CV: RRR, no m/g/r. PULM: Breathing even and unlabored on 6LNC. Lung fields diminished throughout, L > R; faint bibasilar crackles. Mild conversational dyspnea with associated desats. GI: Soft, nontender, nondistended. Normoactive bowel sounds. Extremities: Chronic bilateral symmetric 2+ nonpitting LE edema noted. Skin: Warm/dry, no rashes.  Labs/imaging personally reviewed:  CTA chest  1/10 > bilateral upper lobe PE with RV/LV 3.2 along with known patchy GGOs bilaterally. Echo 1/10 >  LE duplex 1/10 >   Assessment & Plan:   Bilateral upper lobe PE Acute DVT L peroneal veins While RV/LV is elevated at 3.2, suspect most of this is due to underlying lung disease as her clot burden is not that large. Doubt that her PE is contributing much to her soft pressures. -  Continue heparin gtt for anticoagulation - LE Dopplers 1/10 demonstrating acute DVT of L peroneal veins - F/u Echo, pending  Hx IPF with UIP pattern. Acute on chronic hypoxic respiratory failure - 2/2 above + PE. Possible CAP. - Continue supplemental O2 support - Wean O2 for sat 88-92% - Bronchodilators as ordered - Continue steroids - Pulmonary hygiene - CAP coverage with ceftriaxone, azithromycin - F/u strep pneumo, legionella  Possible sepsis - hard to interpret her elevated lactate in setting IPF/hypoxia/PE vs underlying infection. - Trend WBC, fever curve - LA improving - F/u Cx data - Continue broad-spectrum antibiotics as above  Remainder per Primary Team  Best practice (evaluated daily):  Per [facility]  Signature:   Lestine Mount, PA-C [facility] 05/25/22 8:05 AM  Please see [email or web address] for pager details.  From 7A-7P if no response, please call [phone] After hours, please call ELink [phone]

## 2022-05-25 NOTE — Progress Notes (Signed)
Pt arrived to rm 2 from ED. CHG wipe given. Initiated tele. VSS. Oriented pt to the unit. Pt's sat dropped at 85 % at 6 L. Increased 02 to 10L currently. Will working on wean pt down to [place], Therapist, sports

## 2022-05-25 NOTE — Progress Notes (Signed)
ANTICOAGULATION and ANTIMICROBIAL CONSULT NOTE - Initial Consult  Pharmacy Consult for heparin and vancomycin/cefepime Indication:  PE and sepsis  Allergies  Allergen Reactions   Dilaudid [Hydromorphone Hcl] Nausea And Vomiting   Penicillins Itching and Rash   Statins Itching    Patient Measurements: Height: '5\' 3"'$  (160 cm) Weight: 79.8 kg (175 lb 14.8 oz) IBW/kg (Calculated) : 52.4 Heparin Dosing Weight: 70kg  Vital Signs: Temp: 98 F (36.7 C) (01/09 2356) Temp Source: Oral (01/09 2356) BP: 97/64 (01/09 2356) Pulse Rate: 100 (01/09 2356)  Labs: Recent Labs    05/23/2022 1936 05/26/2022 1951  HGB 15.6* 16.0*  HCT 48.8* 47.0*  PLT 278  --   CREATININE 1.43*  --     Estimated Creatinine Clearance: 27.2 mL/min (A) (by C-G formula based on SCr of 1.43 mg/dL (H)).   Medical History: Past Medical History:  Diagnosis Date   Colon, diverticulosis Nov 2007   Hyperlipemia    Hypertension    IBS (irritable bowel syndrome)    Irregular heart rate    Personal history of colonic polyps 04/10/2006   hyperplastic   Pulmonary fibrosis (HCC)    Rheumatoid arthritis(714.0)     Assessment: 89yo female c/o worsening SOB and generalized weakness, started on ABX for PNA/sepsis (to continue vancomycin and cefepime) then sent to CT which reveals bilateral PE with evidence of RHS >> to begin heparin.  Goal of Therapy:  Heparin level 0.3-0.7 units/ml Vancomycin AUC 400-550 Monitor platelets by anticoagulation protocol: Yes   Plan:  Heparin 3000 units IV bolus x1 followed by infusion at 1200 units/hr. Vancomycin '1500mg'$  IV x1 followed by '1000mg'$  IV Q48H (estimated AUC 540). Cefepime 2g IV Q24H. Monitor heparin levels and CBC; consider vanc peak/trough if to stay on vanc; f/u discharge [facility] plans.  Wynona Neat, PharmD, BCPS  05/25/2022,12:44 AM

## 2022-05-25 NOTE — Assessment & Plan Note (Signed)
this patient has acute respiratory failure with Hypoxia as documented by the presence of following: O2 saturatio< 90% on baseline 4 L Likely due to: Pulmonary embolism Provide O2 therapy and titrate as needed  Continuous pulse ox   check Pulse ox with ambulation prior to discharge   may need  TC consult for home O2 set up

## 2022-05-25 NOTE — Assessment & Plan Note (Signed)
Continue home dose of prednisone

## 2022-05-25 NOTE — Assessment & Plan Note (Signed)
Hydrocortizone stress dose steroids As pt has been on chronic prednisone

## 2022-05-25 NOTE — Assessment & Plan Note (Signed)
In the setting of hypotension, hypoxia and PE Echo in AM  Pt is on HEparin for active PE Email cardiology Continue to cycel Ce  Pt denies any CP no ischemic changes on ECG  Suspect demand ischemia

## 2022-05-25 NOTE — ED Notes (Signed)
[place], PA-C at bedside confirming code status with patient. Per patient, she wishes to be DNR instead of full code. This RN at bedside to witness patient stating this with the PA-C.

## 2022-05-25 NOTE — ED Notes (Signed)
Rate of heparin drip verified.

## 2022-05-25 NOTE — Progress Notes (Signed)
[facility] PROGRESS NOTE    Progress Note  Veronica Stone  NGE:[id number] DOB: 1931-12-13 DOA: 05/28/2022 PCP: Deland Pretty, MD     Brief Narrative:   Veronica Stone is an 89 y.o. female past medical history significant for pulmonary fibrosis on 4 L of oxygen at home on steroids, there is probably due to rheumatoid arthritis for which she is on Remicade, history of AAA, gastric ulcer, history of DVT comes in with shortness of breath that started about 2 weeks prior to admission cyanotic on arrival satting 80% in the ED placed on 12 L of oxygen, CT angio of the chest showed bilateral PE, new patchy groundglass opacity bilaterally    Assessment/Plan:   Acute on chronic respiratory failure with hypoxia due to PE: Started on IV heparin pulmonary and critical care was consulted. They also recommended a 2D echo and lower extremity Doppler. They recommended to continue steroids. Patient was initially hypotensive which resolved with an IV bolus.  Pulmonary was consulted relates her clot burden is not that extensive and they think her right heart strain is not new. Antihypertensive medications were held. Respiratory panel is negative.  History of rheumatoid arthritis: Continue Remicade and prednisone.  Hypotension: Is hard to interpret her lactic acid in the setting of IPF hypoxia and PE. Continue IV antibiotics.  Hold antihypertensive medication. Her blood pressure is trending up, continue stress dose steroids. Cultures have been sent.  She was started empirically on Rocephin and azithromycin  Elevated troponins: She denies any chest pain likely demand ischemia. 2D echo is pending. Twelve-lead EKG showed no signs of ischemia.  Essential hypertension: Continue to hold diuretic and ARB her blood pressure is borderline.     DVT prophylaxis: lovenox Family Communication:none Status is: Inpatient Remains inpatient appropriate because: Acute on chronic respiratory failure  with hypoxia due to PE    Code Status:     Code Status Orders  (From admission, onward)           Start     Ordered   05/25/22 0100  Full code  Continuous       Question:  By:  Answer:  Consent: discussion documented in EHR   05/25/22 0100           Code Status History     This patient has a current code status but no historical code status.      Advance Directive Documentation    [place] Most Recent Value  Type of Advance Directive Living will  Pre-existing out of facility DNR order (yellow form or pink MOST form) --  "MOST" Form in Place? --         IV Access:   Peripheral IV   Procedures and diagnostic studies:   CT Angio Chest PE W and/or Wo Contrast  Addendum Date: 06/04/2022   ADDENDUM REPORT: 05/16/2022 23:49 ADDENDUM: These results were called by telephone at the time of interpretation on 05/31/2022 at 11:49 pm to provider Dr. Roel Cluck, who verbally acknowledged these results. Electronically Signed   By: Ronney Asters M.D.   On: 06/12/2022 23:49   Result Date: 05/25/2022 CLINICAL DATA:  Shortness of breath and chills.  Abdominal pain. EXAM: CT ANGIOGRAPHY CHEST CT ABDOMEN AND PELVIS WITH CONTRAST TECHNIQUE: Multidetector CT imaging of the chest was performed using the standard protocol during bolus administration of intravenous contrast. Multiplanar CT image reconstructions and MIPs were obtained to evaluate the vascular anatomy. Multidetector CT imaging of the abdomen and pelvis was performed using the standard  protocol during bolus administration of intravenous contrast. RADIATION DOSE REDUCTION: This exam was performed according to the departmental dose-optimization program which includes automated exposure control, adjustment of the mA and/or kV according to patient size and/or use of iterative reconstruction technique. CONTRAST:  34m OMNIPAQUE IOHEXOL 350 MG/ML SOLN COMPARISON:  CT chest 06/06/2016.  Lumbar spine x-ray 12/09/2011. FINDINGS: CTA  CHEST FINDINGS Cardiovascular: Heart is mildly enlarged. Aorta is normal in size. There are atherosclerotic calcifications of the aorta. There is adequate opacification of the pulmonary arteries to the segmental level. There are lobar and segmental left upper lobe and right upper lobe pulmonary emboli. Mediastinum/Nodes: No enlarged mediastinal, hilar, or axillary lymph nodes. Thyroid gland, trachea, and esophagus demonstrate no significant findings. Lungs/Pleura: Fibrotic and interstitial opacities are again seen throughout both lungs, increased compared to 2018. There are new patchy ground-glass opacities in the bilateral upper lobes. There is no pleural effusion or pneumothorax. Trachea and central airways are patent. Musculoskeletal: No chest wall abnormality. No acute or significant osseous findings. Review of the MIP images confirms the above findings. CT ABDOMEN and PELVIS FINDINGS Hepatobiliary: No focal liver abnormality is seen. Status post cholecystectomy. No biliary dilatation. Pancreas: Unremarkable. No pancreatic ductal dilatation or surrounding inflammatory changes. Spleen: Normal in size without focal abnormality. Adrenals/Urinary Tract: There are subcentimeter cysts in both kidneys. Otherwise, the kidneys, adrenal glands and bladder are within normal limits. Stomach/Bowel: There is a small hiatal hernia. Stomach is otherwise within normal limits. Appendix is not seen. No evidence of bowel wall thickening, distention, or inflammatory changes. There is sigmoid colon diverticulosis. Vascular/Lymphatic: Aortic atherosclerosis. No enlarged abdominal or pelvic lymph nodes. Reproductive: Status post hysterectomy. No adnexal masses. Other: No abdominal wall hernia or abnormality. No abdominopelvic ascites. Musculoskeletal: L1 compression deformity has mildly progressed compared to 2013. Review of the MIP images confirms the above findings. IMPRESSION: 1. Bilateral upper lobe lobar and segmental pulmonary  emboli. Positive for acute PE with CTevidence of right heart strain (RV/LV Ratio = 3.2) consistent with at least submassive (intermediate risk) PE. The presence of right heart strain has been associated with an increased risk of morbidity and mortality. 2. New patchy ground-glass opacities in the bilateral upper lobes, likely infectious/inflammatory. 3. Findings compatible with chronic interstitial lung disease, increased compared to 2018. 4. No acute localizing process in the abdomen or pelvis. 5. Sigmoid colon diverticulosis. Aortic Atherosclerosis (ICD10-I70.0). Electronically Signed: By: ARonney AstersM.D. On: 05/27/2022 23:40   CT ABDOMEN PELVIS W CONTRAST  Addendum Date: 05/30/2022   ADDENDUM REPORT: 06/05/2022 23:49 ADDENDUM: These results were called by telephone at the time of interpretation on 06/07/2022 at 11:49 pm to provider Dr. DRoel Cluck who verbally acknowledged these results. Electronically Signed   By: ARonney AstersM.D.   On: 06/15/2022 23:49   Result Date: 06/14/2022 CLINICAL DATA:  Shortness of breath and chills.  Abdominal pain. EXAM: CT ANGIOGRAPHY CHEST CT ABDOMEN AND PELVIS WITH CONTRAST TECHNIQUE: Multidetector CT imaging of the chest was performed using the standard protocol during bolus administration of intravenous contrast. Multiplanar CT image reconstructions and MIPs were obtained to evaluate the vascular anatomy. Multidetector CT imaging of the abdomen and pelvis was performed using the standard protocol during bolus administration of intravenous contrast. RADIATION DOSE REDUCTION: This exam was performed according to the departmental dose-optimization program which includes automated exposure control, adjustment of the mA and/or kV according to patient size and/or use of iterative reconstruction technique. CONTRAST:  857mOMNIPAQUE IOHEXOL 350 MG/ML SOLN COMPARISON:  CT chest 06/06/2016.  Lumbar spine x-ray 12/09/2011. FINDINGS: CTA CHEST FINDINGS Cardiovascular: Heart is mildly  enlarged. Aorta is normal in size. There are atherosclerotic calcifications of the aorta. There is adequate opacification of the pulmonary arteries to the segmental level. There are lobar and segmental left upper lobe and right upper lobe pulmonary emboli. Mediastinum/Nodes: No enlarged mediastinal, hilar, or axillary lymph nodes. Thyroid gland, trachea, and esophagus demonstrate no significant findings. Lungs/Pleura: Fibrotic and interstitial opacities are again seen throughout both lungs, increased compared to 2018. There are new patchy ground-glass opacities in the bilateral upper lobes. There is no pleural effusion or pneumothorax. Trachea and central airways are patent. Musculoskeletal: No chest wall abnormality. No acute or significant osseous findings. Review of the MIP images confirms the above findings. CT ABDOMEN and PELVIS FINDINGS Hepatobiliary: No focal liver abnormality is seen. Status post cholecystectomy. No biliary dilatation. Pancreas: Unremarkable. No pancreatic ductal dilatation or surrounding inflammatory changes. Spleen: Normal in size without focal abnormality. Adrenals/Urinary Tract: There are subcentimeter cysts in both kidneys. Otherwise, the kidneys, adrenal glands and bladder are within normal limits. Stomach/Bowel: There is a small hiatal hernia. Stomach is otherwise within normal limits. Appendix is not seen. No evidence of bowel wall thickening, distention, or inflammatory changes. There is sigmoid colon diverticulosis. Vascular/Lymphatic: Aortic atherosclerosis. No enlarged abdominal or pelvic lymph nodes. Reproductive: Status post hysterectomy. No adnexal masses. Other: No abdominal wall hernia or abnormality. No abdominopelvic ascites. Musculoskeletal: L1 compression deformity has mildly progressed compared to 2013. Review of the MIP images confirms the above findings. IMPRESSION: 1. Bilateral upper lobe lobar and segmental pulmonary emboli. Positive for acute PE with CTevidence of  right heart strain (RV/LV Ratio = 3.2) consistent with at least submassive (intermediate risk) PE. The presence of right heart strain has been associated with an increased risk of morbidity and mortality. 2. New patchy ground-glass opacities in the bilateral upper lobes, likely infectious/inflammatory. 3. Findings compatible with chronic interstitial lung disease, increased compared to 2018. 4. No acute localizing process in the abdomen or pelvis. 5. Sigmoid colon diverticulosis. Aortic Atherosclerosis (ICD10-I70.0). Electronically Signed: By: Ronney Asters M.D. On: 05/25/2022 23:40   DG Chest Port 1 View  Result Date: 05/19/2022 CLINICAL DATA:  SOB EXAM: PORTABLE CHEST 1 VIEW COMPARISON:  02/24/2021 FINDINGS: Cardiac silhouette is enlarged. Pulmonary interstitial changes consistent with fibrosis similar to the previous examination. No pneumothorax. Calcified aorta. No definite pleural effusions. IMPRESSION: Pulmonary fibrosis.  Prominent cardiac silhouette. Electronically Signed   By: Sammie Bench M.D.   On: 06/05/2022 19:44     Medical Consultants:   None.   Subjective:    [name] relates her breathing is about the same.  Objective:    Vitals:   05/25/22 0600 05/25/22 0630 05/25/22 0700 05/25/22 0732  BP: '94/70 91/72 95/71 '$ 101/73  Pulse: 81 80 80 81  Resp: (!) 23 (!) 21 (!) 23 20  Temp:    98 F (36.7 C)  TempSrc:      SpO2: 97% 97% 95% 91%  Weight:      Height:       SpO2: 91 % O2 Flow Rate (L/min): 6 L/min   Intake/Output Summary (Last 24 hours) at 05/25/2022 0748 Last data filed at 05/25/2022 0345 Gross per 24 hour  Intake 1350 ml  Output --  Net 1350 ml   Filed Weights   06/08/2022 1917  Weight: 79.8 kg    Exam: General exam: In no acute distress. Respiratory system: Good air movement and diffuse crackles bilaterally. Cardiovascular system:  S1 & S2 heard, RRR. No JVD. Gastrointestinal system: Abdomen is nondistended, soft and nontender.  Extremities:  No pedal edema. Skin: No rashes, lesions or ulcers Psychiatry: Judgement and insight appear normal. Mood & affect appropriate.    Data Reviewed:    Labs: Basic Metabolic Panel: Recent Labs  Lab 05/31/2022 1936 05/17/2022 1951 05/25/22 0109  NA 138 136  --   K 3.9 4.2  --   CL 97*  --   --   CO2 23  --   --   GLUCOSE 165*  --   --   BUN 17  --   --   CREATININE 1.43*  --   --   CALCIUM 9.1  --   --   MG  --   --  2.1  PHOS  --   --  3.6   GFR Estimated Creatinine Clearance: 27.2 mL/min (A) (by C-G formula based on SCr of 1.43 mg/dL (H)). Liver Function Tests: Recent Labs  Lab 06/10/2022 1936  AST 60*  ALT 27  ALKPHOS 66  BILITOT 1.0  PROT 5.9*  ALBUMIN 3.4*   No results for input(s): "LIPASE", "AMYLASE" in the last 168 hours. No results for input(s): "AMMONIA" in the last 168 hours. Coagulation profile Recent Labs  Lab 05/25/22 0009  INR 1.3*   COVID-19 Labs  No results for input(s): "DDIMER", "FERRITIN", "LDH", "CRP" in the last 72 hours.  Lab Results  Component Value Date   [place] NEGATIVE 05/22/2022    CBC: Recent Labs  Lab 05/26/2022 1936 05/23/2022 1951 05/25/22 0630  WBC 18.0*  --  16.1*  NEUTROABS 14.0*  --   --   HGB 15.6* 16.0* 13.0  HCT 48.8* 47.0* 39.8  MCV 88.7  --  88.2  PLT 278  --  196   Cardiac Enzymes: Recent Labs  Lab 05/25/22 0109  CKTOTAL 136   BNP (last 3 results) No results for input(s): "PROBNP" in the last 8760 hours. CBG: No results for input(s): "GLUCAP" in the last 168 hours. D-Dimer: No results for input(s): "DDIMER" in the last 72 hours. Hgb A1c: No results for input(s): "HGBA1C" in the last 72 hours. Lipid Profile: No results for input(s): "CHOL", "HDL", "LDLCALC", "TRIG", "CHOLHDL", "LDLDIRECT" in the last 72 hours. Thyroid function studies: Recent Labs    05/25/22 0109  TSH 0.513   Anemia work up: No results for input(s): "VITAMINB12", "FOLATE", "FERRITIN", "TIBC", "IRON", "RETICCTPCT" in the last 72  hours. Sepsis Labs: Recent Labs  Lab 05/19/2022 1915 06/06/2022 1936 06/12/2022 2115 05/25/22 0009 05/25/22 0109 05/25/22 0220 05/25/22 0630  PROCALCITON  --   --   --   --  3.58  --   --   WBC  --  18.0*  --   --   --   --  16.1*  LATICACIDVEN 5.2*  --  2.6* 2.1*  --  1.9  --    Microbiology Recent Results (from the past 240 hour(s))  Resp panel by RT-PCR (RSV, Flu A&B, Covid) Anterior Nasal Swab     Status: None   Collection Time: 05/28/2022  7:15 PM   Specimen: Anterior Nasal Swab  Result Value Ref Range Status   SARS Coronavirus 2 by RT PCR NEGATIVE NEGATIVE Final    Comment: (NOTE) SARS-CoV-2 target nucleic acids are NOT DETECTED.  The SARS-CoV-2 RNA is generally detectable in upper respiratory specimens during the acute phase of infection. The lowest concentration of SARS-CoV-2 viral copies this assay can detect is 138 copies/mL.  A negative result does not preclude SARS-Cov-2 infection and should not be used as the sole basis for treatment or other patient management decisions. A negative result may occur with  improper specimen collection/handling, submission of specimen other than nasopharyngeal swab, presence of viral mutation(s) within the areas targeted by this assay, and inadequate number of viral copies(<138 copies/mL). A negative result must be combined with clinical observations, patient history, and epidemiological information. The expected result is Negative.  Fact Sheet for Patients:  EntrepreneurPulse.com.au  Fact Sheet for Healthcare Providers:  IncredibleEmployment.be  This test is no t yet approved or cleared by the [place] FDA and  has been authorized for detection and/or diagnosis of SARS-CoV-2 by FDA under an Emergency Use Authorization (EUA). This EUA will remain  in effect (meaning this test can be used) for the duration of the COVID-19 declaration under Section 564(b)(1) of the Act, 21 U.S.C.section  360bbb-3(b)(1), unless the authorization is terminated  or revoked sooner.       Influenza A by PCR NEGATIVE NEGATIVE Final   Influenza B by PCR NEGATIVE NEGATIVE Final    Comment: (NOTE) The Xpert Xpress SARS-CoV-2/FLU/RSV plus assay is intended as an aid in the diagnosis of influenza from Nasopharyngeal swab specimens and should not be used as a sole basis for treatment. Nasal washings and aspirates are unacceptable for Xpert Xpress SARS-CoV-2/FLU/RSV testing.  Fact Sheet for Patients: EntrepreneurPulse.com.au  Fact Sheet for Healthcare Providers: IncredibleEmployment.be  This test is not yet approved or cleared by the [place] FDA and has been authorized for detection and/or diagnosis of SARS-CoV-2 by FDA under an Emergency Use Authorization (EUA). This EUA will remain in effect (meaning this test can be used) for the duration of the COVID-19 declaration under Section 564(b)(1) of the Act, 21 U.S.C. section 360bbb-3(b)(1), unless the authorization is terminated or revoked.     Resp Syncytial Virus by PCR NEGATIVE NEGATIVE Final    Comment: (NOTE) Fact Sheet for Patients: EntrepreneurPulse.com.au  Fact Sheet for Healthcare Providers: IncredibleEmployment.be  This test is not yet approved or cleared by the [place] FDA and has been authorized for detection and/or diagnosis of SARS-CoV-2 by FDA under an Emergency Use Authorization (EUA). This EUA will remain in effect (meaning this test can be used) for the duration of the COVID-19 declaration under Section 564(b)(1) of the Act, 21 U.S.C. section 360bbb-3(b)(1), unless the authorization is terminated or revoked.  Performed at [facility], [place] [address], [place], [place] [address]   Respiratory (~20 pathogens) panel by PCR     Status: None   Collection Time: 05/25/22  1:12 AM   Specimen: Nasopharyngeal Swab; Respiratory  Result  Value Ref Range Status   Adenovirus NOT DETECTED NOT DETECTED Final   Coronavirus 229E NOT DETECTED NOT DETECTED Final    Comment: (NOTE) The Coronavirus on the Respiratory Panel, DOES NOT test for the novel  Coronavirus (2019 nCoV)    Coronavirus HKU1 NOT DETECTED NOT DETECTED Final   Coronavirus NL63 NOT DETECTED NOT DETECTED Final   Coronavirus OC43 NOT DETECTED NOT DETECTED Final   Metapneumovirus NOT DETECTED NOT DETECTED Final   Rhinovirus / Enterovirus NOT DETECTED NOT DETECTED Final   Influenza A NOT DETECTED NOT DETECTED Final   Influenza B NOT DETECTED NOT DETECTED Final   Parainfluenza Virus 1 NOT DETECTED NOT DETECTED Final   Parainfluenza Virus 2 NOT DETECTED NOT DETECTED Final   Parainfluenza Virus 3 NOT DETECTED NOT DETECTED Final   Parainfluenza Virus 4 NOT  DETECTED NOT DETECTED Final   Respiratory Syncytial Virus NOT DETECTED NOT DETECTED Final   Bordetella pertussis NOT DETECTED NOT DETECTED Final   Bordetella Parapertussis NOT DETECTED NOT DETECTED Final   Chlamydophila pneumoniae NOT DETECTED NOT DETECTED Final   Mycoplasma pneumoniae NOT DETECTED NOT DETECTED Final    Comment: Performed at [facility], [place] [address], [place], [place] [address]     Medications:    hydrocortisone sod succinate (SOLU-CORTEF) inj  100 mg Intravenous Q8H   Continuous Infusions:  [START ON 05/26/2022] azithromycin     cefTRIAXone (ROCEPHIN)  IV     heparin 1,200 Units/hr (05/25/22 0144)      LOS: 1 day   Charlynne Cousins  [facility]  05/25/2022, 7:48 AM

## 2022-05-25 NOTE — Progress Notes (Signed)
Pt's tests are negative. Questioned about d/c droplet precaution on pt's chart. Waiting for MD to clarify.   Lavenia Atlas, RN

## 2022-05-25 NOTE — Assessment & Plan Note (Signed)
Will gently rehydrate and follow fluid level.  I suspect worsening leukocytosis in the setting of hemoconcentration expect improvement with rehydration

## 2022-05-25 NOTE — Assessment & Plan Note (Signed)
Given chills and slightly worsening leukocytosis as well as possibility of groundglass opacities on CT scan findings for tonight coverage of antibiotics Rocephin azithromycin Check procalcitonin Check sputum cultures

## 2022-05-25 NOTE — Assessment & Plan Note (Addendum)
Would benefit from follow-up with pulmonology   Otherwise continue home medications

## 2022-05-25 NOTE — Progress Notes (Signed)
ANTICOAGULATION CONSULT NOTE - Follow-Up Consult  Pharmacy Consult for heparin  Indication: PE  Allergies  Allergen Reactions   Dilaudid [Hydromorphone Hcl] Nausea And Vomiting   Penicillins Itching and Rash   Statins Itching    Patient Measurements: Height: '5\' 3"'$  (160 cm) Weight: 79.8 kg (175 lb 14.8 oz) IBW/kg (Calculated) : 52.4 Heparin Dosing Weight: 70kg  Vital Signs: Temp: 98 F (36.7 C) (01/10 1629) Temp Source: Oral (01/10 1629) BP: 109/66 (01/10 1629) Pulse Rate: 91 (01/10 1629)  Labs: Recent Labs    05/20/2022 1936 05/28/2022 1951 05/25/22 0009 05/25/22 0109 05/25/22 0240 05/25/22 0630 05/25/22 1036 05/25/22 1508 05/25/22 1745  HGB 15.6* 16.0*  --   --   --  13.0  --  14.2  --   HCT 48.8* 47.0*  --   --   --  39.8  --  43.1  --   PLT 278  --   --   --   --  196  --  242  --   APTT  --   --  29  --   --   --   --   --   --   LABPROT  --   --  15.7*  --   --   --   --   --   --   INR  --   --  1.3*  --   --   --   --   --   --   HEPARINUNFRC  --   --   --   --   --   --  0.72*  --  0.75*  CREATININE 1.43*  --   --   --   --   --   --  1.11*  --   CKTOTAL  --   --   --  136  --   --   --   --   --   TROPONINIHS  --   --   --  9,381* 1,633* 1,503*  --   --   --      Estimated Creatinine Clearance: 35.1 mL/min (A) (by C-G formula based on SCr of 1.11 mg/dL (H)).   Medical History: Past Medical History:  Diagnosis Date   Colon, diverticulosis Nov 2007   Hyperlipemia    Hypertension    IBS (irritable bowel syndrome)    Irregular heart rate    Personal history of colonic polyps 04/10/2006   hyperplastic   Pulmonary fibrosis (HCC)    Rheumatoid arthritis(714.0)     Assessment: 89 yo female c/o worsening SOB and generalized weakness, started on ABX for PNA/sepsis then sent to CT which reveals bilateral PE with evidence of R heart strain. Pharmacy consulted to dose and manage heparin infusion for treatment of PE.   Heparin level remains slightly  elevated (0.75) on infusion at 1100 units/hr. No bleeding noted.  Goal of Therapy:  Heparin level 0.3-0.7 units/ml Monitor platelets by anticoagulation protocol: Yes   Plan:  Decrease heparin infusion rate to 950 units/hr. Check heparin level in 8 hours  Sherlon Handing, PharmD, BCPS Please see amion for complete clinical pharmacist phone list 05/25/2022 6:36 PM

## 2022-05-25 NOTE — Assessment & Plan Note (Signed)
In the setting of acute hypoxia and dehydration improving with IV fluid and oxygen

## 2022-05-25 NOTE — ED Notes (Signed)
ICU MD at bedside to assess patient and speak with her about admission plans.

## 2022-05-26 DIAGNOSIS — I2699 Other pulmonary embolism without acute cor pulmonale: Secondary | ICD-10-CM | POA: Diagnosis not present

## 2022-05-26 DIAGNOSIS — R7989 Other specified abnormal findings of blood chemistry: Secondary | ICD-10-CM | POA: Diagnosis not present

## 2022-05-26 DIAGNOSIS — J189 Pneumonia, unspecified organism: Secondary | ICD-10-CM | POA: Diagnosis not present

## 2022-05-26 DIAGNOSIS — J8410 Pulmonary fibrosis, unspecified: Secondary | ICD-10-CM | POA: Diagnosis not present

## 2022-05-26 LAB — CBC
HCT: 40.6 % (ref 36.0–46.0)
Hemoglobin: 13.3 g/dL (ref 12.0–15.0)
MCH: 28.7 pg (ref 26.0–34.0)
MCHC: 32.8 g/dL (ref 30.0–36.0)
MCV: 87.5 fL (ref 80.0–100.0)
Platelets: 248 10*3/uL (ref 150–400)
RBC: 4.64 MIL/uL (ref 3.87–5.11)
RDW: 13.5 % (ref 11.5–15.5)
WBC: 22.4 10*3/uL — ABNORMAL HIGH (ref 4.0–10.5)
nRBC: 0 % (ref 0.0–0.2)

## 2022-05-26 LAB — HEPARIN LEVEL (UNFRACTIONATED)
Heparin Unfractionated: 0.38 IU/mL (ref 0.30–0.70)
Heparin Unfractionated: 0.34 IU/mL (ref 0.30–0.70)

## 2022-05-26 LAB — PROCALCITONIN: Procalcitonin: 3.29 ng/mL

## 2022-05-26 MED ORDER — POTASSIUM CHLORIDE CRYS ER 20 MEQ PO TBCR
20.0000 meq | EXTENDED_RELEASE_TABLET | Freq: Two times a day (BID) | ORAL | Status: AC
  Administered 2022-05-26 (×2): 20 meq via ORAL
  Filled 2022-05-26 (×2): qty 1

## 2022-05-26 MED ORDER — POTASSIUM CHLORIDE CRYS ER 20 MEQ PO TBCR: 40.0000 meq | EXTENDED_RELEASE_TABLET | Freq: Two times a day (BID) | ORAL | Status: DC

## 2022-05-26 MED ORDER — PANTOPRAZOLE SODIUM 40 MG PO TBEC
40.0000 mg | DELAYED_RELEASE_TABLET | Freq: Two times a day (BID) | ORAL | Status: DC
  Administered 2022-05-26 – 2022-05-31 (×10): 40 mg via ORAL
  Filled 2022-05-26 (×12): qty 1

## 2022-05-26 MED ORDER — HYDROCORTISONE SOD SUC (PF) 100 MG IJ SOLR
50.0000 mg | Freq: Two times a day (BID) | INTRAMUSCULAR | Status: DC
  Administered 2022-05-26 – 2022-05-27 (×3): 50 mg via INTRAVENOUS
  Filled 2022-05-26 (×5): qty 1

## 2022-05-26 NOTE — Progress Notes (Signed)
NAME:  Veronica Stone, MRN:  [id number], DOB:  1931/09/27, LOS: 1 ADMISSION DATE:  06/12/2022, CONSULTATION DATE:  05/25/2022 REFERRING MD:  Roel Cluck - [facility] CHIEF COMPLAINT:  Dyspnea, Hypotension   History of Present Illness:  89 year old woman who presented to [facility] ED 1/9 for SOB, dyspnea. PMHx significant for HTN, HLD, pulmonary fibrosis (baseline 4L O2, followed by Dr. Elsworth Soho), RA (on long-term steroids, Remicade, Humira).  Presented to [facility] ED 1/9 with progressively worsening dyspnea x 2 weeks. Chills started the day prior to presentation along with ongoing cough (somewhat chronic). Denies fevers, chest pain, N/V/D, abd pain, myalgias, exposures to known sick contacts. Has had decreased PO intake due to weakness and just not feeling well. Daughter wanted her to come to hospital sooner but she denied.  CTA Chest that demonstrated bilateral upper lobe PE with RV/LV 3.2 along with known patchy GGOs bilaterally. She required 10L HFNC to maintain sats high 90s.  She was admitted by [facility] but then while in ED, had hypotension with SBP down to 80s. MAP's remained in high 60s to 70s throughout. Due to her drop in BP and O2 requirement, PCCM called in consultation.  She tells me that she is a DNR/DNI and that she is set on that; however, her daughter would never go for it. She requests that we do not call her daughter at this hour. I asked her how we should list her for this admission and she states she feels to leave it as is (full code) until she talks to her daughter personally. When I told her that this is an important thing to get right in case we ran into an emergency, she said "you don't know my daughter and it's quarter to two in the morning so don't call".  Pertinent Medical History:   Past Medical History:  Diagnosis Date   Colon, diverticulosis Nov 2007   Hyperlipemia    Hypertension    IBS (irritable bowel syndrome)    Irregular heart rate    Personal history of colonic polyps 04/10/2006    hyperplastic   Pulmonary fibrosis (HCC)    Rheumatoid arthritis(714.0)    Significant Hospital Events: Including procedures, antibiotic start and stop dates in addition to other pertinent events   1/10 Admit via [place]. PCCM consulted for PE, pulmonary fibrosis management  Interim History / Subjective:  No complaints  Objective:  Blood pressure 97/69, pulse 81, temperature 98 F (36.7 C), resp. rate (!) 22, height '5\' 3"'$  (1.6 m), weight 79.8 kg, SpO2 92 %.        Intake/Output Summary (Last 24 hours) at 05/25/2022 0805 Last data filed at 05/25/2022 0345 Gross per 24 hour  Intake 1350 ml  Output --  Net 1350 ml    Filed Weights   06/05/2022 1917  Weight: 79.8 kg   Physical Examination: General: Frail elderly female in no acute distress at rest HEENT: MM pink/moist Neuro: Grossly intact CV: Heart sounds are regular PULM: Decreased breath sounds in the bases  Extremities: warm/dry, 3+ edema  Skin: no rashes or lesions   Labs/imaging personally reviewed:  CTA chest 1/10 > bilateral upper lobe PE with RV/LV 3.2 along with known patchy GGOs bilaterally. Echo 1/10 > EF of 65% LE duplex 1/10 > left deep vein thrombosis  Assessment & Plan:   Bilateral upper lobe PE Acute DVT L peroneal veins While RV/LV is elevated at 3.2, suspect most of this is due to underlying lung disease as her clot burden is not that large.  Doubt that her PE is contributing much to her soft pressures. continue heparin rate Transition to oral anticoagulants  Hx IPF with UIP pattern. Acute on chronic hypoxic respiratory failure - 2/2 above + PE. Possible CAP. Wean O2 as tolerated Bronchodilators Steroids Antibiotics Follow culture data concern    Possible sepsis - hard to interpret her elevated lactate in setting IPF/hypoxia/PE vs underlying infection. Follow culture data Continue antibiotics  Remainder per Primary Team  Best practice (evaluated daily):  Per [facility]  Signature:  Richardson Landry Arthelia Callicott  ACNP Acute Care Nurse Practitioner [place] Please consult Amion 05/26/2022, 1:01 PM

## 2022-05-26 NOTE — Plan of Care (Signed)

## 2022-05-26 NOTE — Progress Notes (Signed)
Nurse requested Mobility Specialist to perform oxygen saturation test with pt which includes removing pt from oxygen both at rest and while ambulating.  Below are the results from that testing.     Patient Saturations on Room Air at Rest = spO2 85%  Patient Saturations on Room Air while Ambulating = sp02 n/a d/t low saturations at rest.   Patient Saturations on 6 Liters of oxygen while Ambulating = sp02 90%  At end of testing pt left in room on 6  Liters of oxygen.  Reported results to nurse.

## 2022-05-26 NOTE — Progress Notes (Addendum)
ADDENDUM: Confirmatory heparin level remains therapeutic at 0.34.  1:16 PM  ----------------------------------------------  ANTICOAGULATION CONSULT NOTE - [place] for heparin  Indication: PE  Allergies  Allergen Reactions   Dilaudid [Hydromorphone Hcl] Nausea And Vomiting   Penicillins Itching and Rash   Statins Itching    Patient Measurements: Height: '5\' 3"'$  (160 cm) Weight: 79.8 kg (175 lb 14.8 oz) IBW/kg (Calculated) : 52.4 Heparin Dosing Weight: 70kg  Vital Signs: Temp: 97.6 F (36.4 C) (01/11 0843) Temp Source: Oral (01/11 0843) BP: 119/70 (01/11 0843) Pulse Rate: 85 (01/11 0843)  Labs: Recent Labs    06/07/2022 1936 05/26/2022 1951 05/25/22 0009 05/25/22 0109 05/25/22 0240 05/25/22 0630 05/25/22 1036 05/25/22 1508 05/25/22 1745 05/26/22 0417  HGB 15.6*   < >  --   --   --  13.0  --  14.2  --  13.3  HCT 48.8*   < >  --   --   --  39.8  --  43.1  --  40.6  PLT 278  --   --   --   --  196  --  242  --  248  APTT  --   --  29  --   --   --   --   --   --   --   LABPROT  --   --  15.7*  --   --   --   --   --   --   --   INR  --   --  1.3*  --   --   --   --   --   --   --   HEPARINUNFRC  --   --   --   --   --   --  0.72*  --  0.75* 0.38  CREATININE 1.43*  --   --   --   --   --   --  1.11*  --   --   CKTOTAL  --   --   --  136  --   --   --   --   --   --   TROPONINIHS  --   --   --  6,720* 1,633* 1,503*  --   --   --   --    < > = values in this interval not displayed.     Estimated Creatinine Clearance: 35.1 mL/min (A) (by C-G formula based on SCr of 1.11 mg/dL (H)).   Medical History: Past Medical History:  Diagnosis Date   Colon, diverticulosis Nov 2007   Hyperlipemia    Hypertension    IBS (irritable bowel syndrome)    Irregular heart rate    Personal history of colonic polyps 04/10/2006   hyperplastic   Pulmonary fibrosis (HCC)    Rheumatoid arthritis(714.0)     Assessment: 89 yo female with CT revealing  bilateral PE with evidence of R heart strain. Pharmacy consulted to dose and manage heparin infusion for treatment of PE. Lower extremity Doppler showed bilateral DVT. Past medical history includes: DVT, AAA, gastric ulcer, pulmonary fibrosis, COPD on chronic O2, hypertension, rheumatoid arthritis.  Heparin level therapeutic this morning, after rate reduction, at 0.38 on infusion at 950 units/hr. No bleeding or infusion issues noted. Hgb and platelets stable  Goal of Therapy:  Heparin level 0.3-0.7 units/ml Monitor platelets by anticoagulation protocol: Yes   Plan:  Continue heparin infusion at 950 units/hr. Check anti-Xa level  daily while on heparin Continue to monitor H&H and platelets   Thank you for allowing [place] to participate in this patients care. Jens Som, PharmD 05/26/2022 10:45 AM  **Pharmacist phone directory can be found on [email or web address] listed under [place]**

## 2022-05-26 NOTE — Progress Notes (Signed)
[facility] PROGRESS NOTE    Progress Note  Veronica Stone  FWY:[id number] DOB: [birth year] DOA: 05/31/2022 PCP: Deland Pretty, MD     Brief Narrative:   Veronica Stone is an 89 y.o. female past medical history significant for pulmonary fibrosis on 4 L of oxygen at home on steroids, there is probably due to rheumatoid arthritis for which she is on Remicade, history of AAA, gastric ulcer, history of DVT comes in with shortness of breath that started about 2 weeks prior to admission cyanotic on arrival satting 80% in the ED placed on 12 L of oxygen, CT angio of the chest showed bilateral PE, new patchy groundglass opacity bilaterally.  Assessment/Plan:   Acute on chronic respiratory failure with hypoxia due to PE: Continue IV heparin . Lower extremity Doppler showed bilateral DVT. 2D echo showed 27% grade 1 diastolic heart failure. Continue steroids. Respiratory panel is negative. Consult PT/OT.  History of rheumatoid arthritis: Continue Remicade and prednisone.  Hypotension: Is hard to interpret her lactic acid in the setting of IPF hypoxia and PE. Continue IV antibiotics, has remained a febrile. Leukocytosis is risen due to steroids. Cultures have been sent.   Cont IV Rocephin and azithromycin.  Acute kidney injury: Likely prerenal azotemia with a baseline creatinine of less than 1 on admission 1.4 improved with resuscitation.  Elevated troponins: She denies any chest pain likely demand ischemia. 2D echo is no WMA. Twelve-lead EKG showed no signs of ischemia.  Essential hypertension: Continue hold diuretic therapy and ARB KVO IV fluids.  Hypokalemia: Repleted orally now resolved.    DVT prophylaxis: lovenox Family Communication:none Status is: Inpatient Remains inpatient appropriate because: Acute on chronic respiratory failure with hypoxia due to PE    Code Status:     Code Status Orders  (From admission, onward)           Start     Ordered    05/25/22 0100  Full code  Continuous       Question:  By:  Answer:  Consent: discussion documented in EHR   05/25/22 0100           Code Status History     This patient has a current code status but no historical code status.      Advance Directive Documentation    [place] Most Recent Value  Type of Advance Directive Living will  Pre-existing out of facility DNR order (yellow form or pink MOST form) --  "MOST" Form in Place? --         IV Access:   Peripheral IV   Procedures and diagnostic studies:   VAS [place] LOWER EXTREMITY VENOUS (DVT)  Result Date: 05/25/2022  Lower Venous DVT Study Patient Name:  Veronica Stone  Date of Exam:   05/25/2022 Medical Rec #: [id number]         Accession #:    [id number] Date of Birth: [birth year]         Patient Gender: F Patient Age:   73 years Exam Location:  [facility] Procedure:      VAS [place] LOWER EXTREMITY VENOUS (DVT) Referring Phys: Nyoka Lint DOUTOVA --------------------------------------------------------------------------------  Indications: Pulmonary embolism.  Anticoagulation: Heparin. Comparison Study: No prior studies. Performing Technologist: Darlin Coco RDMS, RVT  Examination Guidelines: A complete evaluation includes B-mode imaging, spectral Doppler, color Doppler, and power Doppler as needed of all accessible portions of each vessel. Bilateral testing is considered an integral part of a complete examination. Limited examinations for reoccurring indications may be  performed as noted. The reflux portion of the exam is performed with the patient in reverse Trendelenburg.  +---------+---------------+---------+-----------+----------+--------------+ RIGHT    CompressibilityPhasicitySpontaneityPropertiesThrombus Aging +---------+---------------+---------+-----------+----------+--------------+ CFV      Full           Yes      Yes                                  +---------+---------------+---------+-----------+----------+--------------+ SFJ      Full                                                        +---------+---------------+---------+-----------+----------+--------------+ FV Prox  Full                                                        +---------+---------------+---------+-----------+----------+--------------+ FV Mid   Full                                                        +---------+---------------+---------+-----------+----------+--------------+ FV DistalFull                                                        +---------+---------------+---------+-----------+----------+--------------+ PFV      Full                                                        +---------+---------------+---------+-----------+----------+--------------+ POP      Full           Yes      Yes                                 +---------+---------------+---------+-----------+----------+--------------+ PTV      Full                                                        +---------+---------------+---------+-----------+----------+--------------+ PERO     Full                                                        +---------+---------------+---------+-----------+----------+--------------+ Gastroc  Partial        Yes      Yes  Acute          +---------+---------------+---------+-----------+----------+--------------+   +---------+---------------+---------+-----------+----------+--------------+ LEFT     CompressibilityPhasicitySpontaneityPropertiesThrombus Aging +---------+---------------+---------+-----------+----------+--------------+ CFV      Full           Yes      Yes                                 +---------+---------------+---------+-----------+----------+--------------+ SFJ      Full                                                         +---------+---------------+---------+-----------+----------+--------------+ FV Prox  Full                                                        +---------+---------------+---------+-----------+----------+--------------+ FV Mid   Full                                                        +---------+---------------+---------+-----------+----------+--------------+ FV DistalFull                                                        +---------+---------------+---------+-----------+----------+--------------+ PFV      Full                                                        +---------+---------------+---------+-----------+----------+--------------+ POP      Full           Yes      Yes                                 +---------+---------------+---------+-----------+----------+--------------+ PTV      Full                                                        +---------+---------------+---------+-----------+----------+--------------+ PERO     None           No       No                   Acute          +---------+---------------+---------+-----------+----------+--------------+ Gastroc  Full                                                        +---------+---------------+---------+-----------+----------+--------------+  Summary: RIGHT: - Findings consistent with acute deep vein thrombosis involving the right gastrocnemius veins. - No cystic structure found in the popliteal fossa.  LEFT: - Findings consistent with acute deep vein thrombosis involving the left peroneal veins. - No cystic structure found in the popliteal fossa.  *See table(s) above for measurements and observations. Electronically signed by Servando Snare MD on 05/25/2022 at 5:24:19 PM.    Final    ECHOCARDIOGRAM COMPLETE  Result Date: 05/25/2022    ECHOCARDIOGRAM REPORT   Patient Name:   Imelda Dandridge Date of Exam: 05/25/2022 Medical Rec #:  [id number]        Height:       63.0 in Accession #:     [id number]       Weight:       175.9 lb Date of Birth:  Jul 05, 1931        BSA:          1.831 m Patient Age:    31 years         BP:           103/69 mmHg Patient Gender: F                HR:           87 bpm. Exam Location:  Inpatient Procedure: 2D Echo, Cardiac Doppler and Color Doppler Indications:    I26.02 Pulmonary embolus  History:        Patient has prior history of Echocardiogram examinations, most                 recent 12/06/2005. Signs/Symptoms:Shortness of Breath, Dyspnea,                 Chest Pain and Bacteremia; Risk Factors:Hypertension and Former                 Smoker.  Sonographer:    Roseanna Rainbow RDCS Referring Phys: Toy Baker  Sonographer Comments: Technically difficult study due to poor echo windows. IMPRESSIONS  1. Left ventricular ejection fraction, by estimation, is 65 to 70%. The left ventricle has normal function. The left ventricle has no regional wall motion abnormalities. There is mild left ventricular hypertrophy. Left ventricular diastolic parameters are consistent with Grade I diastolic dysfunction (impaired relaxation). There is the interventricular septum is flattened in systole, consistent with right ventricular pressure overload.  2. Right ventricular systolic function is moderately reduced. The right ventricular size is severely enlarged. There is moderately elevated pulmonary artery systolic pressure. The estimated right ventricular systolic pressure is 70.0 mmHg.  3. Tricuspid valve regurgitation is severe.  4. The mitral valve is grossly normal. Trivial mitral valve regurgitation. No evidence of mitral stenosis.  5. The aortic valve is grossly normal. There is mild calcification of the aortic valve. Aortic valve regurgitation is not visualized. No aortic stenosis is present.  6. A small pericardial effusion is present. The pericardial effusion is posterior to the left ventricle.  7. The inferior vena cava is normal in size with greater than 50% respiratory  variability, suggesting right atrial pressure of 3 mmHg.  8. Right atrial size was mildly dilated. FINDINGS  Left Ventricle: Left ventricular ejection fraction, by estimation, is 65 to 70%. The left ventricle has normal function. The left ventricle has no regional wall motion abnormalities. The left ventricular internal cavity size was normal in size. There is  mild left ventricular hypertrophy. The interventricular septum is flattened in systole, consistent with right ventricular pressure overload. Left  ventricular diastolic parameters are consistent with Grade I diastolic dysfunction (impaired relaxation). Right Ventricle: The right ventricular size is severely enlarged. No increase in right ventricular wall thickness. Right ventricular systolic function is moderately reduced. There is moderately elevated pulmonary artery systolic pressure. The tricuspid regurgitant velocity is 3.31 m/s, and with an assumed right atrial pressure of 15 mmHg, the estimated right ventricular systolic pressure is 42.3 mmHg. Left Atrium: Left atrial size was normal in size. Right Atrium: Right atrial size was mildly dilated. Pericardium: A small pericardial effusion is present. The pericardial effusion is posterior to the left ventricle. Presence of epicardial fat layer. Mitral Valve: The mitral valve is grossly normal. Trivial mitral valve regurgitation. No evidence of mitral valve stenosis. Tricuspid Valve: The tricuspid valve is normal in structure. Tricuspid valve regurgitation is severe. No evidence of tricuspid stenosis. Aortic Valve: The aortic valve is grossly normal. There is mild calcification of the aortic valve. Aortic valve regurgitation is not visualized. No aortic stenosis is present. Pulmonic Valve: The pulmonic valve was normal in structure. Pulmonic valve regurgitation is mild. No evidence of pulmonic stenosis. Aorta: The aortic root is normal in size and structure. Ascending aorta measurements are within normal  limits for age when indexed to body surface area. Venous: The inferior vena cava is normal in size with greater than 50% respiratory variability, suggesting right atrial pressure of 3 mmHg. IAS/Shunts: No atrial level shunt detected by color flow Doppler.  LEFT VENTRICLE PLAX 2D LVIDd:         3.30 cm   Diastology LVIDs:         1.80 cm   LV e' medial:    2.94 cm/s LV PW:         1.00 cm   LV E/e' medial:  21.7 LV IVS:        1.10 cm   LV e' lateral:   5.35 cm/s LVOT diam:     2.20 cm   LV E/e' lateral: 11.9 LV SV:         66 LV SV Index:   36 LVOT Area:     3.80 cm  RIGHT VENTRICLE             IVC RV S prime:     12.10 cm/s  IVC diam: 2.25 cm RVOT diam:      2.30 cm TAPSE (M-mode): 1.3 cm LEFT ATRIUM             Index       RIGHT ATRIUM           Index LA diam:        2.70 cm 1.47 cm/m  RA Area:     18.20 cm LA Vol (A2C):   17.7 ml 9.67 ml/m  RA Volume:   52.80 ml  28.84 ml/m LA Vol (A4C):   17.9 ml 9.78 ml/m LA Biplane Vol: 17.9 ml 9.78 ml/m  AORTIC VALVE LVOT Vmax:   111.00 cm/s LVOT Vmean:  71.900 cm/s LVOT VTI:    0.173 m  AORTA Ao Root diam: 3.30 cm Ao Asc diam:  3.80 cm MITRAL VALVE                TRICUSPID VALVE MV Area (PHT): 3.99 cm     TR Peak grad:   43.8 mmHg MV Decel Time: 190 msec     TR Vmax:        331.00 cm/s MV E velocity: 63.90 cm/s MV A velocity: 111.00 cm/s  SHUNTS MV E/A ratio:  0.58         Systemic VTI:  0.17 m                             Systemic Diam: 2.20 cm                             Pulmonic Diam: 2.30 cm Cherlynn Kaiser MD Electronically signed by Cherlynn Kaiser MD Signature Date/Time: 05/25/2022/3:36:05 PM    Final    CT Angio Chest PE W and/or Wo Contrast  Addendum Date: 05/17/2022   ADDENDUM REPORT: 06/12/2022 23:49 ADDENDUM: These results were called by telephone at the time of interpretation on 06/10/2022 at 11:49 pm to provider Dr. Roel Cluck, who verbally acknowledged these results. Electronically Signed   By: Ronney Asters M.D.   On: 06/12/2022 23:49   Result Date:  05/28/2022 CLINICAL DATA:  Shortness of breath and chills.  Abdominal pain. EXAM: CT ANGIOGRAPHY CHEST CT ABDOMEN AND PELVIS WITH CONTRAST TECHNIQUE: Multidetector CT imaging of the chest was performed using the standard protocol during bolus administration of intravenous contrast. Multiplanar CT image reconstructions and MIPs were obtained to evaluate the vascular anatomy. Multidetector CT imaging of the abdomen and pelvis was performed using the standard protocol during bolus administration of intravenous contrast. RADIATION DOSE REDUCTION: This exam was performed according to the departmental dose-optimization program which includes automated exposure control, adjustment of the mA and/or kV according to patient size and/or use of iterative reconstruction technique. CONTRAST:  26m OMNIPAQUE IOHEXOL 350 MG/ML SOLN COMPARISON:  CT chest 06/06/2016.  Lumbar spine x-ray 12/09/2011. FINDINGS: CTA CHEST FINDINGS Cardiovascular: Heart is mildly enlarged. Aorta is normal in size. There are atherosclerotic calcifications of the aorta. There is adequate opacification of the pulmonary arteries to the segmental level. There are lobar and segmental left upper lobe and right upper lobe pulmonary emboli. Mediastinum/Nodes: No enlarged mediastinal, hilar, or axillary lymph nodes. Thyroid gland, trachea, and esophagus demonstrate no significant findings. Lungs/Pleura: Fibrotic and interstitial opacities are again seen throughout both lungs, increased compared to 2018. There are new patchy ground-glass opacities in the bilateral upper lobes. There is no pleural effusion or pneumothorax. Trachea and central airways are patent. Musculoskeletal: No chest wall abnormality. No acute or significant osseous findings. Review of the MIP images confirms the above findings. CT ABDOMEN and PELVIS FINDINGS Hepatobiliary: No focal liver abnormality is seen. Status post cholecystectomy. No biliary dilatation. Pancreas: Unremarkable. No pancreatic  ductal dilatation or surrounding inflammatory changes. Spleen: Normal in size without focal abnormality. Adrenals/Urinary Tract: There are subcentimeter cysts in both kidneys. Otherwise, the kidneys, adrenal glands and bladder are within normal limits. Stomach/Bowel: There is a small hiatal hernia. Stomach is otherwise within normal limits. Appendix is not seen. No evidence of bowel wall thickening, distention, or inflammatory changes. There is sigmoid colon diverticulosis. Vascular/Lymphatic: Aortic atherosclerosis. No enlarged abdominal or pelvic lymph nodes. Reproductive: Status post hysterectomy. No adnexal masses. Other: No abdominal wall hernia or abnormality. No abdominopelvic ascites. Musculoskeletal: L1 compression deformity has mildly progressed compared to 2013. Review of the MIP images confirms the above findings. IMPRESSION: 1. Bilateral upper lobe lobar and segmental pulmonary emboli. Positive for acute PE with CTevidence of right heart strain (RV/LV Ratio = 3.2) consistent with at least submassive (intermediate risk) PE. The presence of right heart strain has been associated with an increased risk of morbidity and mortality. 2.  New patchy ground-glass opacities in the bilateral upper lobes, likely infectious/inflammatory. 3. Findings compatible with chronic interstitial lung disease, increased compared to 2018. 4. No acute localizing process in the abdomen or pelvis. 5. Sigmoid colon diverticulosis. Aortic Atherosclerosis (ICD10-I70.0). Electronically Signed: By: Ronney Asters M.D. On: 05/28/2022 23:40   CT ABDOMEN PELVIS W CONTRAST  Addendum Date: 05/25/2022   ADDENDUM REPORT: 06/03/2022 23:49 ADDENDUM: These results were called by telephone at the time of interpretation on 06/04/2022 at 11:49 pm to provider Dr. Roel Cluck, who verbally acknowledged these results. Electronically Signed   By: Ronney Asters M.D.   On: 05/19/2022 23:49   Result Date: 05/25/2022 CLINICAL DATA:  Shortness of breath and  chills.  Abdominal pain. EXAM: CT ANGIOGRAPHY CHEST CT ABDOMEN AND PELVIS WITH CONTRAST TECHNIQUE: Multidetector CT imaging of the chest was performed using the standard protocol during bolus administration of intravenous contrast. Multiplanar CT image reconstructions and MIPs were obtained to evaluate the vascular anatomy. Multidetector CT imaging of the abdomen and pelvis was performed using the standard protocol during bolus administration of intravenous contrast. RADIATION DOSE REDUCTION: This exam was performed according to the departmental dose-optimization program which includes automated exposure control, adjustment of the mA and/or kV according to patient size and/or use of iterative reconstruction technique. CONTRAST:  21m OMNIPAQUE IOHEXOL 350 MG/ML SOLN COMPARISON:  CT chest 06/06/2016.  Lumbar spine x-ray 12/09/2011. FINDINGS: CTA CHEST FINDINGS Cardiovascular: Heart is mildly enlarged. Aorta is normal in size. There are atherosclerotic calcifications of the aorta. There is adequate opacification of the pulmonary arteries to the segmental level. There are lobar and segmental left upper lobe and right upper lobe pulmonary emboli. Mediastinum/Nodes: No enlarged mediastinal, hilar, or axillary lymph nodes. Thyroid gland, trachea, and esophagus demonstrate no significant findings. Lungs/Pleura: Fibrotic and interstitial opacities are again seen throughout both lungs, increased compared to 2018. There are new patchy ground-glass opacities in the bilateral upper lobes. There is no pleural effusion or pneumothorax. Trachea and central airways are patent. Musculoskeletal: No chest wall abnormality. No acute or significant osseous findings. Review of the MIP images confirms the above findings. CT ABDOMEN and PELVIS FINDINGS Hepatobiliary: No focal liver abnormality is seen. Status post cholecystectomy. No biliary dilatation. Pancreas: Unremarkable. No pancreatic ductal dilatation or surrounding inflammatory  changes. Spleen: Normal in size without focal abnormality. Adrenals/Urinary Tract: There are subcentimeter cysts in both kidneys. Otherwise, the kidneys, adrenal glands and bladder are within normal limits. Stomach/Bowel: There is a small hiatal hernia. Stomach is otherwise within normal limits. Appendix is not seen. No evidence of bowel wall thickening, distention, or inflammatory changes. There is sigmoid colon diverticulosis. Vascular/Lymphatic: Aortic atherosclerosis. No enlarged abdominal or pelvic lymph nodes. Reproductive: Status post hysterectomy. No adnexal masses. Other: No abdominal wall hernia or abnormality. No abdominopelvic ascites. Musculoskeletal: L1 compression deformity has mildly progressed compared to 2013. Review of the MIP images confirms the above findings. IMPRESSION: 1. Bilateral upper lobe lobar and segmental pulmonary emboli. Positive for acute PE with CTevidence of right heart strain (RV/LV Ratio = 3.2) consistent with at least submassive (intermediate risk) PE. The presence of right heart strain has been associated with an increased risk of morbidity and mortality. 2. New patchy ground-glass opacities in the bilateral upper lobes, likely infectious/inflammatory. 3. Findings compatible with chronic interstitial lung disease, increased compared to 2018. 4. No acute localizing process in the abdomen or pelvis. 5. Sigmoid colon diverticulosis. Aortic Atherosclerosis (ICD10-I70.0). Electronically Signed: By: ARonney AstersM.D. On: 05/27/2022 23:40   DG Chest  Port 1 View  Result Date: 05/23/2022 CLINICAL DATA:  SOB EXAM: PORTABLE CHEST 1 VIEW COMPARISON:  02/24/2021 FINDINGS: Cardiac silhouette is enlarged. Pulmonary interstitial changes consistent with fibrosis similar to the previous examination. No pneumothorax. Calcified aorta. No definite pleural effusions. IMPRESSION: Pulmonary fibrosis.  Prominent cardiac silhouette. Electronically Signed   By: Sammie Bench M.D.   On: 06/01/2022  19:44     Medical Consultants:   None.   Subjective:    [name] she relates her breathing is about the same, she adamantly does not want to go to rehab facility.  Objective:    Vitals:   05/25/22 2344 05/26/22 0305 05/26/22 0311 05/26/22 0843  BP: 118/72 104/75 (!) 101/57 119/70  Pulse: 86 81 (!) 56 85  Resp: '20 20 17 16  '$ Temp: 97.7 F (36.5 C) 98.1 F (36.7 C) 97.7 F (36.5 C) 97.6 F (36.4 C)  TempSrc: Oral Oral Oral Oral  SpO2: 95% 96% 95% 97%  Weight:      Height:       SpO2: 97 % O2 Flow Rate (L/min): 6 L/min   Intake/Output Summary (Last 24 hours) at 05/26/2022 0954 Last data filed at 05/26/2022 0800 Gross per 24 hour  Intake 1045.35 ml  Output 1250 ml  Net -204.65 ml    Filed Weights   06/04/2022 1917  Weight: 79.8 kg    Exam: General exam: In no acute distress, she just finished working with PT and she was tachypneic which is slowly started to improve. Respiratory system: Moderate air movement, with dry crackles bilaterally. Cardiovascular system: S1 & S2 heard, RRR. No JVD. Gastrointestinal system: Abdomen is nondistended, soft and nontender.  Extremities: No pedal edema. Skin: No rashes, lesions or ulcers Psychiatry: Judgement and insight appear normal. Mood & affect appropriate.  Data Reviewed:    Labs: Basic Metabolic Panel: Recent Labs  Lab 05/25/2022 1936 05/30/2022 1951 05/25/22 0109 05/25/22 1508  NA 138 136  --  140  K 3.9 4.2  --  3.4*  CL 97*  --   --  106  CO2 23  --   --  24  GLUCOSE 165*  --   --  122*  BUN 17  --   --  16  CREATININE 1.43*  --   --  1.11*  CALCIUM 9.1  --   --  8.0*  MG  --   --  2.1 2.3  PHOS  --   --  3.6 3.5    GFR Estimated Creatinine Clearance: 35.1 mL/min (A) (by C-G formula based on SCr of 1.11 mg/dL (H)). Liver Function Tests: Recent Labs  Lab 06/07/2022 1936 05/25/22 1508  AST 60* 39  ALT 27 23  ALKPHOS 66 60  BILITOT 1.0 0.9  PROT 5.9* 5.4*  ALBUMIN 3.4* 2.9*    No results  for input(s): "LIPASE", "AMYLASE" in the last 168 hours. No results for input(s): "AMMONIA" in the last 168 hours. Coagulation profile Recent Labs  Lab 05/25/22 0009  INR 1.3*    COVID-19 Labs  No results for input(s): "DDIMER", "FERRITIN", "LDH", "CRP" in the last 72 hours.  Lab Results  Component Value Date   [place] NEGATIVE 06/09/2022    CBC: Recent Labs  Lab 05/23/2022 1936 05/30/2022 1951 05/25/22 0630 05/25/22 1508 05/26/22 0417  WBC 18.0*  --  16.1* 18.8* 22.4*  NEUTROABS 14.0*  --   --   --   --   HGB 15.6* 16.0* 13.0 14.2 13.3  HCT 48.8* 47.0*  39.8 43.1 40.6  MCV 88.7  --  88.2 87.1 87.5  PLT 278  --  196 242 248    Cardiac Enzymes: Recent Labs  Lab 05/25/22 0109  CKTOTAL 136    BNP (last 3 results) No results for input(s): "PROBNP" in the last 8760 hours. CBG: No results for input(s): "GLUCAP" in the last 168 hours. D-Dimer: No results for input(s): "DDIMER" in the last 72 hours. Hgb A1c: No results for input(s): "HGBA1C" in the last 72 hours. Lipid Profile: No results for input(s): "CHOL", "HDL", "LDLCALC", "TRIG", "CHOLHDL", "LDLDIRECT" in the last 72 hours. Thyroid function studies: Recent Labs    05/25/22 0109  TSH 0.513    Anemia work up: No results for input(s): "VITAMINB12", "FOLATE", "FERRITIN", "TIBC", "IRON", "RETICCTPCT" in the last 72 hours. Sepsis Labs: Recent Labs  Lab 06/09/2022 1915 05/28/2022 1936 06/15/2022 2115 05/25/22 0009 05/25/22 0109 05/25/22 0220 05/25/22 0630 05/25/22 1508 05/26/22 0417  PROCALCITON  --   --   --   --  3.58  --   --   --  3.29  WBC  --  18.0*  --   --   --   --  16.1* 18.8* 22.4*  LATICACIDVEN 5.2*  --  2.6* 2.1*  --  1.9  --   --   --     Microbiology Recent Results (from the past 240 hour(s))  Resp panel by RT-PCR (RSV, Flu A&B, Covid) Anterior Nasal Swab     Status: None   Collection Time: 05/23/2022  7:15 PM   Specimen: Anterior Nasal Swab  Result Value Ref Range Status   SARS  Coronavirus 2 by RT PCR NEGATIVE NEGATIVE Final    Comment: (NOTE) SARS-CoV-2 target nucleic acids are NOT DETECTED.  The SARS-CoV-2 RNA is generally detectable in upper respiratory specimens during the acute phase of infection. The lowest concentration of SARS-CoV-2 viral copies this assay can detect is 138 copies/mL. A negative result does not preclude SARS-Cov-2 infection and should not be used as the sole basis for treatment or other patient management decisions. A negative result may occur with  improper specimen collection/handling, submission of specimen other than nasopharyngeal swab, presence of viral mutation(s) within the areas targeted by this assay, and inadequate number of viral copies(<138 copies/mL). A negative result must be combined with clinical observations, patient history, and epidemiological information. The expected result is Negative.  Fact Sheet for Patients:  EntrepreneurPulse.com.au  Fact Sheet for Healthcare Providers:  IncredibleEmployment.be  This test is no t yet approved or cleared by the [place] FDA and  has been authorized for detection and/or diagnosis of SARS-CoV-2 by FDA under an Emergency Use Authorization (EUA). This EUA will remain  in effect (meaning this test can be used) for the duration of the COVID-19 declaration under Section 564(b)(1) of the Act, 21 U.S.C.section 360bbb-3(b)(1), unless the authorization is terminated  or revoked sooner.       Influenza A by PCR NEGATIVE NEGATIVE Final   Influenza B by PCR NEGATIVE NEGATIVE Final    Comment: (NOTE) The Xpert Xpress SARS-CoV-2/FLU/RSV plus assay is intended as an aid in the diagnosis of influenza from Nasopharyngeal swab specimens and should not be used as a sole basis for treatment. Nasal washings and aspirates are unacceptable for Xpert Xpress SARS-CoV-2/FLU/RSV testing.  Fact Sheet for  Patients: EntrepreneurPulse.com.au  Fact Sheet for Healthcare Providers: IncredibleEmployment.be  This test is not yet approved or cleared by the [place] FDA and has been authorized for detection  and/or diagnosis of SARS-CoV-2 by FDA under an Emergency Use Authorization (EUA). This EUA will remain in effect (meaning this test can be used) for the duration of the COVID-19 declaration under Section 564(b)(1) of the Act, 21 U.S.C. section 360bbb-3(b)(1), unless the authorization is terminated or revoked.     Resp Syncytial Virus by PCR NEGATIVE NEGATIVE Final    Comment: (NOTE) Fact Sheet for Patients: EntrepreneurPulse.com.au  Fact Sheet for Healthcare Providers: IncredibleEmployment.be  This test is not yet approved or cleared by the [place] FDA and has been authorized for detection and/or diagnosis of SARS-CoV-2 by FDA under an Emergency Use Authorization (EUA). This EUA will remain in effect (meaning this test can be used) for the duration of the COVID-19 declaration under Section 564(b)(1) of the Act, 21 U.S.C. section 360bbb-3(b)(1), unless the authorization is terminated or revoked.  Performed at [facility], [place] [address], [place], [place] [address]   Blood culture (routine x 2)     Status: None (Preliminary result)   Collection Time: 05/25/2022  7:36 PM   Specimen: BLOOD  Result Value Ref Range Status   Specimen Description BLOOD RIGHT ANTECUBITAL  Final   Special Requests   Final    BOTTLES DRAWN AEROBIC AND ANAEROBIC Blood Culture adequate volume   Culture   Final    NO GROWTH < 24 HOURS Performed at [facility], [place] [address], [place], [place] [address]    Report Status PENDING  Incomplete  Blood culture (routine x 2)     Status: None (Preliminary result)   Collection Time: 06/05/2022  7:53 PM   Specimen: BLOOD  Result Value Ref Range Status   Specimen Description  BLOOD LEFT ANTECUBITAL  Final   Special Requests   Final    BOTTLES DRAWN AEROBIC AND ANAEROBIC Blood Culture results may not be optimal due to an inadequate volume of blood received in culture bottles   Culture   Final    NO GROWTH < 24 HOURS Performed at [facility], [place] [address], [place], [place] [address]    Report Status PENDING  Incomplete  Respiratory (~20 pathogens) panel by PCR     Status: None   Collection Time: 05/25/22  1:12 AM   Specimen: Nasopharyngeal Swab; Respiratory  Result Value Ref Range Status   Adenovirus NOT DETECTED NOT DETECTED Final   Coronavirus 229E NOT DETECTED NOT DETECTED Final    Comment: (NOTE) The Coronavirus on the Respiratory Panel, DOES NOT test for the novel  Coronavirus (2019 nCoV)    Coronavirus HKU1 NOT DETECTED NOT DETECTED Final   Coronavirus NL63 NOT DETECTED NOT DETECTED Final   Coronavirus OC43 NOT DETECTED NOT DETECTED Final   Metapneumovirus NOT DETECTED NOT DETECTED Final   Rhinovirus / Enterovirus NOT DETECTED NOT DETECTED Final   Influenza A NOT DETECTED NOT DETECTED Final   Influenza B NOT DETECTED NOT DETECTED Final   Parainfluenza Virus 1 NOT DETECTED NOT DETECTED Final   Parainfluenza Virus 2 NOT DETECTED NOT DETECTED Final   Parainfluenza Virus 3 NOT DETECTED NOT DETECTED Final   Parainfluenza Virus 4 NOT DETECTED NOT DETECTED Final   Respiratory Syncytial Virus NOT DETECTED NOT DETECTED Final   Bordetella pertussis NOT DETECTED NOT DETECTED Final   Bordetella Parapertussis NOT DETECTED NOT DETECTED Final   Chlamydophila pneumoniae NOT DETECTED NOT DETECTED Final   Mycoplasma pneumoniae NOT DETECTED NOT DETECTED Final    Comment: Performed at [facility], [place]. [address],  [place], [place] [address]     Medications:    hydrocortisone sod succinate (SOLU-CORTEF) inj  100 mg Intravenous Q8H   pantoprazole  40 mg Oral Daily   predniSONE  10 mg Oral Q breakfast   Continuous Infusions:  azithromycin 500 mg  (05/26/22 0120)   cefTRIAXone (ROCEPHIN)  IV 2 g (05/25/22 2242)   heparin 950 Units/hr (05/25/22 1905)      LOS: 2 days   Charlynne Cousins  [facility]  05/26/2022, 9:54 AM

## 2022-05-26 NOTE — Progress Notes (Signed)
Mobility Specialist Progress Note:   05/26/22 1002  Mobility  Activity Ambulated with assistance to bathroom  Level of Assistance Contact guard assist, steadying assist  Assistive Device Front wheel walker  Distance Ambulated (ft) 30 ft  Activity Response Tolerated well  $Mobility charge 1 Mobility   Pt in bed willing to participate in mobility. No complaints of pain. Left in bed with call bell in reach and all needs met.   Gareth [employer] Mobility Specialist Please contact via [employer] or  Rehab Office at [phone]

## 2022-05-27 DIAGNOSIS — K922 Gastrointestinal hemorrhage, unspecified: Secondary | ICD-10-CM | POA: Diagnosis not present

## 2022-05-27 DIAGNOSIS — J8410 Pulmonary fibrosis, unspecified: Secondary | ICD-10-CM | POA: Diagnosis not present

## 2022-05-27 DIAGNOSIS — J189 Pneumonia, unspecified organism: Secondary | ICD-10-CM | POA: Diagnosis not present

## 2022-05-27 DIAGNOSIS — R7989 Other specified abnormal findings of blood chemistry: Secondary | ICD-10-CM | POA: Diagnosis not present

## 2022-05-27 DIAGNOSIS — K625 Hemorrhage of anus and rectum: Secondary | ICD-10-CM

## 2022-05-27 DIAGNOSIS — I2699 Other pulmonary embolism without acute cor pulmonale: Secondary | ICD-10-CM | POA: Diagnosis not present

## 2022-05-27 LAB — BASIC METABOLIC PANEL
Anion gap: 10 (ref 5–15)
BUN: 16 mg/dL (ref 8–23)
CO2: 24 mmol/L (ref 22–32)
Calcium: 8.2 mg/dL — ABNORMAL LOW (ref 8.9–10.3)
Chloride: 108 mmol/L (ref 98–111)
Creatinine, Ser: 1.04 mg/dL — ABNORMAL HIGH (ref 0.44–1.00)
GFR, Estimated: 52 mL/min — ABNORMAL LOW (ref >60)
Glucose, Bld: 104 mg/dL — ABNORMAL HIGH (ref 70–99)
Potassium: 3.3 mmol/L — ABNORMAL LOW (ref 3.5–5.1)
Sodium: 142 mmol/L (ref 135–145)

## 2022-05-27 LAB — CBC
HCT: 38.8 % (ref 36.0–46.0)
HCT: 39.7 % (ref 36.0–46.0)
Hemoglobin: 13 g/dL (ref 12.0–15.0)
Hemoglobin: 12.9 g/dL (ref 12.0–15.0)
MCH: 28.8 pg (ref 26.0–34.0)
MCH: 28.7 pg (ref 26.0–34.0)
MCHC: 33.5 g/dL (ref 30.0–36.0)
MCHC: 32.5 g/dL (ref 30.0–36.0)
MCV: 86 fL (ref 80.0–100.0)
MCV: 88.4 fL (ref 80.0–100.0)
Platelets: 279 10*3/uL (ref 150–400)
Platelets: 282 10*3/uL (ref 150–400)
RBC: 4.51 MIL/uL (ref 3.87–5.11)
RBC: 4.49 MIL/uL (ref 3.87–5.11)
RDW: 13.6 % (ref 11.5–15.5)
RDW: 13.7 % (ref 11.5–15.5)
WBC: 16.2 10*3/uL — ABNORMAL HIGH (ref 4.0–10.5)
WBC: 14.3 10*3/uL — ABNORMAL HIGH (ref 4.0–10.5)
nRBC: 0.1 % (ref 0.0–0.2)
nRBC: 0.1 % (ref 0.0–0.2)

## 2022-05-27 LAB — HEPARIN LEVEL (UNFRACTIONATED): Heparin Unfractionated: 0.24 IU/mL — ABNORMAL LOW (ref 0.30–0.70)

## 2022-05-27 MED ORDER — POTASSIUM CHLORIDE 20 MEQ PO PACK
20.0000 meq | PACK | Freq: Two times a day (BID) | ORAL | Status: AC
  Administered 2022-05-27 (×2): 20 meq via ORAL
  Filled 2022-05-27 (×2): qty 1

## 2022-05-27 NOTE — Consult Note (Addendum)
Referring Provider: Dr. Olevia Bowens, [facility] Primary Care Physician:  Deland Pretty, MD Primary Gastroenterologist:  Dr. Sharlett Iles  Reason for Consultation: GI bleed on heparin  HPI: Veronica Stone is a 89 y.o. female past medical history significant for pulmonary fibrosis on 4 L of oxygen at home on steroids, rheumatoid arthritis for which she is on Remicade, history of AAA, gastric ulcer in 2011, ? history of DVT in the remote past who presented to [facility] ED with shortness of breath that started about 2 weeks prior to admission.  She was cyanotic on arrival satting 80% in the ED placed on 12 L of oxygen, CT angio of the chest showed bilateral PE, new patchy groundglass opacity bilaterally.   Started on IV heparin.  Had bloody BM this AM x 2.  Per nursing staff, red blood mixed with liquid/soft stool.  Patient says that prior to this hospitalization she was moving her bowels well after drinking her coffee each morning.  She on occasion would see a small amount of bright red blood on the toilet paper from straining if she had a hard stool, but says that rarely ever occurred.  No abdominal pain.  No endoscopic procedures in over 10 years.  History of GU in 2011 that was healing at that time.  Past Medical History:  Diagnosis Date   Colon, diverticulosis Nov 2007   Hyperlipemia    Hypertension    IBS (irritable bowel syndrome)    Irregular heart rate    Personal history of colonic polyps 04/10/2006   hyperplastic   Pulmonary fibrosis (HCC)    Rheumatoid arthritis(714.0)     Past Surgical History:  Procedure Laterality Date   ABDOMINAL HYSTERECTOMY     APPENDECTOMY     CHOLECYSTECTOMY     CYSTECTOMY     left breast    Prior to Admission medications   Medication Sig Start Date End Date Taking? Authorizing Provider  cholecalciferol (VITAMIN D) 1000 units tablet Take 1,000 Units by mouth daily.   Yes [provider]  furosemide (LASIX) 20 MG tablet Take 1 tablet by mouth  daily as needed for edema. 10/02/10  Yes [provider]  HYDROcodone-acetaminophen (NORCO/VICODIN) 5-325 MG tablet Take 1 tablet by mouth in the morning, at noon, and at bedtime.   Yes [provider]  irbesartan (AVAPRO) 300 MG tablet Take 300 mg by mouth daily.   Yes [provider]  loratadine (CLARITIN) 10 MG tablet Take 10 mg by mouth daily as needed for allergies.   Yes [provider]  Polyethyl Glycol-Propyl Glycol 0.4-0.3 % SOLN Place 1 drop into both eyes every other day. For dry eyes   Yes [provider]  predniSONE (DELTASONE) 10 MG tablet TAKE 1 TABLET(10 MG) BY MOUTH DAILY WITH BREAKFAST Patient taking differently: Take 10 mg by mouth daily with breakfast. 05/11/21  Yes Rigoberto Noel, MD  RiTUXimab (RITUXAN IV) Inject 2 application into the vein once. TAKES 2 DOSES IN ONE MONTH TIME, THEN DOESN'T TAKEN AGAIN FOR 6 MONTHS   Yes [provider]    Current Facility-Administered Medications  Medication Dose Route Frequency Provider Last Rate Last Admin   acetaminophen (TYLENOL) tablet 650 mg  650 mg Oral Q6H PRN Doutova, Anastassia, MD       Or   acetaminophen (TYLENOL) suppository 650 mg  650 mg Rectal Q6H PRN Doutova, Anastassia, MD       azithromycin (ZITHROMAX) 500 mg in sodium chloride 0.9 % 250 mL IVPB  500 mg Intravenous Q24H Toy Baker, MD 250 mL/hr at 05/26/22 2306 500 mg at 05/26/22 2306   cefTRIAXone (ROCEPHIN) 2 g in sodium chloride 0.9 % 100 mL IVPB  2 g Intravenous Q24H Charlynne Cousins, MD 200 mL/hr at 05/26/22 2115 2 g at 05/26/22 2115   hydrocortisone sodium succinate (SOLU-CORTEF) 100 MG injection 50 mg  50 mg Intravenous Q12H Charlynne Cousins, MD   50 mg at 05/26/22 2110   loratadine (CLARITIN) tablet 10 mg  10 mg Oral Daily PRN Toy Baker, MD       pantoprazole (PROTONIX) EC tablet 40 mg  40 mg Oral BID Charlynne Cousins, MD   40 mg at 05/27/22 0946   predniSONE (DELTASONE) tablet 10  mg  10 mg Oral Q breakfast Toy Baker, MD   10 mg at 05/27/22 0946    Allergies as of 06/06/2022 - Review Complete 05/31/2022  Allergen Reaction Noted   Dilaudid [hydromorphone hcl] Nausea And Vomiting 12/09/2011   Penicillins Itching and Rash 07/25/2014   Statins Itching 01/12/2009    Family History  Problem Relation Age of Onset   Heart disease Brother    Heart disease Sister    Liver disease Daughter    Diabetes Brother    Rheum arthritis Mother     Social History   Socioeconomic History   Marital status: Divorced    Spouse name: Not on file   Number of children: 5   Years of education: Not on file   Highest education level: Not on file  Occupational History   Occupation: Retired  Tobacco Use   Smoking status: Former    Packs/day: 1.00    Years: 30.00    Total pack years: 30.00    Types: Cigarettes    Quit date: 05/16/1988    Years since quitting: 34.0   Smokeless tobacco: Never  Vaping Use   Vaping Use: Never used  Substance and Sexual Activity   Alcohol use: No   Drug use: No   Sexual activity: Not on file  Other Topics Concern   Not on file  Social History Narrative   Daily Caffeine Use 2 cups   Pt does not get regular exercise   Social Determinants of Health   Financial Resource Strain: Not on file  Food Insecurity: Not on file  Transportation Needs: Not on file  Physical Activity: Not on file  Stress: Not on file  Social Connections: Not on file  Intimate Partner Violence: Not on file    Review of Systems: ROS is O/W negative except as mentioned in HPI.  Physical Exam: Vital signs in last 24 hours: Temp:  [97.5 F (36.4 C)-98.3 F (36.8 C)] 97.9 F (36.6 C) (01/12 0747) Pulse Rate:  [73-94] 73 (01/12 0747) Resp:  [17-25] 22 (01/12 0747) BP: (118-134)/(76-83) 134/81 (01/12 0747) SpO2:  [96 %-99 %] 99 % (01/12 0747) Last BM Date : 05/27/22 General:  Alert, elderly, chronically ill-appearing, pleasant and cooperative in NAD Head:   Normocephalic and atraumatic. Eyes:  Sclera clear, no icterus.  Conjunctiva pink. Ears:  Normal auditory acuity. Mouth:  No deformity or lesions.   Lungs:  Clear throughout to auscultation.  No wheezes, crackles, or rhonchi.  Heart:  Regular rate and rhythm; no murmurs, clicks, rubs,or gallops. Abdomen:  Soft, non-distended.  BS present.  Non-tender.    Msk:  Symmetrical without gross deformities. Pulses:  Normal pulses noted. Extremities:  Without clubbing or edema. Neurologic:  Alert and oriented x 4;  grossly normal neurologically. Skin:  Intact without significant lesions or rashes. Psych:  Alert and cooperative. Normal mood and affect.  Intake/Output from previous day: 01/11 0701 - 01/12 0700 In: 566.2 [P.O.:480; I.V.:86.2] Out: -  Intake/Output this shift: Total I/O In: 120 [P.O.:120] Out: -   Lab Results: Recent Labs    05/26/22 0417 05/27/22 0121 05/27/22 1033  WBC 22.4* 16.2* 14.3*  HGB 13.3 13.0 12.9  HCT 40.6 38.8 39.7  PLT 248 279 282   BMET Recent Labs    05/28/2022 1936 05/19/2022 1951 05/25/22 1508  NA 138 136 140  K 3.9 4.2 3.4*  CL 97*  --  106  CO2 23  --  24  GLUCOSE 165*  --  122*  BUN 17  --  16  CREATININE 1.43*  --  1.11*  CALCIUM 9.1  --  8.0*   LFT Recent Labs    05/25/22 1508  PROT 5.4*  ALBUMIN 2.9*  AST 39  ALT 23  ALKPHOS 60  BILITOT 0.9   PT/INR Recent Labs    05/25/22 0009  LABPROT 15.7*  INR 1.3*   Studies/Results: VAS [place] LOWER EXTREMITY VENOUS (DVT)  Result Date: 05/25/2022  Lower Venous DVT Study Patient Name:  KITTY CADAVID  Date of Exam:   05/25/2022 Medical Rec #: [id number]         Accession #:    [id number] Date of Birth: 10-14-1931         Patient Gender: F Patient Age:   59 years Exam Location:  [facility] Procedure:      VAS [place] LOWER EXTREMITY VENOUS (DVT) Referring Phys: Nyoka Lint DOUTOVA --------------------------------------------------------------------------------  Indications: Pulmonary  embolism.  Anticoagulation: Heparin. Comparison Study: No prior studies. Performing Technologist: Darlin Coco RDMS, RVT  Examination Guidelines: A complete evaluation includes B-mode imaging, spectral Doppler, color Doppler, and power Doppler as needed of all accessible portions of each vessel. Bilateral testing is considered an integral part of a complete examination. Limited examinations for reoccurring indications may be performed as noted. The reflux portion of the exam is performed with the patient in reverse Trendelenburg.  +---------+---------------+---------+-----------+----------+--------------+ RIGHT    CompressibilityPhasicitySpontaneityPropertiesThrombus Aging +---------+---------------+---------+-----------+----------+--------------+ CFV      Full           Yes      Yes                                 +---------+---------------+---------+-----------+----------+--------------+ SFJ      Full                                                        +---------+---------------+---------+-----------+----------+--------------+ FV Prox  Full                                                        +---------+---------------+---------+-----------+----------+--------------+ FV Mid   Full                                                        +---------+---------------+---------+-----------+----------+--------------+  FV DistalFull                                                        +---------+---------------+---------+-----------+----------+--------------+ PFV      Full                                                        +---------+---------------+---------+-----------+----------+--------------+ POP      Full           Yes      Yes                                 +---------+---------------+---------+-----------+----------+--------------+ PTV      Full                                                         +---------+---------------+---------+-----------+----------+--------------+ PERO     Full                                                        +---------+---------------+---------+-----------+----------+--------------+ Gastroc  Partial        Yes      Yes                  Acute          +---------+---------------+---------+-----------+----------+--------------+   +---------+---------------+---------+-----------+----------+--------------+ LEFT     CompressibilityPhasicitySpontaneityPropertiesThrombus Aging +---------+---------------+---------+-----------+----------+--------------+ CFV      Full           Yes      Yes                                 +---------+---------------+---------+-----------+----------+--------------+ SFJ      Full                                                        +---------+---------------+---------+-----------+----------+--------------+ FV Prox  Full                                                        +---------+---------------+---------+-----------+----------+--------------+ FV Mid   Full                                                        +---------+---------------+---------+-----------+----------+--------------+  FV DistalFull                                                        +---------+---------------+---------+-----------+----------+--------------+ PFV      Full                                                        +---------+---------------+---------+-----------+----------+--------------+ POP      Full           Yes      Yes                                 +---------+---------------+---------+-----------+----------+--------------+ PTV      Full                                                        +---------+---------------+---------+-----------+----------+--------------+ PERO     None           No       No                   Acute           +---------+---------------+---------+-----------+----------+--------------+ Gastroc  Full                                                        +---------+---------------+---------+-----------+----------+--------------+     Summary: RIGHT: - Findings consistent with acute deep vein thrombosis involving the right gastrocnemius veins. - No cystic structure found in the popliteal fossa.  LEFT: - Findings consistent with acute deep vein thrombosis involving the left peroneal veins. - No cystic structure found in the popliteal fossa.  *See table(s) above for measurements and observations. Electronically signed by Servando Snare MD on 05/25/2022 at 5:24:19 PM.    Final    ECHOCARDIOGRAM COMPLETE  Result Date: 05/25/2022    ECHOCARDIOGRAM REPORT   Patient Name:   Danyel Tobey Date of Exam: 05/25/2022 Medical Rec #:  [id number]        Height:       63.0 in Accession #:    [id number]       Weight:       175.9 lb Date of Birth:  Jan 04, 1932        BSA:          1.831 m Patient Age:    46 years         BP:           103/69 mmHg Patient Gender: F                HR:           87 bpm. Exam Location:  Inpatient Procedure: 2D Echo, Cardiac Doppler and Color Doppler Indications:    I26.02 Pulmonary embolus  History:        Patient has prior history of Echocardiogram examinations, most                 recent 12/06/2005. Signs/Symptoms:Shortness of Breath, Dyspnea,                 Chest Pain and Bacteremia; Risk Factors:Hypertension and Former                 Smoker.  Sonographer:    Roseanna Rainbow RDCS Referring Phys: Toy Baker  Sonographer Comments: Technically difficult study due to poor echo windows. IMPRESSIONS  1. Left ventricular ejection fraction, by estimation, is 65 to 70%. The left ventricle has normal function. The left ventricle has no regional wall motion abnormalities. There is mild left ventricular hypertrophy. Left ventricular diastolic parameters are consistent with Grade I diastolic dysfunction  (impaired relaxation). There is the interventricular septum is flattened in systole, consistent with right ventricular pressure overload.  2. Right ventricular systolic function is moderately reduced. The right ventricular size is severely enlarged. There is moderately elevated pulmonary artery systolic pressure. The estimated right ventricular systolic pressure is 91.4 mmHg.  3. Tricuspid valve regurgitation is severe.  4. The mitral valve is grossly normal. Trivial mitral valve regurgitation. No evidence of mitral stenosis.  5. The aortic valve is grossly normal. There is mild calcification of the aortic valve. Aortic valve regurgitation is not visualized. No aortic stenosis is present.  6. A small pericardial effusion is present. The pericardial effusion is posterior to the left ventricle.  7. The inferior vena cava is normal in size with greater than 50% respiratory variability, suggesting right atrial pressure of 3 mmHg.  8. Right atrial size was mildly dilated. FINDINGS  Left Ventricle: Left ventricular ejection fraction, by estimation, is 65 to 70%. The left ventricle has normal function. The left ventricle has no regional wall motion abnormalities. The left ventricular internal cavity size was normal in size. There is  mild left ventricular hypertrophy. The interventricular septum is flattened in systole, consistent with right ventricular pressure overload. Left ventricular diastolic parameters are consistent with Grade I diastolic dysfunction (impaired relaxation). Right Ventricle: The right ventricular size is severely enlarged. No increase in right ventricular wall thickness. Right ventricular systolic function is moderately reduced. There is moderately elevated pulmonary artery systolic pressure. The tricuspid regurgitant velocity is 3.31 m/s, and with an assumed right atrial pressure of 15 mmHg, the estimated right ventricular systolic pressure is 78.2 mmHg. Left Atrium: Left atrial size was normal in  size. Right Atrium: Right atrial size was mildly dilated. Pericardium: A small pericardial effusion is present. The pericardial effusion is posterior to the left ventricle. Presence of epicardial fat layer. Mitral Valve: The mitral valve is grossly normal. Trivial mitral valve regurgitation. No evidence of mitral valve stenosis. Tricuspid Valve: The tricuspid valve is normal in structure. Tricuspid valve regurgitation is severe. No evidence of tricuspid stenosis. Aortic Valve: The aortic valve is grossly normal. There is mild calcification of the aortic valve. Aortic valve regurgitation is not visualized. No aortic stenosis is present. Pulmonic Valve: The pulmonic valve was normal in structure. Pulmonic valve regurgitation is mild. No evidence of pulmonic stenosis. Aorta: The aortic root is normal in size and structure. Ascending aorta measurements are within normal limits for age when indexed to body surface area. Venous: The inferior vena cava is normal in size with greater than 50% respiratory variability, suggesting right atrial pressure of 3 mmHg. IAS/Shunts: No atrial level shunt  detected by color flow Doppler.  LEFT VENTRICLE PLAX 2D LVIDd:         3.30 cm   Diastology LVIDs:         1.80 cm   LV e' medial:    2.94 cm/s LV PW:         1.00 cm   LV E/e' medial:  21.7 LV IVS:        1.10 cm   LV e' lateral:   5.35 cm/s LVOT diam:     2.20 cm   LV E/e' lateral: 11.9 LV SV:         66 LV SV Index:   36 LVOT Area:     3.80 cm  RIGHT VENTRICLE             IVC RV S prime:     12.10 cm/s  IVC diam: 2.25 cm RVOT diam:      2.30 cm TAPSE (M-mode): 1.3 cm LEFT ATRIUM             Index       RIGHT ATRIUM           Index LA diam:        2.70 cm 1.47 cm/m  RA Area:     18.20 cm LA Vol (A2C):   17.7 ml 9.67 ml/m  RA Volume:   52.80 ml  28.84 ml/m LA Vol (A4C):   17.9 ml 9.78 ml/m LA Biplane Vol: 17.9 ml 9.78 ml/m  AORTIC VALVE LVOT Vmax:   111.00 cm/s LVOT Vmean:  71.900 cm/s LVOT VTI:    0.173 m  AORTA Ao Root diam:  3.30 cm Ao Asc diam:  3.80 cm MITRAL VALVE                TRICUSPID VALVE MV Area (PHT): 3.99 cm     TR Peak grad:   43.8 mmHg MV Decel Time: 190 msec     TR Vmax:        331.00 cm/s MV E velocity: 63.90 cm/s MV A velocity: 111.00 cm/s  SHUNTS MV E/A ratio:  0.58         Systemic VTI:  0.17 m                             Systemic Diam: 2.20 cm                             Pulmonic Diam: 2.30 cm Cherlynn Kaiser MD Electronically signed by Cherlynn Kaiser MD Signature Date/Time: 05/25/2022/3:36:05 PM    Final     IMPRESSION:  *Bilateral PE/DVTs:  ? Unprovoked.  On heparin, but developed bleeding this AM so it is on hold.  On 6 Liters O2 currently (uses 4 Liters at home). *GI bleed:  Red blood mixed with stool x 2 today.  Hemoglobin still normal at 12.9 g, but overall down from 15-16 grams 3 days ago (likely somewhat dilutional drop).  Differential includes diverticular, hemorrhoidal, malignancy (especially given unprovoked PEs/DVTs). *Pulmonary fibrosis on 4 Liters of O2 at home  PLAN: -Trend CBC/Hgb. -Pantoprazole 40 mg BID. -? Colonoscopy and timing with her breathing status.  Likely need to risk stratify in order to resume heparin/anticoagulation.  Laban Emperor. Zehr  05/27/2022, 10:56 AM    Attending physician's note   I have taken history, reviewed the chart and examined the patient. I performed a  substantive portion of this encounter, including complete performance of at least one of the key components, in conjunction with the APP. I agree with the Advanced Practitioner's note, impression and recommendations.   Painless hematochezia on heparin for new onset B/L PE. Neg CTA except for div. Hb stable 13. Prev colon over 10 yrs ago by Dr. Deatra Ina. ILD on home O2 4 lit/day, currently on 6 L [place] H/O PUD  Plan: -Trend CBC -If any active bleeding, stat CTA. It +, IR embolization -Protonix 40 BID -She would require long-term AC.  Needs EGD/colon prior. Likely 1/14 depending upon pulm status.  She  believes she cannot drink prep today but will be able to tomorrow.  Dr. Benson Norway taking over service tomorrow.   Carmell Austria, MD [facility] [phone]

## 2022-05-27 NOTE — Evaluation (Signed)
Physical Therapy Evaluation Patient Details Name: Veronica Stone MRN: [id number] DOB: May 22, 1931 Today's Date: 05/27/2022  History of Present Illness  Pt is an 89 y.o. female who presented 06/02/2022 with increasing SOB, chills, & N/V x2 weeks. Pt admitted with bil upper lobe PE and bil lower extremity DVTs and possible sepsis. PMH: pulmonary fibrosis hypertension, rheumatoid arthritis, COPD on chronic oxygen up to 4 L, history of AAA, gastric ulcer, history of DVT, HLD, HTN   Clinical Impression  Pt presents with condition above and deficits mentioned below, see PT Problem List. PTA, she was mod I, intermittently ambulating without AD and intermittently with a rollator vs cane. She endorses x1 fall in the past 6 months. She lives alone in a 1-level house with 2-4 STE and relies on her daughter for transportation, heavy household chores, and meals. Currently, pt is demonstrating deficits in aerobic endurance, balance, power, and overall strength. She was only able to ambulate up to ~24 ft with a RW at a min guard assist level before fatiguing. Her SpO2 levels varied greatly from 70s-90s%, even on 6L supplemental O2. Pt satting in 90s% on 6L supine in bed end of session. Pt recommended to use her rollator and have family support initially upon d/c. She verbalized understanding. Recommending follow-up with HHPT as pt does have support available upon d/c. Will continue to follow acutely.       Recommendations for follow up therapy are one component of a multi-disciplinary discharge planning process, led by the attending physician.  Recommendations may be updated based on patient status, additional functional criteria and insurance authorization.  Follow Up Recommendations Home health PT      Assistance Recommended at Discharge Frequent or constant Supervision/Assistance (initially)  Patient can return home with the following  A little help with walking and/or transfers;A little help with  bathing/dressing/bathroom;Assistance with cooking/housework;Assist for transportation;Help with stairs or ramp for entrance    Equipment Recommendations None recommended by PT  Recommendations for Other Services       Functional Status Assessment Patient has had a recent decline in their functional status and demonstrates the ability to make significant improvements in function in a reasonable and predictable amount of time.     Precautions / Restrictions Precautions Precautions: Fall;Other (comment) Precaution Comments: watch SpO2 Restrictions Weight Bearing Restrictions: No      Mobility  Bed Mobility Overal bed mobility: Needs Assistance Bed Mobility: Supine to Sit, Sit to Supine     Supine to sit: Min guard, HOB elevated Sit to supine: Min assist, HOB elevated   General bed mobility comments: Min guard assist for safety coming to sit R EOB using bed rails with HOB elevated, extra time. MinA to lift legs back onto bed to return to supine.    Transfers Overall transfer level: Needs assistance Equipment used: Rolling walker (2 wheels) Transfers: Sit to/from Stand Sit to Stand: Min guard           General transfer comment: Extra time and effort but able to stand from EOB to RW, pulling up on RW, with min guard assist and no LOB    Ambulation/Gait Ambulation/Gait assistance: Min guard Gait Distance (Feet): 24 Feet Assistive device: Rolling walker (2 wheels) Gait Pattern/deviations: Step-through pattern, Decreased stride length, Trunk flexed Gait velocity: reduced Gait velocity interpretation: <1.31 ft/sec, indicative of household ambulator   General Gait Details: Pt with slow, mildly unsteady gait but no LOB, min guard for safety  Stairs  Wheelchair Mobility    Modified Rankin (Stroke Patients Only)       Balance Overall balance assessment: Needs assistance Sitting-balance support: No upper extremity supported, Feet supported Sitting  balance-Leahy Scale: Good     Standing balance support: Bilateral upper extremity supported, During functional activity, Reliant on assistive device for balance Standing balance-Leahy Scale: Poor Standing balance comment: Reliant on RW                             Pertinent Vitals/Pain Pain Assessment Pain Assessment: Faces Faces Pain Scale: No hurt Pain Intervention(s): Monitored during session    Home Living Family/patient expects to be discharged to:: Private residence Living Arrangements: Alone Available Help at Discharge: Family;Available 24 hours/day Type of Home: House Home Access: Stairs to enter Entrance Stairs-Rails: Right;Can reach both;Left (ascending on x2 STE in back; bil rails on x4 STE in front) Entrance Stairs-Number of Steps: 2-4   Home Layout: One level Home Equipment: Grab bars - tub/shower;Shower seat;Cane - single point;Rollator (4 wheels);Electric scooter      Prior Function Prior Level of Function : Needs assist       Physical Assist : ADLs (physical)   ADLs (physical): IADLs Mobility Comments: Pt mod I using rollator vs cane intermittently, sometimes no UE support for household mobility. x1 fall in past 6 months. ADLs Comments: Relies on daughter for transportation. Daughter provides pt with groceries and meals and will perform heavier household chores as needed, otherwise pt is mod I.     Hand Dominance        Extremity/Trunk Assessment   Upper Extremity Assessment Upper Extremity Assessment: Defer to OT evaluation    Lower Extremity Assessment Lower Extremity Assessment: Generalized weakness (bil edema)    Cervical / Trunk Assessment Cervical / Trunk Assessment: Other exceptions Cervical / Trunk Exceptions: increased body habitus  Communication   Communication: No difficulties  Cognition Arousal/Alertness: Awake/alert Behavior During Therapy: WFL for tasks assessed/performed Overall Cognitive Status: Within Functional  Limits for tasks assessed                                          General Comments General comments (skin integrity, edema, etc.): SpO2 varying as low as 70%, even when increased to 6L (started on RA to assess O2 needs), then would rebound to >/= 90% when cued for breathing when pt was resting; educated pt to use her rollator at d/c and have family support initially, she verbalized understanding    Exercises     Assessment/Plan    PT Assessment Patient needs continued PT services  PT Problem List Decreased strength;Decreased activity tolerance;Decreased balance;Decreased mobility;Cardiopulmonary status limiting activity       PT Treatment Interventions DME instruction;Functional mobility training;Gait training;Stair training;Therapeutic activities;Therapeutic exercise;Balance training;Neuromuscular re-education;Patient/family education    PT Goals (Current goals can be found in the Care Plan section)  Acute Rehab PT Goals Patient Stated Goal: to go home PT Goal Formulation: With patient Time For Goal Achievement: 06/10/22 Potential to Achieve Goals: Good    Frequency Min 3X/week     Co-evaluation               AM-PAC PT "6 Clicks" Mobility  Outcome Measure Help needed turning from your back to your side while in a flat bed without using bedrails?: A Little Help needed moving from lying on  your back to sitting on the side of a flat bed without using bedrails?: A Little Help needed moving to and from a bed to a chair (including a wheelchair)?: A Little Help needed standing up from a chair using your arms (e.g., wheelchair or bedside chair)?: A Little Help needed to walk in hospital room?: A Little Help needed climbing 3-5 steps with a railing? : A Lot 6 Click Score: 17    End of Session Equipment Utilized During Treatment: Oxygen Activity Tolerance: Patient tolerated treatment well;Patient limited by fatigue Patient left: in bed;with call bell/phone  within reach;with bed alarm set Nurse Communication: Mobility status;Other (comment) (sats) PT Visit Diagnosis: Unsteadiness on feet (R26.81);Other abnormalities of gait and mobility (R26.89);Muscle weakness (generalized) (M62.81);Difficulty in walking, not elsewhere classified (R26.2)    Time: 2458-0998 PT Time Calculation (min) (ACUTE ONLY): 27 min   Charges:   PT Evaluation $PT Eval Moderate Complexity: 1 Mod PT Treatments $Therapeutic Activity: 8-22 mins        Moishe Spice, PT, DPT Acute Rehabilitation Services  Office: [phone]   Orvan Falconer 05/27/2022, 9:09 AM

## 2022-05-27 NOTE — Progress Notes (Addendum)
[facility] PROGRESS NOTE    Progress Note  Veronica Stone  QMV:[id number] DOB: 1931-08-16 DOA: 05/27/2022 PCP: Deland Pretty, MD     Brief Narrative:   Veronica Stone is an 89 y.o. female past medical history significant for pulmonary fibrosis on 4 L of oxygen at home on steroids, there is probably due to rheumatoid arthritis for which she is on Remicade, history of AAA, gastric ulcer, history of DVT comes in with shortness of breath that started about 2 weeks prior to admission cyanotic on arrival satting 80% in the ED placed on 12 L of oxygen, CT angio of the chest showed bilateral PE, new patchy groundglass opacity bilaterally.  Assessment/Plan:   Acute on chronic respiratory failure with hypoxia due to PE: Lower extremity Doppler showed bilateral DVT. Community-acquired pneumonia has been ruled out. 2D echo showed 28% grade 1 diastolic heart failure. Respiratory panel is negative. PT eval is in pending. Was on IV heparin started having lower GI bleed this morning. Heparin has been stopped CBC has been ordered. We have consulted GI for possible colonoscopy although she may not be a candidate due to pulmonary fibrosis. Pulmonary is following.  New acute lower GI bleed: She related bright red blood per rectum in her stool this morning. Heparin has been stopped, CBC is pending this morning. Will consult gastroenterology.  History of rheumatoid arthritis: Continue Remicade and prednisone.  Hypotension: Resolved Culture sent on 06/01/2022 has been negative till date. Completed 5-day course of Rocephin.  Acute kidney injury: Likely prerenal azotemia with a baseline creatinine of less than 1 on admission 1.4. It is improving basic metabolic panels pending this morning.  Chronic diastolic heart failure: Continue to hold diuretic therapy, he appears euvolemic.  Elevated troponins: She denies any chest pain likely demand ischemia. 2D echo is no WMA. Twelve-lead EKG  showed no signs of ischemia.  Essential hypertension: Continue hold diuretic therapy and ARB. KVO IV fluids.  Hypokalemia: Repleted orally now resolved.    DVT prophylaxis: Holding anticoagulation due to GI bleed Family Communication:none Status is: Inpatient Remains inpatient appropriate because: Acute on chronic respiratory failure with hypoxia due to PE    Code Status:     Code Status Orders  (From admission, onward)           Start     Ordered   05/25/22 0100  Full code  Continuous       Question:  By:  Answer:  Consent: discussion documented in EHR   05/25/22 0100           Code Status History     This patient has a current code status but no historical code status.      Advance Directive Documentation    [facility] Most Recent Value  Type of Advance Directive Living will  Pre-existing out of facility DNR order (yellow form or pink MOST form) --  "MOST" Form in Place? --         IV Access:   Peripheral IV   Procedures and diagnostic studies:   VAS [place] LOWER EXTREMITY VENOUS (DVT)  Result Date: 05/25/2022  Lower Venous DVT Study Patient Name:  Veronica Stone  Date of Exam:   05/25/2022 Medical Rec #: [id number]         Accession #:    [id number] Date of Birth: February 12, 1932         Patient Gender: F Patient Age:   18 years Exam Location:  [facility] Procedure:  VAS [place] LOWER EXTREMITY VENOUS (DVT) Referring Phys: Nyoka Lint DOUTOVA --------------------------------------------------------------------------------  Indications: Pulmonary embolism.  Anticoagulation: Heparin. Comparison Study: No prior studies. Performing Technologist: Darlin Coco RDMS, RVT  Examination Guidelines: A complete evaluation includes B-mode imaging, spectral Doppler, color Doppler, and power Doppler as needed of all accessible portions of each vessel. Bilateral testing is considered an integral part of a complete examination. Limited examinations for reoccurring  indications may be performed as noted. The reflux portion of the exam is performed with the patient in reverse Trendelenburg.  +---------+---------------+---------+-----------+----------+--------------+ RIGHT    CompressibilityPhasicitySpontaneityPropertiesThrombus Aging +---------+---------------+---------+-----------+----------+--------------+ CFV      Full           Yes      Yes                                 +---------+---------------+---------+-----------+----------+--------------+ SFJ      Full                                                        +---------+---------------+---------+-----------+----------+--------------+ FV Prox  Full                                                        +---------+---------------+---------+-----------+----------+--------------+ FV Mid   Full                                                        +---------+---------------+---------+-----------+----------+--------------+ FV DistalFull                                                        +---------+---------------+---------+-----------+----------+--------------+ PFV      Full                                                        +---------+---------------+---------+-----------+----------+--------------+ POP      Full           Yes      Yes                                 +---------+---------------+---------+-----------+----------+--------------+ PTV      Full                                                        +---------+---------------+---------+-----------+----------+--------------+ PERO     Full                                                        +---------+---------------+---------+-----------+----------+--------------+  Gastroc  Partial        Yes      Yes                  Acute          +---------+---------------+---------+-----------+----------+--------------+    +---------+---------------+---------+-----------+----------+--------------+ LEFT     CompressibilityPhasicitySpontaneityPropertiesThrombus Aging +---------+---------------+---------+-----------+----------+--------------+ CFV      Full           Yes      Yes                                 +---------+---------------+---------+-----------+----------+--------------+ SFJ      Full                                                        +---------+---------------+---------+-----------+----------+--------------+ FV Prox  Full                                                        +---------+---------------+---------+-----------+----------+--------------+ FV Mid   Full                                                        +---------+---------------+---------+-----------+----------+--------------+ FV DistalFull                                                        +---------+---------------+---------+-----------+----------+--------------+ PFV      Full                                                        +---------+---------------+---------+-----------+----------+--------------+ POP      Full           Yes      Yes                                 +---------+---------------+---------+-----------+----------+--------------+ PTV      Full                                                        +---------+---------------+---------+-----------+----------+--------------+ PERO     None           No       No                   Acute          +---------+---------------+---------+-----------+----------+--------------+ Gastroc  Full                                                        +---------+---------------+---------+-----------+----------+--------------+  Summary: RIGHT: - Findings consistent with acute deep vein thrombosis involving the right gastrocnemius veins. - No cystic structure found in the popliteal fossa.  LEFT: - Findings consistent with  acute deep vein thrombosis involving the left peroneal veins. - No cystic structure found in the popliteal fossa.  *See table(s) above for measurements and observations. Electronically signed by Servando Snare MD on 05/25/2022 at 5:24:19 PM.    Final    ECHOCARDIOGRAM COMPLETE  Result Date: 05/25/2022    ECHOCARDIOGRAM REPORT   Patient Name:   Veronica Stone Date of Exam: 05/25/2022 Medical Rec #:  [id number]        Height:       63.0 in Accession #:    [id number]       Weight:       175.9 lb Date of Birth:  05-Nov-1931        BSA:          1.831 m Patient Age:    33 years         BP:           103/69 mmHg Patient Gender: F                HR:           87 bpm. Exam Location:  Inpatient Procedure: 2D Echo, Cardiac Doppler and Color Doppler Indications:    I26.02 Pulmonary embolus  History:        Patient has prior history of Echocardiogram examinations, most                 recent 12/06/2005. Signs/Symptoms:Shortness of Breath, Dyspnea,                 Chest Pain and Bacteremia; Risk Factors:Hypertension and Former                 Smoker.  Sonographer:    Roseanna Rainbow RDCS Referring Phys: Toy Baker  Sonographer Comments: Technically difficult study due to poor echo windows. IMPRESSIONS  1. Left ventricular ejection fraction, by estimation, is 65 to 70%. The left ventricle has normal function. The left ventricle has no regional wall motion abnormalities. There is mild left ventricular hypertrophy. Left ventricular diastolic parameters are consistent with Grade I diastolic dysfunction (impaired relaxation). There is the interventricular septum is flattened in systole, consistent with right ventricular pressure overload.  2. Right ventricular systolic function is moderately reduced. The right ventricular size is severely enlarged. There is moderately elevated pulmonary artery systolic pressure. The estimated right ventricular systolic pressure is 16.0 mmHg.  3. Tricuspid valve regurgitation is severe.  4. The  mitral valve is grossly normal. Trivial mitral valve regurgitation. No evidence of mitral stenosis.  5. The aortic valve is grossly normal. There is mild calcification of the aortic valve. Aortic valve regurgitation is not visualized. No aortic stenosis is present.  6. A small pericardial effusion is present. The pericardial effusion is posterior to the left ventricle.  7. The inferior vena cava is normal in size with greater than 50% respiratory variability, suggesting right atrial pressure of 3 mmHg.  8. Right atrial size was mildly dilated. FINDINGS  Left Ventricle: Left ventricular ejection fraction, by estimation, is 65 to 70%. The left ventricle has normal function. The left ventricle has no regional wall motion abnormalities. The left ventricular internal cavity size was normal in size. There is  mild left ventricular hypertrophy. The interventricular septum is flattened in systole, consistent with right ventricular pressure overload. Left  ventricular diastolic parameters are consistent with Grade I diastolic dysfunction (impaired relaxation). Right Ventricle: The right ventricular size is severely enlarged. No increase in right ventricular wall thickness. Right ventricular systolic function is moderately reduced. There is moderately elevated pulmonary artery systolic pressure. The tricuspid regurgitant velocity is 3.31 m/s, and with an assumed right atrial pressure of 15 mmHg, the estimated right ventricular systolic pressure is 28.7 mmHg. Left Atrium: Left atrial size was normal in size. Right Atrium: Right atrial size was mildly dilated. Pericardium: A small pericardial effusion is present. The pericardial effusion is posterior to the left ventricle. Presence of epicardial fat layer. Mitral Valve: The mitral valve is grossly normal. Trivial mitral valve regurgitation. No evidence of mitral valve stenosis. Tricuspid Valve: The tricuspid valve is normal in structure. Tricuspid valve regurgitation is severe.  No evidence of tricuspid stenosis. Aortic Valve: The aortic valve is grossly normal. There is mild calcification of the aortic valve. Aortic valve regurgitation is not visualized. No aortic stenosis is present. Pulmonic Valve: The pulmonic valve was normal in structure. Pulmonic valve regurgitation is mild. No evidence of pulmonic stenosis. Aorta: The aortic root is normal in size and structure. Ascending aorta measurements are within normal limits for age when indexed to body surface area. Venous: The inferior vena cava is normal in size with greater than 50% respiratory variability, suggesting right atrial pressure of 3 mmHg. IAS/Shunts: No atrial level shunt detected by color flow Doppler.  LEFT VENTRICLE PLAX 2D LVIDd:         3.30 cm   Diastology LVIDs:         1.80 cm   LV e' medial:    2.94 cm/s LV PW:         1.00 cm   LV E/e' medial:  21.7 LV IVS:        1.10 cm   LV e' lateral:   5.35 cm/s LVOT diam:     2.20 cm   LV E/e' lateral: 11.9 LV SV:         66 LV SV Index:   36 LVOT Area:     3.80 cm  RIGHT VENTRICLE             IVC RV S prime:     12.10 cm/s  IVC diam: 2.25 cm RVOT diam:      2.30 cm TAPSE (M-mode): 1.3 cm LEFT ATRIUM             Index       RIGHT ATRIUM           Index LA diam:        2.70 cm 1.47 cm/m  RA Area:     18.20 cm LA Vol (A2C):   17.7 ml 9.67 ml/m  RA Volume:   52.80 ml  28.84 ml/m LA Vol (A4C):   17.9 ml 9.78 ml/m LA Biplane Vol: 17.9 ml 9.78 ml/m  AORTIC VALVE LVOT Vmax:   111.00 cm/s LVOT Vmean:  71.900 cm/s LVOT VTI:    0.173 m  AORTA Ao Root diam: 3.30 cm Ao Asc diam:  3.80 cm MITRAL VALVE                TRICUSPID VALVE MV Area (PHT): 3.99 cm     TR Peak grad:   43.8 mmHg MV Decel Time: 190 msec     TR Vmax:        331.00 cm/s MV E velocity: 63.90 cm/s MV A velocity: 111.00 cm/s  SHUNTS MV E/A ratio:  0.58         Systemic VTI:  0.17 m                             Systemic Diam: 2.20 cm                             Pulmonic Diam: 2.30 cm Cherlynn Kaiser MD Electronically  signed by Cherlynn Kaiser MD Signature Date/Time: 05/25/2022/3:36:05 PM    Final      Medical Consultants:   None.   Subjective:    Veronica Stone relates she started having bright red blood per rectum this morning.  Objective:    Vitals:   05/26/22 2000 05/26/22 2300 05/27/22 0310 05/27/22 0747  BP: 124/76 130/83 118/78 134/81  Pulse: 93 81 77 73  Resp: 20 (!) 22 17 (!) 22  Temp: 98.3 F (36.8 C) 98.2 F (36.8 C) 98.2 F (36.8 C) 97.9 F (36.6 C)  TempSrc: Oral Oral Oral Oral  SpO2: 99% 99% 98% 99%  Weight:      Height:       SpO2: 99 % O2 Flow Rate (L/min): 5 L/min   Intake/Output Summary (Last 24 hours) at 05/27/2022 0836 Last data filed at 05/26/2022 1506 Gross per 24 hour  Intake 446.21 ml  Output --  Net 446.21 ml    Filed Weights   05/28/2022 1917  Weight: 79.8 kg    Exam: General exam: In no acute distress. Respiratory system: Good air movement and clear to auscultation. Cardiovascular system: S1 & S2 heard, RRR. No JVD. Gastrointestinal system: Abdomen is nondistended, soft and nontender.  Extremities: No pedal edema. Skin: No rashes, lesions or ulcers Psychiatry: Judgement and insight appear normal. Mood & affect appropriate.  Data Reviewed:    Labs: Basic Metabolic Panel: Recent Labs  Lab 05/19/2022 1936 05/31/2022 1951 05/25/22 0109 05/25/22 1508  NA 138 136  --  140  K 3.9 4.2  --  3.4*  CL 97*  --   --  106  CO2 23  --   --  24  GLUCOSE 165*  --   --  122*  BUN 17  --   --  16  CREATININE 1.43*  --   --  1.11*  CALCIUM 9.1  --   --  8.0*  MG  --   --  2.1 2.3  PHOS  --   --  3.6 3.5    GFR Estimated Creatinine Clearance: 35.1 mL/min (A) (by C-G formula based on SCr of 1.11 mg/dL (H)). Liver Function Tests: Recent Labs  Lab 06/02/2022 1936 05/25/22 1508  AST 60* 39  ALT 27 23  ALKPHOS 66 60  BILITOT 1.0 0.9  PROT 5.9* 5.4*  ALBUMIN 3.4* 2.9*    No results for input(s): "LIPASE", "AMYLASE" in the last 168 hours. No  results for input(s): "AMMONIA" in the last 168 hours. Coagulation profile Recent Labs  Lab 05/25/22 0009  INR 1.3*    COVID-19 Labs  No results for input(s): "DDIMER", "FERRITIN", "LDH", "CRP" in the last 72 hours.  Lab Results  Component Value Date   [place] NEGATIVE 05/28/2022    CBC: Recent Labs  Lab 06/03/2022 1936 05/21/2022 1951 05/25/22 0630 05/25/22 1508 05/26/22 0417 05/27/22 0121  WBC 18.0*  --  16.1* 18.8* 22.4* 16.2*  NEUTROABS 14.0*  --   --   --   --   --  HGB 15.6* 16.0* 13.0 14.2 13.3 13.0  HCT 48.8* 47.0* 39.8 43.1 40.6 38.8  MCV 88.7  --  88.2 87.1 87.5 86.0  PLT 278  --  196 242 248 279    Cardiac Enzymes: Recent Labs  Lab 05/25/22 0109  CKTOTAL 136    BNP (last 3 results) No results for input(s): "PROBNP" in the last 8760 hours. CBG: No results for input(s): "GLUCAP" in the last 168 hours. D-Dimer: No results for input(s): "DDIMER" in the last 72 hours. Hgb A1c: No results for input(s): "HGBA1C" in the last 72 hours. Lipid Profile: No results for input(s): "CHOL", "HDL", "LDLCALC", "TRIG", "CHOLHDL", "LDLDIRECT" in the last 72 hours. Thyroid function studies: Recent Labs    05/25/22 0109  TSH 0.513    Anemia work up: No results for input(s): "VITAMINB12", "FOLATE", "FERRITIN", "TIBC", "IRON", "RETICCTPCT" in the last 72 hours. Sepsis Labs: Recent Labs  Lab 06/13/2022 1915 06/14/2022 1936 05/23/2022 2115 05/25/22 0009 05/25/22 0109 05/25/22 0220 05/25/22 0630 05/25/22 1508 05/26/22 0417 05/27/22 0121  PROCALCITON  --   --   --   --  3.58  --   --   --  3.29  --   WBC  --    < >  --   --   --   --  16.1* 18.8* 22.4* 16.2*  LATICACIDVEN 5.2*  --  2.6* 2.1*  --  1.9  --   --   --   --    < > = values in this interval not displayed.    Microbiology Recent Results (from the past 240 hour(s))  Resp panel by RT-PCR (RSV, Flu A&B, Covid) Anterior Nasal Swab     Status: None   Collection Time: 06/14/2022  7:15 PM   Specimen:  Anterior Nasal Swab  Result Value Ref Range Status   SARS Coronavirus 2 by RT PCR NEGATIVE NEGATIVE Final    Comment: (NOTE) SARS-CoV-2 target nucleic acids are NOT DETECTED.  The SARS-CoV-2 RNA is generally detectable in upper respiratory specimens during the acute phase of infection. The lowest concentration of SARS-CoV-2 viral copies this assay can detect is 138 copies/mL. A negative result does not preclude SARS-Cov-2 infection and should not be used as the sole basis for treatment or other patient management decisions. A negative result may occur with  improper specimen collection/handling, submission of specimen other than nasopharyngeal swab, presence of viral mutation(s) within the areas targeted by this assay, and inadequate number of viral copies(<138 copies/mL). A negative result must be combined with clinical observations, patient history, and epidemiological information. The expected result is Negative.  Fact Sheet for Patients:  EntrepreneurPulse.com.au  Fact Sheet for Healthcare Providers:  IncredibleEmployment.be  This test is no t yet approved or cleared by the [place] FDA and  has been authorized for detection and/or diagnosis of SARS-CoV-2 by FDA under an Emergency Use Authorization (EUA). This EUA will remain  in effect (meaning this test can be used) for the duration of the COVID-19 declaration under Section 564(b)(1) of the Act, 21 U.S.C.section 360bbb-3(b)(1), unless the authorization is terminated  or revoked sooner.       Influenza A by PCR NEGATIVE NEGATIVE Final   Influenza B by PCR NEGATIVE NEGATIVE Final    Comment: (NOTE) The Xpert Xpress SARS-CoV-2/FLU/RSV plus assay is intended as an aid in the diagnosis of influenza from Nasopharyngeal swab specimens and should not be used as a sole basis for treatment. Nasal washings and aspirates are unacceptable for Xpert  Xpress SARS-CoV-2/FLU/RSV testing.  Fact  Sheet for Patients: EntrepreneurPulse.com.au  Fact Sheet for Healthcare Providers: IncredibleEmployment.be  This test is not yet approved or cleared by the [place] FDA and has been authorized for detection and/or diagnosis of SARS-CoV-2 by FDA under an Emergency Use Authorization (EUA). This EUA will remain in effect (meaning this test can be used) for the duration of the COVID-19 declaration under Section 564(b)(1) of the Act, 21 U.S.C. section 360bbb-3(b)(1), unless the authorization is terminated or revoked.     Resp Syncytial Virus by PCR NEGATIVE NEGATIVE Final    Comment: (NOTE) Fact Sheet for Patients: EntrepreneurPulse.com.au  Fact Sheet for Healthcare Providers: IncredibleEmployment.be  This test is not yet approved or cleared by the [place] FDA and has been authorized for detection and/or diagnosis of SARS-CoV-2 by FDA under an Emergency Use Authorization (EUA). This EUA will remain in effect (meaning this test can be used) for the duration of the COVID-19 declaration under Section 564(b)(1) of the Act, 21 U.S.C. section 360bbb-3(b)(1), unless the authorization is terminated or revoked.  Performed at [facility], [place] [address], [place], [place] [address]   Blood culture (routine x 2)     Status: None (Preliminary result)   Collection Time: 05/20/2022  7:36 PM   Specimen: BLOOD  Result Value Ref Range Status   Specimen Description BLOOD RIGHT ANTECUBITAL  Final   Special Requests   Final    BOTTLES DRAWN AEROBIC AND ANAEROBIC Blood Culture adequate volume   Culture   Final    NO GROWTH 2 DAYS Performed at [facility], [place] [address] [place], [place] [address]    Report Status PENDING  Incomplete  Blood culture (routine x 2)     Status: None (Preliminary result)   Collection Time: 05/21/2022  7:53 PM   Specimen: BLOOD  Result Value Ref Range Status   Specimen  Description BLOOD LEFT ANTECUBITAL  Final   Special Requests   Final    BOTTLES DRAWN AEROBIC AND ANAEROBIC Blood Culture results may not be optimal due to an inadequate volume of blood received in culture bottles   Culture   Final    NO GROWTH 2 DAYS Performed at [facility], [place] [address], [place], [place] [address]    Report Status PENDING  Incomplete  Respiratory (~20 pathogens) panel by PCR     Status: None   Collection Time: 05/25/22  1:12 AM   Specimen: Nasopharyngeal Swab; Respiratory  Result Value Ref Range Status   Adenovirus NOT DETECTED NOT DETECTED Final   Coronavirus 229E NOT DETECTED NOT DETECTED Final    Comment: (NOTE) The Coronavirus on the Respiratory Panel, DOES NOT test for the novel  Coronavirus (2019 nCoV)    Coronavirus HKU1 NOT DETECTED NOT DETECTED Final   Coronavirus NL63 NOT DETECTED NOT DETECTED Final   Coronavirus OC43 NOT DETECTED NOT DETECTED Final   Metapneumovirus NOT DETECTED NOT DETECTED Final   Rhinovirus / Enterovirus NOT DETECTED NOT DETECTED Final   Influenza A NOT DETECTED NOT DETECTED Final   Influenza B NOT DETECTED NOT DETECTED Final   Parainfluenza Virus 1 NOT DETECTED NOT DETECTED Final   Parainfluenza Virus 2 NOT DETECTED NOT DETECTED Final   Parainfluenza Virus 3 NOT DETECTED NOT DETECTED Final   Parainfluenza Virus 4 NOT DETECTED NOT DETECTED Final   Respiratory Syncytial Virus NOT DETECTED NOT DETECTED Final   Bordetella pertussis NOT DETECTED NOT DETECTED Final   Bordetella Parapertussis NOT DETECTED  NOT DETECTED Final   Chlamydophila pneumoniae NOT DETECTED NOT DETECTED Final   Mycoplasma pneumoniae NOT DETECTED NOT DETECTED Final    Comment: Performed at [facility], [place] [address] [place], [place] [address]     Medications:    hydrocortisone sod succinate (SOLU-CORTEF) inj  50 mg Intravenous Q12H   pantoprazole  40 mg Oral BID   predniSONE  10 mg Oral Q breakfast   Continuous Infusions:  azithromycin  500 mg (05/26/22 2306)   cefTRIAXone (ROCEPHIN)  IV 2 g (05/26/22 2115)      LOS: 3 days   Charlynne Cousins  [facility]  05/27/2022, 8:36 AM

## 2022-05-27 NOTE — Progress Notes (Signed)
ANTICOAGULATION CONSULT NOTE Pharmacy Consult for heparin  Indication: PE Brief A/P: Heparin level subtherapeutic Increase Heparin rate  Allergies  Allergen Reactions   Dilaudid [Hydromorphone Hcl] Nausea And Vomiting   Penicillins Itching and Rash   Statins Itching    Patient Measurements: Height: '5\' 3"'$  (160 cm) Weight: 79.8 kg (175 lb 14.8 oz) IBW/kg (Calculated) : 52.4 Heparin Dosing Weight: 70kg  Vital Signs: Temp: 98.2 F (36.8 C) (01/11 2300) Temp Source: Oral (01/11 2300) BP: 130/83 (01/11 2300) Pulse Rate: 81 (01/11 2300)  Labs: Recent Labs    05/25/2022 1936 06/05/2022 1951 05/25/22 0009 05/25/22 0109 05/25/22 0240 05/25/22 0630 05/25/22 1036 05/25/22 1508 05/25/22 1745 05/26/22 0417 05/26/22 1218 05/27/22 0121  HGB 15.6*   < >  --   --   --  13.0  --  14.2  --  13.3  --  13.0  HCT 48.8*   < >  --   --   --  39.8  --  43.1  --  40.6  --  38.8  PLT 278  --   --   --   --  196  --  242  --  248  --  279  APTT  --   --  29  --   --   --   --   --   --   --   --   --   LABPROT  --   --  15.7*  --   --   --   --   --   --   --   --   --   INR  --   --  1.3*  --   --   --   --   --   --   --   --   --   HEPARINUNFRC  --   --   --   --   --   --    < >  --    < > 0.38 0.34 0.24*  CREATININE 1.43*  --   --   --   --   --   --  1.11*  --   --   --   --   CKTOTAL  --   --   --  136  --   --   --   --   --   --   --   --   TROPONINIHS  --   --   --  9,485* 1,633* 1,503*  --   --   --   --   --   --    < > = values in this interval not displayed.     Estimated Creatinine Clearance: 35.1 mL/min (A) (by C-G formula based on SCr of 1.11 mg/dL (H)).  Assessment: 89 y.o. female with PE/DVT for heparin  Goal of Therapy:  Heparin level 0.3-0.7 units/ml Monitor platelets by anticoagulation protocol: Yes   Plan:  Increase Heparin 1100 units/hr  Phillis Knack, PharmD, BCPS

## 2022-05-27 NOTE — Progress Notes (Signed)
Patient IV leaking blood and significant amount of blood in BM. Heparin stopped and pharmacy notified. Pharmacy called MD and informed RN to stop heparin at this time. Will continue to monitor.  Veronica Stone

## 2022-05-27 NOTE — Evaluation (Signed)
Occupational Therapy Evaluation Patient Details Name: Veronica Stone MRN: [id number] DOB: [birth year] Today's Date: 05/27/2022   History of Present Illness Pt is an 89 y.o. female who presented 05/21/2022 with increasing SOB, chills, & N/V x2 weeks. Pt admitted with bil upper lobe PE and bil lower extremity DVTs and possible sepsis. Complicated by acute GI bleed 1/12. PMH: pulmonary fibrosis hypertension, rheumatoid arthritis, COPD on chronic oxygen up to 4 L, history of AAA, gastric ulcer, history of DVT, HLD, HTN   Clinical Impression   PTA patient reports modified independent for ADLs using rollator, needing assist for IADLs and having poor activity tolerance. Pt on 4L O2 at baseline, on 6L today with SpO2 maintained but limited session. She completes long sitting in bed with supervision, simulated ADLs with setup to min assist. Discussed energy conservation, compensatory techniques and recommendation for 24/7 support initially at dc.  Pts daughter present and agreeable to assist as needed. Recommend further oT acutely and after dc at [facility] level to increase tolerance and return to PLOF with ADLs.      Recommendations for follow up therapy are one component of a multi-disciplinary discharge planning process, led by the attending physician.  Recommendations may be updated based on patient status, additional functional criteria and insurance authorization.   Follow Up Recommendations  Home health OT     Assistance Recommended at Discharge Frequent or constant Supervision/Assistance  Patient can return home with the following A little help with walking and/or transfers;A little help with bathing/dressing/bathroom;Assistance with cooking/housework;Help with stairs or ramp for entrance;Assist for transportation    Functional Status Assessment  Patient has had a recent decline in their functional status and demonstrates the ability to make significant improvements in function in a reasonable and  predictable amount of time.  Equipment Recommendations  None recommended by OT    Recommendations for Other Services       Precautions / Restrictions Precautions Precautions: Fall;Other (comment) Precaution Comments: watch SpO2 Restrictions Weight Bearing Restrictions: No      Mobility Bed Mobility Overal bed mobility: Needs Assistance             General bed mobility comments: supervision pulling into long sitting    Transfers                   General transfer comment: declined      Balance                                           ADL either performed or assessed with clinical judgement   ADL Overall ADL's : Needs assistance/impaired     Grooming: Set up;Sitting           Upper Body Dressing : Set up;Sitting   Lower Body Dressing: Minimal assistance;Sitting/lateral leans     Toilet Transfer Details (indicate cue type and reason): declined           General ADL Comments: limited eval, pt long sitting in bed for simulated ADLs but declined OOB this date     Vision   Vision Assessment?: No apparent visual deficits     Perception     Praxis      Pertinent Vitals/Pain Pain Assessment Pain Assessment: Faces Faces Pain Scale: No hurt Pain Intervention(s): Monitored during session     Hand Dominance     Extremity/Trunk Assessment Upper Extremity Assessment Upper Extremity Assessment:  Generalized weakness   Lower Extremity Assessment Lower Extremity Assessment: Defer to PT evaluation       Communication Communication Communication: No difficulties   Cognition Arousal/Alertness: Awake/alert Behavior During Therapy: WFL for tasks assessed/performed Overall Cognitive Status: Within Functional Limits for tasks assessed                                       General Comments       Exercises     Shoulder Instructions      Home Living Family/patient expects to be discharged to:: Private  residence Living Arrangements: Alone Available Help at Discharge: Family;Available PRN/intermittently (daughter can stay 24/7 if needed) Type of Home: House Home Access: Stairs to enter [employer] of Steps: 2-4 Entrance Stairs-Rails: Right;Can reach both;Left Home Layout: One level     Bathroom Shower/Tub: Tub/shower unit         Home Equipment: Grab bars - tub/shower;Shower seat;Cane - single point;Rollator (4 wheels);Electric scooter;BSC/3in1          Prior Functioning/Environment Prior Level of Function : Needs assist       Physical Assist : ADLs (physical)   ADLs (physical): IADLs Mobility Comments: Pt mod I using rollator vs cane intermittently, sometimes no UE support for household mobility. x1 fall in past 6 months. ADLs Comments: Relies on daughter for transportation. Daughter provides pt with groceries and meals and will perform heavier household chores as needed, otherwise pt is mod I.        OT Problem List: Decreased strength;Decreased activity tolerance;Impaired balance (sitting and/or standing);Obesity;Cardiopulmonary status limiting activity;Decreased knowledge of precautions;Decreased knowledge of use of DME or AE      OT Treatment/Interventions: Self-care/ADL training;Therapeutic exercise;DME and/or AE instruction;Energy conservation;Therapeutic activities;Patient/family education;Balance training    OT Goals(Current goals can be found in the care plan section) Acute Rehab OT Goals Patient Stated Goal: home OT Goal Formulation: With patient Time For Goal Achievement: 06/10/22 Potential to Achieve Goals: Good  OT Frequency: Min 2X/week    Co-evaluation              AM-PAC OT "6 Clicks" Daily Activity     Outcome Measure Help from another person eating meals?: None Help from another person taking care of personal grooming?: A Little Help from another person toileting, which includes using toliet, bedpan, or urinal?: A Little Help  from another person bathing (including washing, rinsing, drying)?: A Little Help from another person to put on and taking off regular upper body clothing?: A Little Help from another person to put on and taking off regular lower body clothing?: A Little 6 Click Score: 19   End of Session Equipment Utilized During Treatment: Oxygen Nurse Communication: Mobility status  Activity Tolerance: Patient limited by fatigue Patient left: in bed;with call bell/phone within reach;with bed alarm set;with family/visitor present  OT Visit Diagnosis: Other abnormalities of gait and mobility (R26.89);Muscle weakness (generalized) (M62.81)                Time: 6283-6629 OT Time Calculation (min): 16 min Charges:  OT General Charges $OT Visit: 1 Visit OT Evaluation $OT Eval Moderate Complexity: 1 Mod  Jolaine Artist, OT Acute Rehabilitation Services Office [phone]   Delight Stare 05/27/2022, 12:57 PM

## 2022-05-27 NOTE — Care Management Important Message (Signed)
Important Message  Patient Details  Name: Veronica Stone MRN: [id number] Date of Birth: 1932/03/15   Medicare Important Message Given:  Yes     Shelda Altes 05/27/2022, 8:38 AM

## 2022-05-28 DIAGNOSIS — K625 Hemorrhage of anus and rectum: Secondary | ICD-10-CM | POA: Diagnosis not present

## 2022-05-28 DIAGNOSIS — R7989 Other specified abnormal findings of blood chemistry: Secondary | ICD-10-CM | POA: Diagnosis not present

## 2022-05-28 DIAGNOSIS — J189 Pneumonia, unspecified organism: Secondary | ICD-10-CM | POA: Diagnosis not present

## 2022-05-28 DIAGNOSIS — K922 Gastrointestinal hemorrhage, unspecified: Secondary | ICD-10-CM | POA: Diagnosis not present

## 2022-05-28 DIAGNOSIS — J8410 Pulmonary fibrosis, unspecified: Secondary | ICD-10-CM | POA: Diagnosis not present

## 2022-05-28 DIAGNOSIS — D509 Iron deficiency anemia, unspecified: Secondary | ICD-10-CM | POA: Diagnosis not present

## 2022-05-28 DIAGNOSIS — I2699 Other pulmonary embolism without acute cor pulmonale: Secondary | ICD-10-CM | POA: Diagnosis not present

## 2022-05-28 LAB — CBC
HCT: 37.7 % (ref 36.0–46.0)
Hemoglobin: 12.6 g/dL (ref 12.0–15.0)
MCH: 29.3 pg (ref 26.0–34.0)
MCHC: 33.4 g/dL (ref 30.0–36.0)
MCV: 87.7 fL (ref 80.0–100.0)
Platelets: 268 10*3/uL (ref 150–400)
RBC: 4.3 MIL/uL (ref 3.87–5.11)
RDW: 13.7 % (ref 11.5–15.5)
WBC: 12.8 10*3/uL — ABNORMAL HIGH (ref 4.0–10.5)
nRBC: 0.4 % — ABNORMAL HIGH (ref 0.0–0.2)

## 2022-05-28 MED ORDER — PEG 3350-KCL-NA BICARB-NACL 420 G PO SOLR
4000.0000 mL | Freq: Once | ORAL | Status: AC
  Administered 2022-05-28: 4000 mL via ORAL
  Filled 2022-05-28: qty 4000

## 2022-05-28 MED ORDER — SODIUM CHLORIDE 0.9 % IV SOLN: INTRAVENOUS | Status: DC

## 2022-05-28 MED ORDER — POTASSIUM CHLORIDE 20 MEQ PO PACK
40.0000 meq | PACK | Freq: Two times a day (BID) | ORAL | Status: AC
  Administered 2022-05-28 (×2): 40 meq via ORAL
  Filled 2022-05-28 (×2): qty 2

## 2022-05-28 MED ORDER — HYDROCORTISONE SOD SUC (PF) 100 MG IJ SOLR
25.0000 mg | Freq: Two times a day (BID) | INTRAMUSCULAR | Status: AC
  Administered 2022-05-28 – 2022-05-29 (×2): 25 mg via INTRAVENOUS
  Filled 2022-05-28 (×2): qty 0.5

## 2022-05-28 MED ORDER — IRBESARTAN 300 MG PO TABS: 300.0000 mg | ORAL_TABLET | Freq: Every day | ORAL | Status: DC

## 2022-05-28 MED ORDER — HYDROCORTISONE SOD SUC (PF) 100 MG IJ SOLR
25.0000 mg | Freq: Two times a day (BID) | INTRAMUSCULAR | Status: DC
  Administered 2022-05-28: 25 mg via INTRAVENOUS

## 2022-05-28 NOTE — Progress Notes (Signed)
Subjective: No new complaints.  Feeling well.  Objective: Vital signs in last 24 hours: Temp:  [97.8 F (36.6 C)-98.1 F (36.7 C)] 98 F (36.7 C) (01/13 0726) Pulse Rate:  [78-100] 80 (01/13 0726) Resp:  [20-29] 20 (01/13 0844) BP: (112-129)/(68-83) 119/73 (01/13 0726) SpO2:  [90 %-98 %] 97 % (01/13 0726) Last BM Date : 05/28/22  Intake/Output from previous day: 01/12 0701 - 01/13 0700 In: 840 [P.O.:840] Out: -  Intake/Output this shift: No intake/output data recorded.  General appearance: alert and no distress Resp: clear to auscultation bilaterally Cardio: regular rate and rhythm GI: soft, non-tender; bowel sounds normal; no masses,  no organomegaly  Lab Results: Recent Labs    05/27/22 0121 05/27/22 1033 05/28/22 0046  WBC 16.2* 14.3* 12.8*  HGB 13.0 12.9 12.6  HCT 38.8 39.7 37.7  PLT 279 282 268   BMET Recent Labs    05/25/22 1508 05/27/22 1033  NA 140 142  K 3.4* 3.3*  CL 106 108  CO2 24 24  GLUCOSE 122* 104*  BUN 16 16  CREATININE 1.11* 1.04*  CALCIUM 8.0* 8.2*   LFT Recent Labs    05/25/22 1508  PROT 5.4*  ALBUMIN 2.9*  AST 39  ALT 23  ALKPHOS 60  BILITOT 0.9   PT/INR No results for input(s): "LABPROT", "INR" in the last 72 hours. Hepatitis Panel No results for input(s): "HEPBSAG", "HCVAB", "HEPAIGM", "HEPBIGM" in the last 72 hours. C-Diff No results for input(s): "CDIFFTOX" in the last 72 hours. Fecal Lactopherrin No results for input(s): "FECLLACTOFRN" in the last 72 hours.  Studies/Results: No results found.  Medications: Scheduled:  hydrocortisone sod succinate (SOLU-CORTEF) inj  25 mg Intravenous Q12H   pantoprazole  40 mg Oral BID   polyethylene glycol-electrolytes  4,000 mL Oral Once   predniSONE  10 mg Oral Q breakfast   Continuous:  azithromycin 500 mg (05/28/22 0051)   cefTRIAXone (ROCEPHIN)  IV 2 g (05/27/22 2314)    Assessment/Plan: 1) Hematochezia. 2) Bilateral PE. 3) Hypokalemia. 4) ILD.   The patient is  saturating at 96% on 5 liters of [place].  She appears comfortable.  She is high risk for the procedures, but further evaluation is required with the bilateral PEs and hematochezia with attempted anticoagulation.  The patient is agreeable to further work up.  Plan: 1) Correct hypokalemia. 2) EGD/colonoscopy tomorrow.  LOS: 4 days   Reilly Blades D 05/28/2022, 8:57 AM

## 2022-05-28 NOTE — Plan of Care (Signed)

## 2022-05-28 NOTE — Progress Notes (Signed)
[facility] PROGRESS NOTE    Progress Note  Veronica Stone  KXF:[id number] DOB: 11-22-1931 DOA: 05/25/2022 PCP: Deland Pretty, MD     Brief Narrative:   Veronica Stone is an 89 y.o. female past medical history significant for pulmonary fibrosis on 4 L of oxygen at home on steroids, there is probably due to rheumatoid arthritis for which she is on Remicade, history of AAA, gastric ulcer, history of DVT comes in with shortness of breath that started about 2 weeks prior to admission cyanotic on arrival satting 80% in the ED placed on 12 L of oxygen, CT angio of the chest showed bilateral PE, new patchy groundglass opacity bilaterally.  Assessment/Plan:   Acute on chronic respiratory failure with hypoxia due to PE: Lower extremity Doppler showed bilateral DVT. PT eval recommended home health PT Heparin stop due to GI bleed, GI consulted for EGD and colonoscopy tomorrow morning. Pulmonary is following. She is high risk for procedure she agreed to proceed with further workup.  New acute lower GI bleed: She related bright red blood per rectum in her stool this morning. Heparin stopped hemoglobin dropped from 14.3-12.6, GI was consulted for EGD and colonoscopy tomorrow morning.  History of rheumatoid arthritis: Continue Remicade and continue taper down steroids.  Hypotension: Resolved Culture sent on 05/18/2022 has been negative till date. Completed 5-day course of Rocephin.  Acute kidney injury: Likely prerenal azotemia with a baseline creatinine of less than 1 on admission 1.4. It is improving basic metabolic panels pending this morning.  Chronic diastolic heart failure: Continue to hold diuretic therapy, he appears euvolemic.  Elevated troponins: She denies any chest pain likely demand ischemia. 2D echo is no WMA. Twelve-lead EKG showed no signs of ischemia.  Essential hypertension: Continue hold diuretic therapy and ARB. KVO IV fluids.  Hypokalemia: Low again  today replete orally recheck tomorrow morning.    DVT prophylaxis: Holding anticoagulation due to GI bleed Family Communication:none Status is: Inpatient Remains inpatient appropriate because: Acute on chronic respiratory failure with hypoxia due to PE    Code Status:     Code Status Orders  (From admission, onward)           Start     Ordered   05/25/22 0100  Full code  Continuous       Question:  By:  Answer:  Consent: discussion documented in EHR   05/25/22 0100           Code Status History     This patient has a current code status but no historical code status.      Advance Directive Documentation    [place] Most Recent Value  Type of Advance Directive Living will  Pre-existing out of facility DNR order (yellow form or pink MOST form) --  "MOST" Form in Place? --         IV Access:   Peripheral IV   Procedures and diagnostic studies:   No results found.   Medical Consultants:   None.   Subjective:    Veronica Stone had another episode of rectal bleeding yesterday night.  Objective:    Vitals:   05/28/22 0412 05/28/22 0415 05/28/22 0726 05/28/22 0844  BP: 112/77  119/73   Pulse: 79 78 80   Resp: (!) '29 20 20 20  '$ Temp: 98 F (36.7 C)  98 F (36.7 C)   TempSrc: Oral  Oral   SpO2: 97% 96% 97%   Weight:      Height:  SpO2: 97 % O2 Flow Rate (L/min): 6 L/min   Intake/Output Summary (Last 24 hours) at 05/28/2022 1006 Last data filed at 05/27/2022 2020 Gross per 24 hour  Intake 480 ml  Output --  Net 480 ml    Filed Weights   06/12/2022 1917  Weight: 79.8 kg    Exam: General exam: In no acute distress. Respiratory system: Good air movement and clear to auscultation. Cardiovascular system: S1 & S2 heard, RRR. No JVD. Gastrointestinal system: Abdomen is nondistended, soft and nontender.  Extremities: No pedal edema. Skin: No rashes, lesions or ulcers Psychiatry: Judgement and insight appear normal. Mood  & affect appropriate.  Data Reviewed:    Labs: Basic Metabolic Panel: Recent Labs  Lab 05/23/2022 1936 05/17/2022 1951 05/25/22 0109 05/25/22 1508 05/27/22 1033  NA 138 136  --  140 142  K 3.9 4.2  --  3.4* 3.3*  CL 97*  --   --  106 108  CO2 23  --   --  24 24  GLUCOSE 165*  --   --  122* 104*  BUN 17  --   --  16 16  CREATININE 1.43*  --   --  1.11* 1.04*  CALCIUM 9.1  --   --  8.0* 8.2*  MG  --   --  2.1 2.3  --   PHOS  --   --  3.6 3.5  --     GFR Estimated Creatinine Clearance: 37.4 mL/min (A) (by C-G formula based on SCr of 1.04 mg/dL (H)). Liver Function Tests: Recent Labs  Lab 05/17/2022 1936 05/25/22 1508  AST 60* 39  ALT 27 23  ALKPHOS 66 60  BILITOT 1.0 0.9  PROT 5.9* 5.4*  ALBUMIN 3.4* 2.9*    No results for input(s): "LIPASE", "AMYLASE" in the last 168 hours. No results for input(s): "AMMONIA" in the last 168 hours. Coagulation profile Recent Labs  Lab 05/25/22 0009  INR 1.3*    COVID-19 Labs  No results for input(s): "DDIMER", "FERRITIN", "LDH", "CRP" in the last 72 hours.  Lab Results  Component Value Date   [place] NEGATIVE 06/13/2022    CBC: Recent Labs  Lab 06/10/2022 1936 06/01/2022 1951 05/25/22 1508 05/26/22 0417 05/27/22 0121 05/27/22 1033 05/28/22 0046  WBC 18.0*   < > 18.8* 22.4* 16.2* 14.3* 12.8*  NEUTROABS 14.0*  --   --   --   --   --   --   HGB 15.6*   < > 14.2 13.3 13.0 12.9 12.6  HCT 48.8*   < > 43.1 40.6 38.8 39.7 37.7  MCV 88.7   < > 87.1 87.5 86.0 88.4 87.7  PLT 278   < > 242 248 279 282 268   < > = values in this interval not displayed.    Cardiac Enzymes: Recent Labs  Lab 05/25/22 0109  CKTOTAL 136    BNP (last 3 results) No results for input(s): "PROBNP" in the last 8760 hours. CBG: No results for input(s): "GLUCAP" in the last 168 hours. D-Dimer: No results for input(s): "DDIMER" in the last 72 hours. Hgb A1c: No results for input(s): "HGBA1C" in the last 72 hours. Lipid Profile: No results  for input(s): "CHOL", "HDL", "LDLCALC", "TRIG", "CHOLHDL", "LDLDIRECT" in the last 72 hours. Thyroid function studies: No results for input(s): "TSH", "T4TOTAL", "T3FREE", "THYROIDAB" in the last 72 hours.  Invalid input(s): "FREET3"  Anemia work up: No results for input(s): "VITAMINB12", "FOLATE", "FERRITIN", "TIBC", "IRON", "RETICCTPCT" in the last  72 hours. Sepsis Labs: Recent Labs  Lab 05/25/2022 1915 05/23/2022 1936 05/20/2022 2115 05/25/22 0009 05/25/22 0109 05/25/22 0220 05/25/22 0630 05/26/22 0417 05/27/22 0121 05/27/22 1033 05/28/22 0046  PROCALCITON  --   --   --   --  3.58  --   --  3.29  --   --   --   WBC  --    < >  --   --   --   --    < > 22.4* 16.2* 14.3* 12.8*  LATICACIDVEN 5.2*  --  2.6* 2.1*  --  1.9  --   --   --   --   --    < > = values in this interval not displayed.    Microbiology Recent Results (from the past 240 hour(s))  Resp panel by RT-PCR (RSV, Flu A&B, Covid) Anterior Nasal Swab     Status: None   Collection Time: 05/23/2022  7:15 PM   Specimen: Anterior Nasal Swab  Result Value Ref Range Status   SARS Coronavirus 2 by RT PCR NEGATIVE NEGATIVE Final    Comment: (NOTE) SARS-CoV-2 target nucleic acids are NOT DETECTED.  The SARS-CoV-2 RNA is generally detectable in upper respiratory specimens during the acute phase of infection. The lowest concentration of SARS-CoV-2 viral copies this assay can detect is 138 copies/mL. A negative result does not preclude SARS-Cov-2 infection and should not be used as the sole basis for treatment or other patient management decisions. A negative result may occur with  improper specimen collection/handling, submission of specimen other than nasopharyngeal swab, presence of viral mutation(s) within the areas targeted by this assay, and inadequate number of viral copies(<138 copies/mL). A negative result must be combined with clinical observations, patient history, and epidemiological information. The expected  result is Negative.  Fact Sheet for Patients:  EntrepreneurPulse.com.au  Fact Sheet for Healthcare Providers:  IncredibleEmployment.be  This test is no t yet approved or cleared by the [place] FDA and  has been authorized for detection and/or diagnosis of SARS-CoV-2 by FDA under an Emergency Use Authorization (EUA). This EUA will remain  in effect (meaning this test can be used) for the duration of the COVID-19 declaration under Section 564(b)(1) of the Act, 21 U.S.C.section 360bbb-3(b)(1), unless the authorization is terminated  or revoked sooner.       Influenza A by PCR NEGATIVE NEGATIVE Final   Influenza B by PCR NEGATIVE NEGATIVE Final    Comment: (NOTE) The Xpert Xpress SARS-CoV-2/FLU/RSV plus assay is intended as an aid in the diagnosis of influenza from Nasopharyngeal swab specimens and should not be used as a sole basis for treatment. Nasal washings and aspirates are unacceptable for Xpert Xpress SARS-CoV-2/FLU/RSV testing.  Fact Sheet for Patients: EntrepreneurPulse.com.au  Fact Sheet for Healthcare Providers: IncredibleEmployment.be  This test is not yet approved or cleared by the [place] FDA and has been authorized for detection and/or diagnosis of SARS-CoV-2 by FDA under an Emergency Use Authorization (EUA). This EUA will remain in effect (meaning this test can be used) for the duration of the COVID-19 declaration under Section 564(b)(1) of the Act, 21 U.S.C. section 360bbb-3(b)(1), unless the authorization is terminated or revoked.     Resp Syncytial Virus by PCR NEGATIVE NEGATIVE Final    Comment: (NOTE) Fact Sheet for Patients: EntrepreneurPulse.com.au  Fact Sheet for Healthcare Providers: IncredibleEmployment.be  This test is not yet approved or cleared by the [place] FDA and has been authorized for detection and/or diagnosis of  SARS-CoV-2 by FDA under an Emergency Use Authorization (EUA). This EUA will remain in effect (meaning this test can be used) for the duration of the COVID-19 declaration under Section 564(b)(1) of the Act, 21 U.S.C. section 360bbb-3(b)(1), unless the authorization is terminated or revoked.  Performed at [facility], [place] [address] [place], [place] [address]   Blood culture (routine x 2)     Status: None (Preliminary result)   Collection Time: 06/15/2022  7:36 PM   Specimen: BLOOD  Result Value Ref Range Status   Specimen Description BLOOD RIGHT ANTECUBITAL  Final   Special Requests   Final    BOTTLES DRAWN AEROBIC AND ANAEROBIC Blood Culture adequate volume   Culture   Final    NO GROWTH 4 DAYS Performed at [facility], [place] [address], [place], [place] [address]    Report Status PENDING  Incomplete  Blood culture (routine x 2)     Status: None (Preliminary result)   Collection Time: 06/13/2022  7:53 PM   Specimen: BLOOD  Result Value Ref Range Status   Specimen Description BLOOD LEFT ANTECUBITAL  Final   Special Requests   Final    BOTTLES DRAWN AEROBIC AND ANAEROBIC Blood Culture results may not be optimal due to an inadequate volume of blood received in culture bottles   Culture   Final    NO GROWTH 4 DAYS Performed at [facility], [place] [address] [place], [place] [address]    Report Status PENDING  Incomplete  Respiratory (~20 pathogens) panel by PCR     Status: None   Collection Time: 05/25/22  1:12 AM   Specimen: Nasopharyngeal Swab; Respiratory  Result Value Ref Range Status   Adenovirus NOT DETECTED NOT DETECTED Final   Coronavirus 229E NOT DETECTED NOT DETECTED Final    Comment: (NOTE) The Coronavirus on the Respiratory Panel, DOES NOT test for the novel  Coronavirus (2019 nCoV)    Coronavirus HKU1 NOT DETECTED NOT DETECTED Final   Coronavirus NL63 NOT DETECTED NOT DETECTED Final   Coronavirus OC43 NOT DETECTED NOT DETECTED Final    Metapneumovirus NOT DETECTED NOT DETECTED Final   Rhinovirus / Enterovirus NOT DETECTED NOT DETECTED Final   Influenza A NOT DETECTED NOT DETECTED Final   Influenza B NOT DETECTED NOT DETECTED Final   Parainfluenza Virus 1 NOT DETECTED NOT DETECTED Final   Parainfluenza Virus 2 NOT DETECTED NOT DETECTED Final   Parainfluenza Virus 3 NOT DETECTED NOT DETECTED Final   Parainfluenza Virus 4 NOT DETECTED NOT DETECTED Final   Respiratory Syncytial Virus NOT DETECTED NOT DETECTED Final   Bordetella pertussis NOT DETECTED NOT DETECTED Final   Bordetella Parapertussis NOT DETECTED NOT DETECTED Final   Chlamydophila pneumoniae NOT DETECTED NOT DETECTED Final   Mycoplasma pneumoniae NOT DETECTED NOT DETECTED Final    Comment: Performed at [facility], [place]. [address], [place], [place] [address]     Medications:    hydrocortisone sod succinate (SOLU-CORTEF) inj  25 mg Intravenous Q12H   pantoprazole  40 mg Oral BID   polyethylene glycol-electrolytes  4,000 mL Oral Once   predniSONE  10 mg Oral Q breakfast   Continuous Infusions:  azithromycin 500 mg (05/28/22 0051)   cefTRIAXone (ROCEPHIN)  IV 2 g (05/27/22 2314)      LOS: 4 days   Charlynne Cousins  [facility]  05/28/2022, 10:06 AM

## 2022-05-28 NOTE — H&P (View-Only) (Signed)
Subjective: No new complaints.  Feeling well.  Objective: Vital signs in last 24 hours: Temp:  [97.8 F (36.6 C)-98.1 F (36.7 C)] 98 F (36.7 C) (01/13 0726) Pulse Rate:  [78-100] 80 (01/13 0726) Resp:  [20-29] 20 (01/13 0844) BP: (112-129)/(68-83) 119/73 (01/13 0726) SpO2:  [90 %-98 %] 97 % (01/13 0726) Last BM Date : 05/28/22  Intake/Output from previous day: 01/12 0701 - 01/13 0700 In: 840 [P.O.:840] Out: -  Intake/Output this shift: No intake/output data recorded.  General appearance: alert and no distress Resp: clear to auscultation bilaterally Cardio: regular rate and rhythm GI: soft, non-tender; bowel sounds normal; no masses,  no organomegaly  Lab Results: Recent Labs    05/27/22 0121 05/27/22 1033 05/28/22 0046  WBC 16.2* 14.3* 12.8*  HGB 13.0 12.9 12.6  HCT 38.8 39.7 37.7  PLT 279 282 268   BMET Recent Labs    05/25/22 1508 05/27/22 1033  NA 140 142  K 3.4* 3.3*  CL 106 108  CO2 24 24  GLUCOSE 122* 104*  BUN 16 16  CREATININE 1.11* 1.04*  CALCIUM 8.0* 8.2*   LFT Recent Labs    05/25/22 1508  PROT 5.4*  ALBUMIN 2.9*  AST 39  ALT 23  ALKPHOS 60  BILITOT 0.9   PT/INR No results for input(s): "LABPROT", "INR" in the last 72 hours. Hepatitis Panel No results for input(s): "HEPBSAG", "HCVAB", "HEPAIGM", "HEPBIGM" in the last 72 hours. C-Diff No results for input(s): "CDIFFTOX" in the last 72 hours. Fecal Lactopherrin No results for input(s): "FECLLACTOFRN" in the last 72 hours.  Studies/Results: No results found.  Medications: Scheduled:  hydrocortisone sod succinate (SOLU-CORTEF) inj  25 mg Intravenous Q12H   pantoprazole  40 mg Oral BID   polyethylene glycol-electrolytes  4,000 mL Oral Once   predniSONE  10 mg Oral Q breakfast   Continuous:  azithromycin 500 mg (05/28/22 0051)   cefTRIAXone (ROCEPHIN)  IV 2 g (05/27/22 2314)    Assessment/Plan: 1) Hematochezia. 2) Bilateral PE. 3) Hypokalemia. 4) ILD.   The patient is  saturating at 96% on 5 liters of [place].  She appears comfortable.  She is high risk for the procedures, but further evaluation is required with the bilateral PEs and hematochezia with attempted anticoagulation.  The patient is agreeable to further work up.  Plan: 1) Correct hypokalemia. 2) EGD/colonoscopy tomorrow.  LOS: 4 days   Anthon Harpole D 05/28/2022, 8:57 AM

## 2022-05-29 ENCOUNTER — Inpatient Hospital Stay (HOSPITAL_COMMUNITY): Payer: Medicare Other | Admitting: Certified Registered"

## 2022-05-29 ENCOUNTER — Encounter (HOSPITAL_COMMUNITY): Admission: EM | Disposition: E | Payer: Self-pay | Source: Home / Self Care | Attending: Internal Medicine

## 2022-05-29 ENCOUNTER — Encounter (HOSPITAL_COMMUNITY): Payer: Self-pay | Admitting: Internal Medicine

## 2022-05-29 DIAGNOSIS — J189 Pneumonia, unspecified organism: Secondary | ICD-10-CM | POA: Diagnosis not present

## 2022-05-29 DIAGNOSIS — Z86718 Personal history of other venous thrombosis and embolism: Secondary | ICD-10-CM

## 2022-05-29 DIAGNOSIS — I2699 Other pulmonary embolism without acute cor pulmonale: Secondary | ICD-10-CM | POA: Diagnosis not present

## 2022-05-29 DIAGNOSIS — Z87891 Personal history of nicotine dependence: Secondary | ICD-10-CM

## 2022-05-29 DIAGNOSIS — D509 Iron deficiency anemia, unspecified: Secondary | ICD-10-CM

## 2022-05-29 DIAGNOSIS — D374 Neoplasm of uncertain behavior of colon: Secondary | ICD-10-CM | POA: Diagnosis not present

## 2022-05-29 DIAGNOSIS — K259 Gastric ulcer, unspecified as acute or chronic, without hemorrhage or perforation: Secondary | ICD-10-CM

## 2022-05-29 DIAGNOSIS — D175 Benign lipomatous neoplasm of intra-abdominal organs: Secondary | ICD-10-CM | POA: Diagnosis not present

## 2022-05-29 DIAGNOSIS — I10 Essential (primary) hypertension: Secondary | ICD-10-CM

## 2022-05-29 DIAGNOSIS — R7989 Other specified abnormal findings of blood chemistry: Secondary | ICD-10-CM | POA: Diagnosis not present

## 2022-05-29 DIAGNOSIS — C187 Malignant neoplasm of sigmoid colon: Secondary | ICD-10-CM

## 2022-05-29 DIAGNOSIS — K5730 Diverticulosis of large intestine without perforation or abscess without bleeding: Secondary | ICD-10-CM | POA: Diagnosis not present

## 2022-05-29 DIAGNOSIS — K922 Gastrointestinal hemorrhage, unspecified: Secondary | ICD-10-CM | POA: Diagnosis not present

## 2022-05-29 DIAGNOSIS — C188 Malignant neoplasm of overlapping sites of colon: Secondary | ICD-10-CM | POA: Diagnosis not present

## 2022-05-29 DIAGNOSIS — J8410 Pulmonary fibrosis, unspecified: Secondary | ICD-10-CM | POA: Diagnosis not present

## 2022-05-29 DIAGNOSIS — D127 Benign neoplasm of rectosigmoid junction: Secondary | ICD-10-CM

## 2022-05-29 DIAGNOSIS — K635 Polyp of colon: Secondary | ICD-10-CM | POA: Diagnosis not present

## 2022-05-29 HISTORY — PX: POLYPECTOMY: SHX5525

## 2022-05-29 HISTORY — PX: ESOPHAGOGASTRODUODENOSCOPY (EGD) WITH PROPOFOL: SHX5813

## 2022-05-29 HISTORY — PX: COLONOSCOPY WITH PROPOFOL: SHX5780

## 2022-05-29 HISTORY — PX: HEMOSTASIS CLIP PLACEMENT: SHX6857

## 2022-05-29 HISTORY — PX: BIOPSY: SHX5522

## 2022-05-29 HISTORY — PX: SUBMUCOSAL TATTOO INJECTION: SHX6856

## 2022-05-29 LAB — BASIC METABOLIC PANEL
Anion gap: 7 (ref 5–15)
BUN: 12 mg/dL (ref 8–23)
CO2: 22 mmol/L (ref 22–32)
Calcium: 7.9 mg/dL — ABNORMAL LOW (ref 8.9–10.3)
Chloride: 114 mmol/L — ABNORMAL HIGH (ref 98–111)
Creatinine, Ser: 0.93 mg/dL (ref 0.44–1.00)
GFR, Estimated: 59 mL/min — ABNORMAL LOW (ref >60)
Glucose, Bld: 134 mg/dL — ABNORMAL HIGH (ref 70–99)
Potassium: 4.5 mmol/L (ref 3.5–5.1)
Sodium: 143 mmol/L (ref 135–145)

## 2022-05-29 LAB — CULTURE, BLOOD (ROUTINE X 2)
Culture: NO GROWTH
Culture: NO GROWTH
Special Requests: ADEQUATE

## 2022-05-29 LAB — CBC
HCT: 37.6 % (ref 36.0–46.0)
Hemoglobin: 12.5 g/dL (ref 12.0–15.0)
MCH: 29.2 pg (ref 26.0–34.0)
MCHC: 33.2 g/dL (ref 30.0–36.0)
MCV: 87.9 fL (ref 80.0–100.0)
Platelets: 245 10*3/uL (ref 150–400)
RBC: 4.28 MIL/uL (ref 3.87–5.11)
RDW: 13.9 % (ref 11.5–15.5)
WBC: 12.8 10*3/uL — ABNORMAL HIGH (ref 4.0–10.5)
nRBC: 0 % (ref 0.0–0.2)

## 2022-05-29 SURGERY — ESOPHAGOGASTRODUODENOSCOPY (EGD) WITH PROPOFOL
Anesthesia: Monitor Anesthesia Care

## 2022-05-29 MED ORDER — SPOT INK MARKER SYRINGE KIT
PACK | SUBMUCOSAL | Status: DC | PRN
  Administered 2022-05-29: 5 mL via SUBMUCOSAL

## 2022-05-29 MED ORDER — PHENYLEPHRINE 80 MCG/ML (10ML) SYRINGE FOR IV PUSH (FOR BLOOD PRESSURE SUPPORT)
PREFILLED_SYRINGE | INTRAVENOUS | Status: DC | PRN
  Administered 2022-05-29 (×5): 80 ug via INTRAVENOUS

## 2022-05-29 MED ORDER — PROPOFOL 500 MG/50ML IV EMUL
INTRAVENOUS | Status: DC | PRN
  Administered 2022-05-29: 75 ug/kg/min via INTRAVENOUS

## 2022-05-29 MED ORDER — LIDOCAINE 2% (20 MG/ML) 5 ML SYRINGE
INTRAMUSCULAR | Status: DC | PRN
  Administered 2022-05-29: 40 mg via INTRAVENOUS

## 2022-05-29 MED ORDER — SPOT INK MARKER SYRINGE KIT
PACK | SUBMUCOSAL | Status: AC
  Filled 2022-05-29: qty 5

## 2022-05-29 MED ORDER — LACTATED RINGERS IV SOLN
INTRAVENOUS | Status: AC | PRN
  Administered 2022-05-29: 20 mL/h via INTRAVENOUS

## 2022-05-29 MED ORDER — FENTANYL CITRATE (PF) 100 MCG/2ML IJ SOLN: 25.0000 ug | INTRAMUSCULAR | Status: DC | PRN

## 2022-05-29 MED ORDER — ALBUTEROL SULFATE (2.5 MG/3ML) 0.083% IN NEBU
2.5000 mg | INHALATION_SOLUTION | Freq: Four times a day (QID) | RESPIRATORY_TRACT | Status: DC | PRN
  Administered 2022-05-29: 2.5 mg via RESPIRATORY_TRACT

## 2022-05-29 MED ORDER — OXYCODONE HCL 5 MG/5ML PO SOLN: 5.0000 mg | Freq: Once | ORAL | Status: DC | PRN

## 2022-05-29 MED ORDER — ALBUTEROL SULFATE (2.5 MG/3ML) 0.083% IN NEBU: 2.5000 mg | INHALATION_SOLUTION | Freq: Four times a day (QID) | RESPIRATORY_TRACT | Status: DC | PRN

## 2022-05-29 MED ORDER — ONDANSETRON HCL 4 MG/2ML IJ SOLN: 4.0000 mg | Freq: Four times a day (QID) | INTRAMUSCULAR | Status: DC | PRN

## 2022-05-29 MED ORDER — OXYCODONE HCL 5 MG PO TABS: 5.0000 mg | ORAL_TABLET | Freq: Once | ORAL | Status: DC | PRN

## 2022-05-29 SURGICAL SUPPLY — 25 items

## 2022-05-29 NOTE — Consult Note (Signed)
Reason for Consult: Sigmoid cancer Referring Physician: Dr. Priscille Loveless is an 89 y.o. female.  HPI: Patient is a 89 year old female, with history of pulmonary fibrosis-on 3 L of oxygen at home, RA, hyperlipidemia, new diagnosis of DVT and PE. Patient presented to the ER secondary to dyspnea.  Patient underwent workup and was found to have PE and subsequent DVT.  Patient was placed on antiplatelet medication.  Subsequently patient began with lower GI bleed.  Patient underwent endoscopy and was found to have a sigmoid rectal junction mass.  This was approximately 25%.  Please see Dr. Ulyses Amor note for full details.  General surgery was consulted for evaluation and consideration for possible resection. I did review the patient's CT scan, colonoscopy study, and laboratory studies.   Past Medical History:  Diagnosis Date   Colon, diverticulosis 03/16/2006   Hyperlipemia    Hypertension    IBS (irritable bowel syndrome)    Irregular heart rate    Personal history of colonic polyps 04/10/2006   hyperplastic   Pulmonary fibrosis (HCC)    Rheumatoid arthritis(714.0)     Past Surgical History:  Procedure Laterality Date   ABDOMINAL HYSTERECTOMY     APPENDECTOMY     CHOLECYSTECTOMY     CYSTECTOMY     left breast    Family History  Problem Relation Age of Onset   Heart disease Brother    Heart disease Sister    Liver disease Daughter    Diabetes Brother    Rheum arthritis Mother     Social History:  reports that she quit smoking about 34 years ago. Her smoking use included cigarettes. She has a 30.00 pack-year smoking history. She has never used smokeless tobacco. She reports that she does not drink alcohol and does not use drugs.  Allergies:  Allergies  Allergen Reactions   Dilaudid [Hydromorphone Hcl] Nausea And Vomiting   Penicillins Itching and Rash   Statins Itching    Medications: I have reviewed the patient's current medications.  Results for orders placed  or performed during the hospital encounter of 05/25/2022 (from the past 48 hour(s))  CBC     Status: Abnormal   Collection Time: 05/28/22 12:46 AM  Result Value Ref Range   WBC 12.8 (H) 4.0 - 10.5 K/uL   RBC 4.30 3.87 - 5.11 MIL/uL   Hemoglobin 12.6 12.0 - 15.0 g/dL   HCT 37.7 36.0 - 46.0 %   MCV 87.7 80.0 - 100.0 fL   MCH 29.3 26.0 - 34.0 pg   MCHC 33.4 30.0 - 36.0 g/dL   RDW 13.7 11.5 - 15.5 %   Platelets 268 150 - 400 K/uL   nRBC 0.4 (H) 0.0 - 0.2 %    Comment: Performed at [facility] [address], [place], [place] [address]  CBC     Status: Abnormal   Collection Time: 06/04/2022  1:10 AM  Result Value Ref Range   WBC 12.8 (H) 4.0 - 10.5 K/uL   RBC 4.28 3.87 - 5.11 MIL/uL   Hemoglobin 12.5 12.0 - 15.0 g/dL   HCT 37.6 36.0 - 46.0 %   MCV 87.9 80.0 - 100.0 fL   MCH 29.2 26.0 - 34.0 pg   MCHC 33.2 30.0 - 36.0 g/dL   RDW 13.9 11.5 - 15.5 %   Platelets 245 150 - 400 K/uL   nRBC 0.0 0.0 - 0.2 %    Comment: Performed at [facility], [place] [address], [place], [place]  [address]  Basic metabolic panel     Status: Abnormal   Collection Time: 06/02/2022  1:10 AM  Result Value Ref Range   Sodium 143 135 - 145 mmol/L   Potassium 4.5 3.5 - 5.1 mmol/L   Chloride 114 (H) 98 - 111 mmol/L   CO2 22 22 - 32 mmol/L   Glucose, Bld 134 (H) 70 - 99 mg/dL    Comment: Glucose reference range applies only to samples taken after fasting for at least 8 hours.   BUN 12 8 - 23 mg/dL   Creatinine, Ser 0.93 0.44 - 1.00 mg/dL   Calcium 7.9 (L) 8.9 - 10.3 mg/dL   GFR, Estimated 59 (L) >60 mL/min    Comment: (NOTE) Calculated using the CKD-EPI Creatinine Equation (2021)    Anion gap 7 5 - 15    Comment: Performed at [facility] [address] [place],  [address]    No results found.  Review of Systems  Constitutional:  Negative for chills and fever.  HENT:  Negative for ear discharge, hearing loss and sore throat.   Eyes:  Negative for discharge.  Respiratory:   Negative for cough and shortness of breath.   Cardiovascular:  Negative for chest pain and leg swelling.  Gastrointestinal:  Positive for blood in stool. Negative for abdominal pain, constipation, diarrhea, nausea and vomiting.  Musculoskeletal:  Negative for myalgias and neck pain.  Skin:  Negative for rash.  Allergic/Immunologic: Negative for environmental allergies.  Neurological:  Negative for dizziness and seizures.  Hematological:  Does not bruise/bleed easily.  Psychiatric/Behavioral:  Negative for suicidal ideas.   All other systems reviewed and are negative.  Blood pressure (!) 143/83, pulse 89, temperature 97.8 F (36.6 C), temperature source Oral, resp. rate 18, height '5\' 3"'$  (1.6 m), weight 79.8 kg, SpO2 90 %. Physical Exam Constitutional:      Appearance: She is well-developed.     Comments: Conversant No acute distress  HENT:     Head: Normocephalic and atraumatic.  Eyes:     General: Lids are normal. No scleral icterus.    Pupils: Pupils are equal, round, and reactive to light.     Comments: Pupils are equal round and reactive No lid lag Moist conjunctiva  Neck:     Thyroid: No thyromegaly.     Trachea: No tracheal tenderness.     Comments: No cervical lymphadenopathy Cardiovascular:     Rate and Rhythm: Normal rate and regular rhythm.     Heart sounds: No murmur heard. Pulmonary:     Effort: Pulmonary effort is normal.     Breath sounds: Normal breath sounds. No wheezing or rales.  Abdominal:     Tenderness: There is no abdominal tenderness.     Hernia: No hernia is present.  Musculoskeletal:     Cervical back: Normal range of motion and neck supple.  Skin:    General: Skin is warm.     Findings: No rash.     Nails: There is no clubbing.     Comments: Normal skin turgor  Neurological:     Mental Status: She is alert and oriented to person, place, and time.     Comments: Normal gait and station  Psychiatric:        Mood and Affect: Mood normal.         Thought Content: Thought content normal.        Judgment: Judgment normal.     Comments: Appropriate affect  Assessment/Plan: 89 year old female, with a rectal cancer, new diagnosis of PE and DVT History of pulmonary fibrosis History of rheumatoid arthritis  1.  I discussed with the patient and her daughter in the room we discussed the colonoscopy findings.  I discussed with him that considering her pulmonary fibrosis to be a extremely high risk surgical candidate and likely require postoperative intubation for prolonged period of time.  I discussed with her that at this point her colon cancer is not obstructive.  Both her and her daughter stated they would not want surgery.  I did discuss with him that proceeding with IVC filter would be reasonable to prevent any further PEs.  Also discussed with him that there is a possibility that she may benefit from palliative radiation therapy.  She would need consultation with hematology oncology.  2.  Please call us back if we can offer any further assistance. Ralene Ok 05/23/2022, 11:39 AM

## 2022-05-29 NOTE — Progress Notes (Signed)
[facility] PROGRESS NOTE    Progress Note  Veronica Stone  OZH:[id number] DOB: 1931-11-08 DOA: 06/07/2022 PCP: Deland Pretty, MD     Brief Narrative:   Veronica Stone is an 89 y.o. female past medical history significant for pulmonary fibrosis on 4 L of oxygen at home on steroids, there is probably due to rheumatoid arthritis for which she is on Remicade, history of AAA, gastric ulcer, history of DVT comes in with shortness of breath that started about 2 weeks prior to admission cyanotic on arrival satting 80% in the ED placed on 12 L of oxygen, CT angio of the chest showed bilateral PE, new patchy groundglass opacity bilaterally.  Assessment/Plan:   Acute on chronic respiratory failure with hypoxia due to PE: Lower extremity Doppler showed bilateral DVT. PT eval recommended home health PT She is high risk for procedure she agreed to proceed with further workup.  New acute lower GI bleed: GI was consulted for EGD and colonoscopy performed EGD that showed nonbleeding gastric ulcer with clean base biopsies were taken. Colonoscopy was done that showed malignant tumor of the sigmoid tattooed 10 mm polyp was removed biopsies were taken. CEA was sent. This becomes a difficult situation as we cannot put her on anticoagulation she will need an IVC filter.  Will consult IR to try to place a filter. The patient wants me in the room to tell the family about the new finding.  She is not a surgical candidate due to her pulmonary status, will get surgery involved to evaluate her, is unlikely she will qualify for surgical intervention at her age with her advanced pulmonary fibrosis, but the patient will still like to know.  History of rheumatoid arthritis: Continue Remicade and continue taper down steroids.  Hypotension: Resolved Culture sent on 06/06/2022 has been negative till date. Completed 5-day course of Rocephin.  Acute kidney injury: Likely prerenal azotemia with a baseline  creatinine of less than 1 on admission 1.4. It is improving basic metabolic panels pending this morning.  Chronic diastolic heart failure: Continue to hold diuretic therapy, he appears euvolemic.  Elevated troponins: She denies any chest pain likely demand ischemia. 2D echo is no WMA. Twelve-lead EKG showed no signs of ischemia.  Essential hypertension: Continue hold diuretic therapy and ARB. KVO IV fluids.  Hypokalemia: Low again today replete orally recheck tomorrow morning.    DVT prophylaxis: Holding anticoagulation due to GI bleed Family Communication:none Status is: Inpatient Remains inpatient appropriate because: Acute on chronic respiratory failure with hypoxia due to PE    Code Status:     Code Status Orders  (From admission, onward)           Start     Ordered   05/25/22 0100  Full code  Continuous       Question:  By:  Answer:  Consent: discussion documented in EHR   05/25/22 0100           Code Status History     This patient has a current code status but no historical code status.      Advance Directive Documentation    [place] Most Recent Value  Type of Advance Directive Living will  Pre-existing out of facility DNR order (yellow form or pink MOST form) --  "MOST" Form in Place? --         IV Access:   Peripheral IV   Procedures and diagnostic studies:   No results found.   Medical Consultants:   None.  Subjective:    Veronica Stone has no complaints, she is in a sad mood due to the news.  Objective:    Vitals:   05/17/2022 0830 06/08/2022 0845 05/23/2022 0900 06/09/2022 0928  BP: 133/87 (!) 148/99 (!) 155/98 (!) 143/83  Pulse: 90 79 81 89  Resp: (!) 30 (!) 21 (!) 22 18  Temp: 98.6 F (37 C)  (!) 97.4 F (36.3 C) 97.8 F (36.6 C)  TempSrc:    Oral  SpO2: (!) 83% 99% 99% 90%  Weight:      Height:       SpO2: 90 % O2 Flow Rate (L/min): 6 L/min   Intake/Output Summary (Last 24 hours) at 06/13/2022  1008 Last data filed at 05/28/2022 0809 Gross per 24 hour  Intake 1016.3 ml  Output 0 ml  Net 1016.3 ml    Filed Weights   05/27/2022 1917  Weight: 79.8 kg    Exam: General exam: In no acute distress. Respiratory system: Good air movement and clear to auscultation. Cardiovascular system: S1 & S2 heard, RRR. No JVD. Gastrointestinal system: Abdomen is nondistended, soft and nontender.  Extremities: No pedal edema. Skin: No rashes, lesions or ulcers Psychiatry: Judgement and insight appear normal. Mood & affect appropriate.  Data Reviewed:    Labs: Basic Metabolic Panel: Recent Labs  Lab 05/17/2022 1936 06/07/2022 1951 05/25/22 0109 05/25/22 1508 05/27/22 1033 05/16/2022 0110  NA 138 136  --  140 142 143  K 3.9 4.2  --  3.4* 3.3* 4.5  CL 97*  --   --  106 108 114*  CO2 23  --   --  '24 24 22  '$ GLUCOSE 165*  --   --  122* 104* 134*  BUN 17  --   --  '16 16 12  '$ CREATININE 1.43*  --   --  1.11* 1.04* 0.93  CALCIUM 9.1  --   --  8.0* 8.2* 7.9*  MG  --   --  2.1 2.3  --   --   PHOS  --   --  3.6 3.5  --   --     GFR Estimated Creatinine Clearance: 41.9 mL/min (by C-G formula based on SCr of 0.93 mg/dL). Liver Function Tests: Recent Labs  Lab 06/12/2022 1936 05/25/22 1508  AST 60* 39  ALT 27 23  ALKPHOS 66 60  BILITOT 1.0 0.9  PROT 5.9* 5.4*  ALBUMIN 3.4* 2.9*    No results for input(s): "LIPASE", "AMYLASE" in the last 168 hours. No results for input(s): "AMMONIA" in the last 168 hours. Coagulation profile Recent Labs  Lab 05/25/22 0009  INR 1.3*    COVID-19 Labs  No results for input(s): "DDIMER", "FERRITIN", "LDH", "CRP" in the last 72 hours.  Lab Results  Component Value Date   [place] NEGATIVE 05/27/2022    CBC: Recent Labs  Lab 05/20/2022 1936 06/10/2022 1951 05/26/22 0417 05/27/22 0121 05/27/22 1033 05/28/22 0046 06/06/2022 0110  WBC 18.0*   < > 22.4* 16.2* 14.3* 12.8* 12.8*  NEUTROABS 14.0*  --   --   --   --   --   --   HGB 15.6*   < > 13.3  13.0 12.9 12.6 12.5  HCT 48.8*   < > 40.6 38.8 39.7 37.7 37.6  MCV 88.7   < > 87.5 86.0 88.4 87.7 87.9  PLT 278   < > 248 279 282 268 245   < > = values in this interval not displayed.  Cardiac Enzymes: Recent Labs  Lab 05/25/22 0109  CKTOTAL 136    BNP (last 3 results) No results for input(s): "PROBNP" in the last 8760 hours. CBG: No results for input(s): "GLUCAP" in the last 168 hours. D-Dimer: No results for input(s): "DDIMER" in the last 72 hours. Hgb A1c: No results for input(s): "HGBA1C" in the last 72 hours. Lipid Profile: No results for input(s): "CHOL", "HDL", "LDLCALC", "TRIG", "CHOLHDL", "LDLDIRECT" in the last 72 hours. Thyroid function studies: No results for input(s): "TSH", "T4TOTAL", "T3FREE", "THYROIDAB" in the last 72 hours.  Invalid input(s): "FREET3"  Anemia work up: No results for input(s): "VITAMINB12", "FOLATE", "FERRITIN", "TIBC", "IRON", "RETICCTPCT" in the last 72 hours. Sepsis Labs: Recent Labs  Lab 06/01/2022 1915 05/26/2022 1936 06/13/2022 2115 05/25/22 0009 05/25/22 0109 05/25/22 0220 05/25/22 0630 05/26/22 0417 05/27/22 0121 05/27/22 1033 05/28/22 0046 06/07/2022 0110  PROCALCITON  --   --   --   --  3.58  --   --  3.29  --   --   --   --   WBC  --    < >  --   --   --   --    < > 22.4* 16.2* 14.3* 12.8* 12.8*  LATICACIDVEN 5.2*  --  2.6* 2.1*  --  1.9  --   --   --   --   --   --    < > = values in this interval not displayed.    Microbiology Recent Results (from the past 240 hour(s))  Resp panel by RT-PCR (RSV, Flu A&B, Covid) Anterior Nasal Swab     Status: None   Collection Time: 06/07/2022  7:15 PM   Specimen: Anterior Nasal Swab  Result Value Ref Range Status   SARS Coronavirus 2 by RT PCR NEGATIVE NEGATIVE Final    Comment: (NOTE) SARS-CoV-2 target nucleic acids are NOT DETECTED.  The SARS-CoV-2 RNA is generally detectable in upper respiratory specimens during the acute phase of infection. The lowest concentration of  SARS-CoV-2 viral copies this assay can detect is 138 copies/mL. A negative result does not preclude SARS-Cov-2 infection and should not be used as the sole basis for treatment or other patient management decisions. A negative result may occur with  improper specimen collection/handling, submission of specimen other than nasopharyngeal swab, presence of viral mutation(s) within the areas targeted by this assay, and inadequate number of viral copies(<138 copies/mL). A negative result must be combined with clinical observations, patient history, and epidemiological information. The expected result is Negative.  Fact Sheet for Patients:  EntrepreneurPulse.com.au  Fact Sheet for Healthcare Providers:  IncredibleEmployment.be  This test is no t yet approved or cleared by the [place] FDA and  has been authorized for detection and/or diagnosis of SARS-CoV-2 by FDA under an Emergency Use Authorization (EUA). This EUA will remain  in effect (meaning this test can be used) for the duration of the COVID-19 declaration under Section 564(b)(1) of the Act, 21 U.S.C.section 360bbb-3(b)(1), unless the authorization is terminated  or revoked sooner.       Influenza A by PCR NEGATIVE NEGATIVE Final   Influenza B by PCR NEGATIVE NEGATIVE Final    Comment: (NOTE) The Xpert Xpress SARS-CoV-2/FLU/RSV plus assay is intended as an aid in the diagnosis of influenza from Nasopharyngeal swab specimens and should not be used as a sole basis for treatment. Nasal washings and aspirates are unacceptable for Xpert Xpress SARS-CoV-2/FLU/RSV testing.  Fact Sheet for Patients: EntrepreneurPulse.com.au  Fact Sheet  for Healthcare Providers: IncredibleEmployment.be  This test is not yet approved or cleared by the [place] and has been authorized for detection and/or diagnosis of SARS-CoV-2 by FDA under an Emergency Use  Authorization (EUA). This EUA will remain in effect (meaning this test can be used) for the duration of the COVID-19 declaration under Section 564(b)(1) of the Act, 21 U.S.C. section 360bbb-3(b)(1), unless the authorization is terminated or revoked.     Resp Syncytial Virus by PCR NEGATIVE NEGATIVE Final    Comment: (NOTE) Fact Sheet for Patients: EntrepreneurPulse.com.au  Fact Sheet for Healthcare Providers: IncredibleEmployment.be  This test is not yet approved or cleared by the [place] FDA and has been authorized for detection and/or diagnosis of SARS-CoV-2 by FDA under an Emergency Use Authorization (EUA). This EUA will remain in effect (meaning this test can be used) for the duration of the COVID-19 declaration under Section 564(b)(1) of the Act, 21 U.S.C. section 360bbb-3(b)(1), unless the authorization is terminated or revoked.  Performed at [facility], [place] [address], [place], [place] [address]   Blood culture (routine x 2)     Status: None   Collection Time: 06/09/2022  7:36 PM   Specimen: BLOOD  Result Value Ref Range Status   Specimen Description BLOOD RIGHT ANTECUBITAL  Final   Special Requests   Final    BOTTLES DRAWN AEROBIC AND ANAEROBIC Blood Culture adequate volume   Culture   Final    NO GROWTH 5 DAYS Performed at [facility], [place] [address] [place], [place] [address]    Report Status 06/13/2022 FINAL  Final  Blood culture (routine x 2)     Status: None   Collection Time: 05/28/2022  7:53 PM   Specimen: BLOOD  Result Value Ref Range Status   Specimen Description BLOOD LEFT ANTECUBITAL  Final   Special Requests   Final    BOTTLES DRAWN AEROBIC AND ANAEROBIC Blood Culture results may not be optimal due to an inadequate volume of blood received in culture bottles   Culture   Final    NO GROWTH 5 DAYS Performed at [facility], [place] [address] [place], [place] [address]    Report Status  05/18/2022 FINAL  Final  Respiratory (~20 pathogens) panel by PCR     Status: None   Collection Time: 05/25/22  1:12 AM   Specimen: Nasopharyngeal Swab; Respiratory  Result Value Ref Range Status   Adenovirus NOT DETECTED NOT DETECTED Final   Coronavirus 229E NOT DETECTED NOT DETECTED Final    Comment: (NOTE) The Coronavirus on the Respiratory Panel, DOES NOT test for the novel  Coronavirus (2019 nCoV)    Coronavirus HKU1 NOT DETECTED NOT DETECTED Final   Coronavirus NL63 NOT DETECTED NOT DETECTED Final   Coronavirus OC43 NOT DETECTED NOT DETECTED Final   Metapneumovirus NOT DETECTED NOT DETECTED Final   Rhinovirus / Enterovirus NOT DETECTED NOT DETECTED Final   Influenza A NOT DETECTED NOT DETECTED Final   Influenza B NOT DETECTED NOT DETECTED Final   Parainfluenza Virus 1 NOT DETECTED NOT DETECTED Final   Parainfluenza Virus 2 NOT DETECTED NOT DETECTED Final   Parainfluenza Virus 3 NOT DETECTED NOT DETECTED Final   Parainfluenza Virus 4 NOT DETECTED NOT DETECTED Final   Respiratory Syncytial Virus NOT DETECTED NOT DETECTED Final   Bordetella pertussis NOT DETECTED NOT DETECTED Final   Bordetella Parapertussis NOT DETECTED NOT DETECTED Final   Chlamydophila pneumoniae NOT DETECTED NOT DETECTED Final  Mycoplasma pneumoniae NOT DETECTED NOT DETECTED Final    Comment: Performed at [facility], [place] [address], [place], [place] [address]     Medications:    pantoprazole  40 mg Oral BID   predniSONE  10 mg Oral Q breakfast   Continuous Infusions:  azithromycin 500 mg (06/13/2022 0007)      LOS: 5 days   Charlynne Cousins  [facility]  05/20/2022, 10:08 AM

## 2022-05-29 NOTE — Transfer of Care (Signed)
Immediate Anesthesia Transfer of Care Note  Patient: Civil engineer, contracting  Procedure(s) Performed: ESOPHAGOGASTRODUODENOSCOPY (EGD) WITH PROPOFOL COLONOSCOPY WITH PROPOFOL BIOPSY POLYPECTOMY  Patient Location: PACU  Anesthesia Type:MAC  Level of Consciousness: awake, alert , oriented, and patient cooperative  Airway & Oxygen Therapy: Patient Spontanous Breathing and Patient connected to face mask oxygen  Post-op Assessment: Report given to RN and Post -op Vital signs reviewed and stable  Post vital signs: Reviewed and stable  Last Vitals:  Vitals Value Taken Time  BP 133/87 05/18/2022 0830  Temp    Pulse 86 06/10/2022 0831  Resp 29 06/06/2022 0831  SpO2 86 % 06/09/2022 0831  Vitals shown include unvalidated device data.  Last Pain:  Vitals:   06/08/2022 0715  TempSrc: Temporal  PainSc: 0-No pain         Complications: No notable events documented.Patient sat remains 86-88% on face mask, Dr. Marcie Bal aware, respiratory rate 24-26, awake and alert.

## 2022-05-29 NOTE — Anesthesia Postprocedure Evaluation (Signed)
Anesthesia Post Note  Patient: Civil engineer, contracting  Procedure(s) Performed: ESOPHAGOGASTRODUODENOSCOPY (EGD) WITH PROPOFOL COLONOSCOPY WITH PROPOFOL BIOPSY POLYPECTOMY     Patient location during evaluation: PACU Anesthesia Type: MAC Level of consciousness: awake and alert Pain management: pain level controlled Vital Signs Assessment: post-procedure vital signs reviewed and stable Respiratory status: spontaneous breathing, nonlabored ventilation, respiratory function stable and patient connected to nasal cannula oxygen Cardiovascular status: stable and blood pressure returned to baseline Postop Assessment: no apparent nausea or vomiting Anesthetic complications: no   No notable events documented.  Last Vitals:  Vitals:   06/10/2022 0900 05/31/2022 0928  BP: (!) 155/98 (!) 143/83  Pulse: 81 89  Resp: (!) 22 18  Temp: (!) 36.3 C 36.6 C  SpO2: 99% 90%    Last Pain:  Vitals:   06/05/2022 0928  TempSrc: Oral  PainSc: 0-No pain                 Hank Walling S

## 2022-05-29 NOTE — Op Note (Addendum)
[facility] Patient Name: Veronica Stone Procedure Date : 05/20/2022 MRN: [id number] Attending MD: Carol Ada , MD, [id number] Date of Birth: 06/15/1931 CSN: [id number] Age: 89 Admit Type: Inpatient Procedure:                Upper GI endoscopy Indications:              Iron deficiency anemia, Hematochezia Providers:                Carol Ada, MD, Grace Isaac, RN, Gloris Ham, Technician Referring MD:              Medicines:                Propofol per Anesthesia Complications:            No immediate complications. Estimated Blood Loss:     Estimated blood loss: none. Procedure:                Pre-Anesthesia Assessment:                           - Prior to the procedure, a History and Physical                            was performed, and patient medications and                            allergies were reviewed. The patient's tolerance of                            previous anesthesia was also reviewed. The risks                            and benefits of the procedure and the sedation                            options and risks were discussed with the patient.                            All questions were answered, and informed consent                            was obtained. Prior Anticoagulants: The patient has                            taken no anticoagulant or antiplatelet agents. ASA                            Grade Assessment: III - A patient with severe                            systemic disease. After reviewing the risks and  benefits, the patient was deemed in satisfactory                            condition to undergo the procedure.                           - Sedation was administered by an anesthesia                            professional. Deep sedation was attained.                           After obtaining informed consent, the endoscope was                            passed under  direct vision. Throughout the                            procedure, the patient's blood pressure, pulse, and                            oxygen saturations were monitored continuously. The                            PCF-HQ190TL ([id number] Olympus peds colonoscope was                            introduced through the mouth, and advanced to the                            second part of duodenum. The upper GI endoscopy was                            accomplished without difficulty. The patient                            tolerated the procedure well. Scope In: Scope Out: Findings:      The esophagus was normal.      One non-bleeding cratered gastric ulcer with a clean ulcer base (Forrest       Class III) was found in the gastric antrum. The lesion was 10 mm in       largest dimension. Biopsies were taken with a cold forceps for       Helicobacter pylori testing.      There was a small lipoma in the second portion of the duodenum.      A benign appearing distal antral ulcer was found. It was clean-based.       There was a mild gastritis and cold biopsies were obtained to check for       H. pylori. Impression:               - Normal esophagus.                           - Non-bleeding gastric ulcer with a clean ulcer  base (Forrest Class III). Biopsied.                           - Duodenal lipoma. Recommendation:           - Proceed with the colonoscopy.                           - Continue with the PPI.                           - Follow up biopsy results. Procedure Code(s):        --- Professional ---                           [phone], Esophagogastroduodenoscopy, flexible,                            transoral; with biopsy, single or multiple Diagnosis Code(s):        --- Professional ---                           K25.9, Gastric ulcer, unspecified as acute or                            chronic, without hemorrhage or perforation                           D17.5, Benign  lipomatous neoplasm of                            intra-abdominal organs                           D50.9, Iron deficiency anemia, unspecified                           K92.1, Melena (includes Hematochezia) CPT copyright 2022 American Medical Association. All rights reserved. The codes documented in this report are preliminary and upon coder review may  be revised to meet current compliance requirements. Carol Ada, MD Carol Ada, MD 05/16/2022 8:44:59 AM This report has been signed electronically. Number of Addenda: 0

## 2022-05-29 NOTE — Progress Notes (Signed)
Patient is picked up by Endo RN, monitor removed and in the room, CCMD notified.

## 2022-05-29 NOTE — Op Note (Signed)
[facility] Patient Name: Veronica Stone Procedure Date : 06/13/2022 MRN: [id number] Attending MD: Carol Ada , MD, [id number] Date of Birth: [birth year] CSN: [id number] Age: 89 Admit Type: Inpatient Procedure:                Colonoscopy Indications:              Hematochezia Providers:                Carol Ada, MD, Grace Isaac, RN, Gloris Ham, Technician Referring MD:              Medicines:                Propofol per Anesthesia Complications:            No immediate complications. Estimated Blood Loss:     Estimated blood loss: none. Procedure:                Pre-Anesthesia Assessment:                           - Prior to the procedure, a History and Physical                            was performed, and patient medications and                            allergies were reviewed. The patient's tolerance of                            previous anesthesia was also reviewed. The risks                            and benefits of the procedure and the sedation                            options and risks were discussed with the patient.                            All questions were answered, and informed consent                            was obtained. Prior Anticoagulants: The patient has                            taken no anticoagulant or antiplatelet agents. ASA                            Grade Assessment: III - A patient with severe                            systemic disease. After reviewing the risks and                            benefits, the  patient was deemed in satisfactory                            condition to undergo the procedure.                           - Sedation was administered by an anesthesia                            professional. Deep sedation was attained.                           After obtaining informed consent, the colonoscope                            was passed under direct vision. Throughout the                             procedure, the patient's blood pressure, pulse, and                            oxygen saturations were monitored continuously. The                            PCF-HQ190TL ([id number]) Olympus peds colonoscope was                            introduced through the anus and advanced to the the                            cecum, identified by appendiceal orifice and                            ileocecal valve. The colonoscopy was somewhat                            difficult due to significant looping. Successful                            completion of the procedure was aided by using                            manual pressure and straightening and shortening                            the scope to obtain bowel loop reduction. The                            patient tolerated the procedure well. The quality                            of the bowel preparation was evaluated using the  BBPS [facility] Bowel Preparation Scale) with scores                            of: Right Colon = 2 (minor amount of residual                            staining, small fragments of stool and/or opaque                            liquid, but mucosa seen well), Transverse Colon = 2                            (minor amount of residual staining, small fragments                            of stool and/or opaque liquid, but mucosa seen                            well) and Left Colon = 2 (minor amount of residual                            staining, small fragments of stool and/or opaque                            liquid, but mucosa seen well). The total BBPS score                            equals 6. The quality of the bowel preparation was                            good. The ileocecal valve, appendiceal orifice, and                            rectum were photographed. Scope In: 7:52:29 AM Scope Out: 8:19:24 AM Scope Withdrawal Time: 0 hours 12 minutes 10 seconds  Total  Procedure Duration: 0 hours 26 minutes 55 seconds  Findings:      An infiltrative non-obstructing medium-sized mass was found in the       sigmoid colon. The mass was non-circumferential. The mass measured two       cm in length. No bleeding was present. This was biopsied with a cold       forceps for histology. Area was tattooed with an injection of 5 mL of       Spot (carbon black).      A 10 mm polyp was found in the recto-sigmoid colon. The polyp was       semi-sessile. The polyp was removed with a cold snare. Resection and       retrieval were complete. To prevent bleeding post-intervention, two       hemostatic clips were successfully placed (MR safe). Clip manufacturer:       [employer]. There was no bleeding at the end of the procedure.      Scattered large-mouthed diverticula were found in the sigmoid colon,       descending colon  and transverse colon.      Before identifying the sigmoid colon mass a 1 cm semi-sessile colonic       polyp was identified in the rectosigmoid colon. The polyp was resected       with a cold snare and two hemoclips were used to secure the polypectomy       site in anticipation for anticoagulation. However, a medium-sized       infiltrative mass was found in the sigmoid colon. It occupied only 25%       of the circumfirence. Multiple cold biopsies were obtained and the area       was tattooed proximally on the opposite wall and distal to the lesion on       the same side of the lesion. Several other smaller polyps were noted in       the transverse and descending colon and they were deliberately not       removed. Impression:               - Malignant tumor in the sigmoid colon. Biopsied.                            Tattooed.                           - One 10 mm polyp at the recto-sigmoid colon,                            removed with a cold snare. Resected and retrieved.                            Clip manufacturer: [employer]. Clips (MR                             safe) were placed.                           - Diverticulosis in the sigmoid colon, in the                            descending colon and in the transverse colon. Recommendation:           - Return patient to hospital ward for ongoing care.                           - Resume regular diet.                           - Continue present medications.                           - Await pathology results.                           - Check CEA.                           - Consult Surgery and Oncology. Procedure Code(s):        --- Professional ---  [phone], Colonoscopy, flexible; with removal of                            tumor(s), polyp(s), or other lesion(s) by snare                            technique                           45380, 59, Colonoscopy, flexible; with biopsy,                            single or multiple                           45381, Colonoscopy, flexible; with directed                            submucosal injection(s), any substance Diagnosis Code(s):        --- Professional ---                           C18.7, Malignant neoplasm of sigmoid colon                           D12.7, Benign neoplasm of rectosigmoid junction                           K92.1, Melena (includes Hematochezia)                           K57.30, Diverticulosis of large intestine without                            perforation or abscess without bleeding CPT copyright 2022 American Medical Association. All rights reserved. The codes documented in this report are preliminary and upon coder review may  be revised to meet current compliance requirements. Carol Ada, MD Carol Ada, MD 06/01/2022 8:40:07 AM This report has been signed electronically. Number of Addenda: 0

## 2022-05-29 NOTE — Interval H&P Note (Signed)
History and Physical Interval Note:  06/09/2022 7:40 AM  Veronica Stone  has presented today for surgery, with the diagnosis of Hematochezia and anemia.  The various methods of treatment have been discussed with the patient and family. After consideration of risks, benefits and other options for treatment, the patient has consented to  Procedure(s): ESOPHAGOGASTRODUODENOSCOPY (EGD) WITH PROPOFOL (N/A) COLONOSCOPY WITH PROPOFOL (N/A) as a surgical intervention.  The patient's history has been reviewed, patient examined, no change in status, stable for surgery.  I have reviewed the patient's chart and labs.  Questions were answered to the patient's satisfaction.     Verma Grothaus D

## 2022-05-29 NOTE — Anesthesia Preprocedure Evaluation (Signed)
Anesthesia Evaluation  Patient identified by MRN, date of birth, ID band Patient awake    Reviewed: Allergy & Precautions, H&P , NPO status , Patient's Chart, lab work & pertinent test results  Airway Mallampati: II   Neck ROM: full    Dental   Pulmonary former smoker Pulmonary fibrosis   breath sounds clear to auscultation       Cardiovascular hypertension,  Rhythm:regular Rate:Normal     Neuro/Psych    GI/Hepatic GI bleeding   Endo/Other    Renal/GU      Musculoskeletal  (+) Arthritis ,    Abdominal   Peds  Hematology   Anesthesia Other Findings   Reproductive/Obstetrics                             Anesthesia Physical Anesthesia Plan  ASA: 3  Anesthesia Plan: MAC   Post-op Pain Management:    Induction: Intravenous  PONV Risk Score and Plan: 2 and Propofol infusion and Treatment may vary due to age or medical condition  Airway Management Planned: Simple Face Mask  Additional Equipment:   Intra-op Plan:   Post-operative Plan:   Informed Consent: I have reviewed the patients History and Physical, chart, labs and discussed the procedure including the risks, benefits and alternatives for the proposed anesthesia with the patient or authorized representative who has indicated his/her understanding and acceptance.     Dental advisory given  Plan Discussed with: CRNA, Anesthesiologist and Surgeon  Anesthesia Plan Comments:        Anesthesia Quick Evaluation

## 2022-05-29 NOTE — Consult Note (Signed)
[facility] [phone]   [phone]  CONSULT NOTE  REFERRING PHYSICIAN: Dr. Charlynne Cousins  REASON FOR CONSULTATION:  89 years old white female with suspicious colon cancer and bilateral pulmonary embolism  HPI Veronica Stone is a 89 y.o. female with past medical history significant for interstitial lung disease, hypertension, dyslipidemia, colon diverticulosis, irregular heart rate as well as rheumatoid arthritis.  The patient has been on home oxygen for the interstitial lung disease but for the last few days she has been getting more short of breath.  Her daughter called EMS and was transferred to [facility] for further evaluation.  During her evaluation, she had CT angiogram of the chest on May 24, 2022 and that showed bilateral upper lobe lobar and segmental pulmonary emboli positive for acute pulmonary embolism with CT evidence of right heart strain consistent with at least submassive, intermediate risk PE.  The scan also showed new patchy groundglass opacities in the bilateral upper lobes likely infectious/inflammatory and finding compatible with chronic interstitial lung disease.  The patient started treatment with anticoagulation with IV heparin but she developed hematochezia.  She was seen by Dr. Benson Norway from gastroenterology and she underwent EGD and colonoscopy earlier today.  The colonoscopy showed malignant tumor in the sigmoid colon that was biopsied there was also one 10 mm polyp at the rectosigmoid colon that was removed with cold snare.  The upper endoscopy showed 1 nonbleeding cratered gastric ulcer with a clean ulcer base in the gastric antrum that was also biopsied there was a benign-appearing distal antral ulcer with mild gastritis.  The patient was seen by general surgery and she is not a candidate for surgical resection of the colon tumor because of her age and comorbidities and she will not be able to tolerate intubation and  anesthesia.  I was consulted to give recommendation regarding her anticoagulation especially with the potential bleeding from the colon cancer.  When seen today she is feeling fine except for the baseline shortness of breath but she has no current nausea, vomiting, diarrhea or constipation.  She has no fever or chills.  She was accompanied by her 3 daughters at the bedside. Family history significant for mother with rheumatoid arthritis.  Brother and sister had heart disease. The patient is divorced and had 5 children with 3 living daughters.  She has a history for smoking but quit long time ago and she has no history of alcohol or drug abuse.  HPI  Past Medical History:  Diagnosis Date   Colon, diverticulosis 03/16/2006   Hyperlipemia    Hypertension    IBS (irritable bowel syndrome)    Irregular heart rate    Personal history of colonic polyps 04/10/2006   hyperplastic   Pulmonary fibrosis (HCC)    Rheumatoid arthritis(714.0)     Past Surgical History:  Procedure Laterality Date   ABDOMINAL HYSTERECTOMY     APPENDECTOMY     CHOLECYSTECTOMY     CYSTECTOMY     left breast    Family History  Problem Relation Age of Onset   Heart disease Brother    Heart disease Sister    Liver disease Daughter    Diabetes Brother    Rheum arthritis Mother     Social History Social History   Tobacco Use   Smoking status: Former    Packs/day: 1.00    Years: 30.00    Total pack years: 30.00    Types: Cigarettes    Quit  date: 05/16/1988    Years since quitting: 34.0   Smokeless tobacco: Never  Vaping Use   Vaping Use: Never used  Substance Use Topics   Alcohol use: No   Drug use: No    Allergies  Allergen Reactions   Dilaudid [Hydromorphone Hcl] Nausea And Vomiting   Penicillins Itching and Rash   Statins Itching    Current Facility-Administered Medications  Medication Dose Route Frequency Provider Last Rate Last Admin   acetaminophen (TYLENOL) tablet 650 mg  650 mg Oral Q6H  PRN Carol Ada, MD       Or   acetaminophen (TYLENOL) suppository 650 mg  650 mg Rectal Q6H PRN Carol Ada, MD       azithromycin (ZITHROMAX) 500 mg in sodium chloride 0.9 % 250 mL IVPB  500 mg Intravenous Q24H Carol Ada, MD 250 mL/hr at 06/02/2022 0007 500 mg at 05/28/2022 0007   loratadine (CLARITIN) tablet 10 mg  10 mg Oral Daily PRN Carol Ada, MD       pantoprazole (PROTONIX) EC tablet 40 mg  40 mg Oral BID Carol Ada, MD   40 mg at 06/02/2022 0946   predniSONE (DELTASONE) tablet 10 mg  10 mg Oral Q breakfast Carol Ada, MD   10 mg at 05/28/22 9449    Review of Systems  Constitutional: positive for fatigue Eyes: negative Ears, nose, mouth, throat, and face: negative Respiratory: positive for dyspnea on exertion Cardiovascular: negative Gastrointestinal: negative Genitourinary:negative Integument/breast: negative Hematologic/lymphatic: negative Musculoskeletal:negative Neurological: negative Behavioral/Psych: negative Endocrine: negative Allergic/Immunologic: negative  Physical Exam  QPR:FFMBW, healthy, no distress, well nourished, and well developed SKIN: skin color, texture, turgor are normal, no rashes or significant lesions HEAD: Normocephalic, No masses, lesions, tenderness or abnormalities EYES: normal, PERRLA, Conjunctiva are pink and non-injected EARS: External ears normal, Canals clear OROPHARYNX:no exudate, no erythema, and lips, buccal mucosa, and tongue normal  NECK: supple, no adenopathy, no JVD LYMPH:  no palpable lymphadenopathy, no hepatosplenomegaly BREAST:not examined LUNGS: clear to auscultation , and palpation HEART: regular rate & rhythm and no murmurs ABDOMEN:abdomen soft, non-tender, normal bowel sounds, and no masses or organomegaly BACK: Back symmetric, no curvature., No CVA tenderness EXTREMITIES:no joint deformities, effusion, or inflammation, no edema  NEURO: alert & oriented x 3 with fluent speech, no focal motor/sensory  deficits   LABORATORY DATA: Lab Results  Component Value Date   WBC 12.8 (H) 06/09/2022   HGB 12.5 06/05/2022   HCT 37.6 06/04/2022   MCV 87.9 05/22/2022   PLT 245 05/25/2022    '@LASTCHEM'$ @  RADIOGRAPHIC STUDIES: VAS [place] LOWER EXTREMITY VENOUS (DVT)  Result Date: 05/25/2022  Lower Venous DVT Study Patient Name:  MAKESHIA SEAT  Date of Exam:   05/25/2022 Medical Rec #: [id number]         Accession #:    [id number] Date of Birth: 04-May-1932         Patient Gender: F Patient Age:   33 years Exam Location:  [facility] Procedure:      VAS [place] LOWER EXTREMITY VENOUS (DVT) Referring Phys: Nyoka Lint DOUTOVA --------------------------------------------------------------------------------  Indications: Pulmonary embolism.  Anticoagulation: Heparin. Comparison Study: No prior studies. Performing Technologist: Darlin Coco RDMS, RVT  Examination Guidelines: A complete evaluation includes B-mode imaging, spectral Doppler, color Doppler, and power Doppler as needed of all accessible portions of each vessel. Bilateral testing is considered an integral part of a complete examination. Limited examinations for reoccurring indications may be performed as noted. The reflux portion of the exam is performed with  the patient in reverse Trendelenburg.  +---------+---------------+---------+-----------+----------+--------------+ RIGHT    CompressibilityPhasicitySpontaneityPropertiesThrombus Aging +---------+---------------+---------+-----------+----------+--------------+ CFV      Full           Yes      Yes                                 +---------+---------------+---------+-----------+----------+--------------+ SFJ      Full                                                        +---------+---------------+---------+-----------+----------+--------------+ FV Prox  Full                                                         +---------+---------------+---------+-----------+----------+--------------+ FV Mid   Full                                                        +---------+---------------+---------+-----------+----------+--------------+ FV DistalFull                                                        +---------+---------------+---------+-----------+----------+--------------+ PFV      Full                                                        +---------+---------------+---------+-----------+----------+--------------+ POP      Full           Yes      Yes                                 +---------+---------------+---------+-----------+----------+--------------+ PTV      Full                                                        +---------+---------------+---------+-----------+----------+--------------+ PERO     Full                                                        +---------+---------------+---------+-----------+----------+--------------+ Gastroc  Partial        Yes      Yes                  Acute          +---------+---------------+---------+-----------+----------+--------------+   +---------+---------------+---------+-----------+----------+--------------+  LEFT     CompressibilityPhasicitySpontaneityPropertiesThrombus Aging +---------+---------------+---------+-----------+----------+--------------+ CFV      Full           Yes      Yes                                 +---------+---------------+---------+-----------+----------+--------------+ SFJ      Full                                                        +---------+---------------+---------+-----------+----------+--------------+ FV Prox  Full                                                        +---------+---------------+---------+-----------+----------+--------------+ FV Mid   Full                                                         +---------+---------------+---------+-----------+----------+--------------+ FV DistalFull                                                        +---------+---------------+---------+-----------+----------+--------------+ PFV      Full                                                        +---------+---------------+---------+-----------+----------+--------------+ POP      Full           Yes      Yes                                 +---------+---------------+---------+-----------+----------+--------------+ PTV      Full                                                        +---------+---------------+---------+-----------+----------+--------------+ PERO     None           No       No                   Acute          +---------+---------------+---------+-----------+----------+--------------+ Gastroc  Full                                                        +---------+---------------+---------+-----------+----------+--------------+  Summary: RIGHT: - Findings consistent with acute deep vein thrombosis involving the right gastrocnemius veins. - No cystic structure found in the popliteal fossa.  LEFT: - Findings consistent with acute deep vein thrombosis involving the left peroneal veins. - No cystic structure found in the popliteal fossa.  *See table(s) above for measurements and observations. Electronically signed by Servando Snare MD on 05/25/2022 at 5:24:19 PM.    Final    ECHOCARDIOGRAM COMPLETE  Result Date: 05/25/2022    ECHOCARDIOGRAM REPORT   Patient Name:   Leandria Thier Date of Exam: 05/25/2022 Medical Rec #:  [id number]        Height:       63.0 in Accession #:    [id number]       Weight:       175.9 lb Date of Birth:  [birth year]        BSA:          1.831 m Patient Age:    67 years         BP:           103/69 mmHg Patient Gender: F                HR:           87 bpm. Exam Location:  Inpatient Procedure: 2D Echo, Cardiac Doppler and Color Doppler  Indications:    I26.02 Pulmonary embolus  History:        Patient has prior history of Echocardiogram examinations, most                 recent 12/06/2005. Signs/Symptoms:Shortness of Breath, Dyspnea,                 Chest Pain and Bacteremia; Risk Factors:Hypertension and Former                 Smoker.  Sonographer:    Roseanna Rainbow RDCS Referring Phys: Toy Baker  Sonographer Comments: Technically difficult study due to poor echo windows. IMPRESSIONS  1. Left ventricular ejection fraction, by estimation, is 65 to 70%. The left ventricle has normal function. The left ventricle has no regional wall motion abnormalities. There is mild left ventricular hypertrophy. Left ventricular diastolic parameters are consistent with Grade I diastolic dysfunction (impaired relaxation). There is the interventricular septum is flattened in systole, consistent with right ventricular pressure overload.  2. Right ventricular systolic function is moderately reduced. The right ventricular size is severely enlarged. There is moderately elevated pulmonary artery systolic pressure. The estimated right ventricular systolic pressure is 25.0 mmHg.  3. Tricuspid valve regurgitation is severe.  4. The mitral valve is grossly normal. Trivial mitral valve regurgitation. No evidence of mitral stenosis.  5. The aortic valve is grossly normal. There is mild calcification of the aortic valve. Aortic valve regurgitation is not visualized. No aortic stenosis is present.  6. A small pericardial effusion is present. The pericardial effusion is posterior to the left ventricle.  7. The inferior vena cava is normal in size with greater than 50% respiratory variability, suggesting right atrial pressure of 3 mmHg.  8. Right atrial size was mildly dilated. FINDINGS  Left Ventricle: Left ventricular ejection fraction, by estimation, is 65 to 70%. The left ventricle has normal function. The left ventricle has no regional wall motion abnormalities. The left  ventricular internal cavity size was normal in size. There is  mild left ventricular hypertrophy. The interventricular septum is flattened in systole, consistent with right ventricular pressure overload. Left  ventricular diastolic parameters are consistent with Grade I diastolic dysfunction (impaired relaxation). Right Ventricle: The right ventricular size is severely enlarged. No increase in right ventricular wall thickness. Right ventricular systolic function is moderately reduced. There is moderately elevated pulmonary artery systolic pressure. The tricuspid regurgitant velocity is 3.31 m/s, and with an assumed right atrial pressure of 15 mmHg, the estimated right ventricular systolic pressure is 78.2 mmHg. Left Atrium: Left atrial size was normal in size. Right Atrium: Right atrial size was mildly dilated. Pericardium: A small pericardial effusion is present. The pericardial effusion is posterior to the left ventricle. Presence of epicardial fat layer. Mitral Valve: The mitral valve is grossly normal. Trivial mitral valve regurgitation. No evidence of mitral valve stenosis. Tricuspid Valve: The tricuspid valve is normal in structure. Tricuspid valve regurgitation is severe. No evidence of tricuspid stenosis. Aortic Valve: The aortic valve is grossly normal. There is mild calcification of the aortic valve. Aortic valve regurgitation is not visualized. No aortic stenosis is present. Pulmonic Valve: The pulmonic valve was normal in structure. Pulmonic valve regurgitation is mild. No evidence of pulmonic stenosis. Aorta: The aortic root is normal in size and structure. Ascending aorta measurements are within normal limits for age when indexed to body surface area. Venous: The inferior vena cava is normal in size with greater than 50% respiratory variability, suggesting right atrial pressure of 3 mmHg. IAS/Shunts: No atrial level shunt detected by color flow Doppler.  LEFT VENTRICLE PLAX 2D LVIDd:         3.30 cm    Diastology LVIDs:         1.80 cm   LV e' medial:    2.94 cm/s LV PW:         1.00 cm   LV E/e' medial:  21.7 LV IVS:        1.10 cm   LV e' lateral:   5.35 cm/s LVOT diam:     2.20 cm   LV E/e' lateral: 11.9 LV SV:         66 LV SV Index:   36 LVOT Area:     3.80 cm  RIGHT VENTRICLE             IVC RV S prime:     12.10 cm/s  IVC diam: 2.25 cm RVOT diam:      2.30 cm TAPSE (M-mode): 1.3 cm LEFT ATRIUM             Index       RIGHT ATRIUM           Index LA diam:        2.70 cm 1.47 cm/m  RA Area:     18.20 cm LA Vol (A2C):   17.7 ml 9.67 ml/m  RA Volume:   52.80 ml  28.84 ml/m LA Vol (A4C):   17.9 ml 9.78 ml/m LA Biplane Vol: 17.9 ml 9.78 ml/m  AORTIC VALVE LVOT Vmax:   111.00 cm/s LVOT Vmean:  71.900 cm/s LVOT VTI:    0.173 m  AORTA Ao Root diam: 3.30 cm Ao Asc diam:  3.80 cm MITRAL VALVE                TRICUSPID VALVE MV Area (PHT): 3.99 cm     TR Peak grad:   43.8 mmHg MV Decel Time: 190 msec     TR Vmax:        331.00 cm/s MV E velocity: 63.90 cm/s MV A velocity: 111.00 cm/s  SHUNTS MV E/A ratio:  0.58         Systemic VTI:  0.17 m                             Systemic Diam: 2.20 cm                             Pulmonic Diam: 2.30 cm Cherlynn Kaiser MD Electronically signed by Cherlynn Kaiser MD Signature Date/Time: 05/25/2022/3:36:05 PM    Final    CT Angio Chest PE W and/or Wo Contrast  Addendum Date: 06/06/2022   ADDENDUM REPORT: 05/23/2022 23:49 ADDENDUM: These results were called by telephone at the time of interpretation on 06/04/2022 at 11:49 pm to provider Dr. Roel Cluck, who verbally acknowledged these results. Electronically Signed   By: Ronney Asters M.D.   On: 06/08/2022 23:49   Result Date: 05/18/2022 CLINICAL DATA:  Shortness of breath and chills.  Abdominal pain. EXAM: CT ANGIOGRAPHY CHEST CT ABDOMEN AND PELVIS WITH CONTRAST TECHNIQUE: Multidetector CT imaging of the chest was performed using the standard protocol during bolus administration of intravenous contrast. Multiplanar CT image  reconstructions and MIPs were obtained to evaluate the vascular anatomy. Multidetector CT imaging of the abdomen and pelvis was performed using the standard protocol during bolus administration of intravenous contrast. RADIATION DOSE REDUCTION: This exam was performed according to the departmental dose-optimization program which includes automated exposure control, adjustment of the mA and/or kV according to patient size and/or use of iterative reconstruction technique. CONTRAST:  55m OMNIPAQUE IOHEXOL 350 MG/ML SOLN COMPARISON:  CT chest 06/06/2016.  Lumbar spine x-ray 12/09/2011. FINDINGS: CTA CHEST FINDINGS Cardiovascular: Heart is mildly enlarged. Aorta is normal in size. There are atherosclerotic calcifications of the aorta. There is adequate opacification of the pulmonary arteries to the segmental level. There are lobar and segmental left upper lobe and right upper lobe pulmonary emboli. Mediastinum/Nodes: No enlarged mediastinal, hilar, or axillary lymph nodes. Thyroid gland, trachea, and esophagus demonstrate no significant findings. Lungs/Pleura: Fibrotic and interstitial opacities are again seen throughout both lungs, increased compared to 2018. There are new patchy ground-glass opacities in the bilateral upper lobes. There is no pleural effusion or pneumothorax. Trachea and central airways are patent. Musculoskeletal: No chest wall abnormality. No acute or significant osseous findings. Review of the MIP images confirms the above findings. CT ABDOMEN and PELVIS FINDINGS Hepatobiliary: No focal liver abnormality is seen. Status post cholecystectomy. No biliary dilatation. Pancreas: Unremarkable. No pancreatic ductal dilatation or surrounding inflammatory changes. Spleen: Normal in size without focal abnormality. Adrenals/Urinary Tract: There are subcentimeter cysts in both kidneys. Otherwise, the kidneys, adrenal glands and bladder are within normal limits. Stomach/Bowel: There is a small hiatal hernia.  Stomach is otherwise within normal limits. Appendix is not seen. No evidence of bowel wall thickening, distention, or inflammatory changes. There is sigmoid colon diverticulosis. Vascular/Lymphatic: Aortic atherosclerosis. No enlarged abdominal or pelvic lymph nodes. Reproductive: Status post hysterectomy. No adnexal masses. Other: No abdominal wall hernia or abnormality. No abdominopelvic ascites. Musculoskeletal: L1 compression deformity has mildly progressed compared to 2013. Review of the MIP images confirms the above findings. IMPRESSION: 1. Bilateral upper lobe lobar and segmental pulmonary emboli. Positive for acute PE with CTevidence of right heart strain (RV/LV Ratio = 3.2) consistent with at least submassive (intermediate risk) PE. The presence of right heart strain has been associated with an increased risk of morbidity and mortality. 2.  New patchy ground-glass opacities in the bilateral upper lobes, likely infectious/inflammatory. 3. Findings compatible with chronic interstitial lung disease, increased compared to 2018. 4. No acute localizing process in the abdomen or pelvis. 5. Sigmoid colon diverticulosis. Aortic Atherosclerosis (ICD10-I70.0). Electronically Signed: By: Ronney Asters M.D. On: 05/31/2022 23:40   CT ABDOMEN PELVIS W CONTRAST  Addendum Date: 05/23/2022   ADDENDUM REPORT: 06/15/2022 23:49 ADDENDUM: These results were called by telephone at the time of interpretation on 05/22/2022 at 11:49 pm to provider Dr. Roel Cluck, who verbally acknowledged these results. Electronically Signed   By: Ronney Asters M.D.   On: 06/09/2022 23:49   Result Date: 06/02/2022 CLINICAL DATA:  Shortness of breath and chills.  Abdominal pain. EXAM: CT ANGIOGRAPHY CHEST CT ABDOMEN AND PELVIS WITH CONTRAST TECHNIQUE: Multidetector CT imaging of the chest was performed using the standard protocol during bolus administration of intravenous contrast. Multiplanar CT image reconstructions and MIPs were obtained to evaluate  the vascular anatomy. Multidetector CT imaging of the abdomen and pelvis was performed using the standard protocol during bolus administration of intravenous contrast. RADIATION DOSE REDUCTION: This exam was performed according to the departmental dose-optimization program which includes automated exposure control, adjustment of the mA and/or kV according to patient size and/or use of iterative reconstruction technique. CONTRAST:  68m OMNIPAQUE IOHEXOL 350 MG/ML SOLN COMPARISON:  CT chest 06/06/2016.  Lumbar spine x-ray 12/09/2011. FINDINGS: CTA CHEST FINDINGS Cardiovascular: Heart is mildly enlarged. Aorta is normal in size. There are atherosclerotic calcifications of the aorta. There is adequate opacification of the pulmonary arteries to the segmental level. There are lobar and segmental left upper lobe and right upper lobe pulmonary emboli. Mediastinum/Nodes: No enlarged mediastinal, hilar, or axillary lymph nodes. Thyroid gland, trachea, and esophagus demonstrate no significant findings. Lungs/Pleura: Fibrotic and interstitial opacities are again seen throughout both lungs, increased compared to 2018. There are new patchy ground-glass opacities in the bilateral upper lobes. There is no pleural effusion or pneumothorax. Trachea and central airways are patent. Musculoskeletal: No chest wall abnormality. No acute or significant osseous findings. Review of the MIP images confirms the above findings. CT ABDOMEN and PELVIS FINDINGS Hepatobiliary: No focal liver abnormality is seen. Status post cholecystectomy. No biliary dilatation. Pancreas: Unremarkable. No pancreatic ductal dilatation or surrounding inflammatory changes. Spleen: Normal in size without focal abnormality. Adrenals/Urinary Tract: There are subcentimeter cysts in both kidneys. Otherwise, the kidneys, adrenal glands and bladder are within normal limits. Stomach/Bowel: There is a small hiatal hernia. Stomach is otherwise within normal limits. Appendix  is not seen. No evidence of bowel wall thickening, distention, or inflammatory changes. There is sigmoid colon diverticulosis. Vascular/Lymphatic: Aortic atherosclerosis. No enlarged abdominal or pelvic lymph nodes. Reproductive: Status post hysterectomy. No adnexal masses. Other: No abdominal wall hernia or abnormality. No abdominopelvic ascites. Musculoskeletal: L1 compression deformity has mildly progressed compared to 2013. Review of the MIP images confirms the above findings. IMPRESSION: 1. Bilateral upper lobe lobar and segmental pulmonary emboli. Positive for acute PE with CTevidence of right heart strain (RV/LV Ratio = 3.2) consistent with at least submassive (intermediate risk) PE. The presence of right heart strain has been associated with an increased risk of morbidity and mortality. 2. New patchy ground-glass opacities in the bilateral upper lobes, likely infectious/inflammatory. 3. Findings compatible with chronic interstitial lung disease, increased compared to 2018. 4. No acute localizing process in the abdomen or pelvis. 5. Sigmoid colon diverticulosis. Aortic Atherosclerosis (ICD10-I70.0). Electronically Signed: By: ARonney AstersM.D. On: 05/21/2022 23:40   DG Chest  Port 1 View  Result Date: 06/08/2022 CLINICAL DATA:  SOB EXAM: PORTABLE CHEST 1 VIEW COMPARISON:  02/24/2021 FINDINGS: Cardiac silhouette is enlarged. Pulmonary interstitial changes consistent with fibrosis similar to the previous examination. No pneumothorax. Calcified aorta. No definite pleural effusions. IMPRESSION: Pulmonary fibrosis.  Prominent cardiac silhouette. Electronically Signed   By: Sammie Bench M.D.   On: 06/05/2022 19:44    ASSESSMENT: This is a very pleasant 89 years old white female with multiple comorbidities including history of interstitial lung disease as well as cardiac arrhythmia and recently diagnosed bilateral pulmonary embolism as well as deep venous thrombosis.  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  The patient is currently on anticoagulation with IV heparin.  There is a risk of gastrointestinal bleeding from the sigmoid colon tumor.   PLAN: I had a lengthy discussion with the patient and her family today about her current condition and treatment options. The patient is not a candidate for surgical resection because of her comorbidities and inability to tolerate anesthesia and intubation for the surgery. I would recommend consulting radiation oncology for evaluation and to see if the patient would benefit from palliative radiotherapy to this lesion. For the history of deep venous thrombosis and bilateral pulmonary embolism, this is a very complicated issue send The patient has a risk of gastrointestinal bleeding from the sigmoid tumor.  I would recommend for her to continue her current treatment with IV heparin and close monitoring during her hospitalization.  We may need to consider The patient for IV filter placement before discharge to prevent further dislocation of the clots from the lower extremity to her lungs. She may benefit from low-dose anticoagulation even after the filter placement to prevent any further development of deep venous thrombosis and also to help with the resolution of the pulmonary embolism.  I would recommend for the patient to be on either a prophylactic dose of Lovenox at home and if not convenient she may need to be considered for low-dose Eliquis 2.5 mg p.o. twice daily and close monitoring by her primary care physician for 3-6 months and if she has any worsening bleeding to discontinue the medication.  There is no guideline or standard of care for her current condition but this is the best recommendation I can come up with.  The patient voices understanding of current disease status and treatment options and is in agreement with the current care plan.  All questions were answered. The patient knows to call  the clinic with any problems, questions or concerns. We can certainly see the patient much sooner if necessary.  Thank you so much for allowing me to participate in the care of [facility]. I will continue to follow up the patient with you and assist in her care.  The total time spent in the appointment was 60 minutes.  Disclaimer: This note was dictated with voice recognition software. Similar sounding words can inadvertently be transcribed and may not be corrected upon review.   Eilleen Kempf May 29, 2022, 3:06 PM

## 2022-05-30 ENCOUNTER — Inpatient Hospital Stay (HOSPITAL_COMMUNITY): Payer: Medicare Other

## 2022-05-30 ENCOUNTER — Other Ambulatory Visit: Payer: Self-pay | Admitting: Radiology

## 2022-05-30 DIAGNOSIS — J189 Pneumonia, unspecified organism: Secondary | ICD-10-CM | POA: Diagnosis not present

## 2022-05-30 DIAGNOSIS — K922 Gastrointestinal hemorrhage, unspecified: Secondary | ICD-10-CM | POA: Diagnosis not present

## 2022-05-30 DIAGNOSIS — R0603 Acute respiratory distress: Secondary | ICD-10-CM | POA: Diagnosis not present

## 2022-05-30 DIAGNOSIS — R6510 Systemic inflammatory response syndrome (SIRS) of non-infectious origin without acute organ dysfunction: Secondary | ICD-10-CM

## 2022-05-30 DIAGNOSIS — I2699 Other pulmonary embolism without acute cor pulmonale: Secondary | ICD-10-CM | POA: Diagnosis not present

## 2022-05-30 DIAGNOSIS — J8410 Pulmonary fibrosis, unspecified: Secondary | ICD-10-CM | POA: Diagnosis not present

## 2022-05-30 DIAGNOSIS — K6289 Other specified diseases of anus and rectum: Secondary | ICD-10-CM

## 2022-05-30 DIAGNOSIS — I82409 Acute embolism and thrombosis of unspecified deep veins of unspecified lower extremity: Secondary | ICD-10-CM | POA: Diagnosis not present

## 2022-05-30 DIAGNOSIS — R7989 Other specified abnormal findings of blood chemistry: Secondary | ICD-10-CM | POA: Diagnosis not present

## 2022-05-30 HISTORY — PX: IR IVC FILTER PLMT / S&I /IMG GUID/MOD SED: IMG701

## 2022-05-30 LAB — CBC
HCT: 45.5 % (ref 36.0–46.0)
Hemoglobin: 14.3 g/dL (ref 12.0–15.0)
MCH: 28.5 pg (ref 26.0–34.0)
MCHC: 31.4 g/dL (ref 30.0–36.0)
MCV: 90.8 fL (ref 80.0–100.0)
Platelets: 216 10*3/uL (ref 150–400)
RBC: 5.01 MIL/uL (ref 3.87–5.11)
RDW: 14.2 % (ref 11.5–15.5)
WBC: 19.9 10*3/uL — ABNORMAL HIGH (ref 4.0–10.5)
nRBC: 0.4 % — ABNORMAL HIGH (ref 0.0–0.2)

## 2022-05-30 LAB — BRAIN NATRIURETIC PEPTIDE: B Natriuretic Peptide: 1224.2 pg/mL — ABNORMAL HIGH (ref 0.0–100.0)

## 2022-05-30 LAB — PROCALCITONIN: Procalcitonin: 0.2 ng/mL

## 2022-05-30 LAB — LACTIC ACID, PLASMA: Lactic Acid, Venous: 1.3 mmol/L (ref 0.5–1.9)

## 2022-05-30 MED ORDER — SODIUM CHLORIDE 0.9 % IV SOLN
2.0000 g | Freq: Once | INTRAVENOUS | Status: AC
  Administered 2022-05-30: 2 g via INTRAVENOUS
  Filled 2022-05-30: qty 12.5

## 2022-05-30 MED ORDER — IOHEXOL 300 MG/ML  SOLN
100.0000 mL | Freq: Once | INTRAMUSCULAR | Status: AC | PRN
  Administered 2022-05-30: 40 mL via INTRAVENOUS

## 2022-05-30 MED ORDER — FUROSEMIDE 10 MG/ML IJ SOLN
40.0000 mg | Freq: Once | INTRAMUSCULAR | Status: AC
  Administered 2022-05-30: 40 mg via INTRAVENOUS
  Filled 2022-05-30: qty 4

## 2022-05-30 MED ORDER — VANCOMYCIN HCL 1500 MG/300ML IV SOLN
1500.0000 mg | Freq: Once | INTRAVENOUS | Status: AC
  Filled 2022-05-30: qty 300

## 2022-05-30 MED ORDER — SODIUM CHLORIDE 0.9 % IV SOLN: 2.0000 g | Freq: Two times a day (BID) | INTRAVENOUS | Status: DC

## 2022-05-30 MED ORDER — MELATONIN 3 MG PO TABS
3.0000 mg | ORAL_TABLET | Freq: Every evening | ORAL | Status: DC | PRN
  Administered 2022-06-02 – 2022-06-03 (×2): 3 mg via ORAL
  Filled 2022-05-30 (×2): qty 1

## 2022-05-30 MED ORDER — LIDOCAINE HCL 1 % IJ SOLN
INTRAMUSCULAR | Status: AC
  Administered 2022-05-30: 10 mL
  Filled 2022-05-30: qty 20

## 2022-05-30 MED ORDER — VANCOMYCIN HCL 1500 MG/300ML IV SOLN
1500.0000 mg | Freq: Once | INTRAVENOUS | Status: AC
  Administered 2022-05-30: 1500 mg via INTRAVENOUS
  Filled 2022-05-30: qty 300

## 2022-05-30 MED ORDER — ALPRAZOLAM 0.5 MG PO TABS
0.2500 mg | ORAL_TABLET | Freq: Three times a day (TID) | ORAL | Status: DC | PRN
  Administered 2022-05-30 – 2022-06-09 (×9): 0.25 mg via ORAL
  Filled 2022-05-30 (×9): qty 1

## 2022-05-30 MED ORDER — VANCOMYCIN HCL IN DEXTROSE 1-5 GM/200ML-% IV SOLN: 1000.0000 mg | Freq: Once | INTRAVENOUS | Status: DC

## 2022-05-30 MED ORDER — METRONIDAZOLE 500 MG/100ML IV SOLN
500.0000 mg | Freq: Two times a day (BID) | INTRAVENOUS | Status: DC
  Administered 2022-05-30 – 2022-06-02 (×6): 500 mg via INTRAVENOUS
  Filled 2022-05-30 (×6): qty 100

## 2022-05-30 MED ORDER — VANCOMYCIN HCL IN DEXTROSE 1-5 GM/200ML-% IV SOLN
1000.0000 mg | INTRAVENOUS | Status: DC
  Administered 2022-06-01: 1000 mg via INTRAVENOUS
  Filled 2022-05-30: qty 200

## 2022-05-30 NOTE — Progress Notes (Signed)
Patient was seen for new fever, increased WOB, increased RR, and increased supplemental O2 requirement.   Brief HPI: 89 yr old lady with hx of pulmonary fibrosis, chronic 4 L/min supplemental oxygen requirement, RA, history of DVT, PUD, and AAA who was admitted 6 days ago with acute on chronic hypoxic respiratory failure due to PE.  Treatment has been complicated by acute lower GI bleeding suspected secondary to rectal cancer.  She had an IVC filter placed today.  She was noted to be hypoxic with saturation in the 60s on 10 L/min of supplemental oxygen tonight.  This was accompanied by labored respirations, tachypnea, and a new fever to 102.7 F.   Subjective: She does not feel short of breath and denies chest pain or abdominal pain.   She would not want to be intubated and would not want CPR, but desires ongoing medical treatment as we are doing. Her daughter is at the bedside and supportive of this decision; they had been discussing this earlier today.   Objective: Temp 102.7 F, HR low 100s, RR low 30s, SBP 130s, O2 sat 90% on 15 Lpm.   She is awake, there is no pallor or diaphoresis, she has labored respirations, rapid and regular HR, soft and non-tender abdomen.   A&P: New fever and worsened respiratory status.   Plan for CXR, trial of Lasix, blood cultures, check lactate and procalcitonin, start broad-spectrum antibiotics for now, update code status.

## 2022-05-30 NOTE — Significant Event (Addendum)
Rapid Response Event Note   Reason for Call :  Tachycardia, tachypnea, hypoxia(SpO2-85% on 10L HFNC)  Initial Focused Assessment:  Pt lying in bed with eyes closed. Her breathing is tachypneic. She is shivering. She is c/o being cold. Lungs clear in BUL, crackles in B bases. ABD large, soft. Skin hot to touch.  T-102.6(R), HR-108, BP-141/77, RR-28, SpO2-98% on NRB.  Attempt made to placed pt on 15L HFNC-SpO2 dropped to 60%.  Pt placed back on NRB-100%. Pt later weaned to 55% VM-90-91%  Interventions:  NRB, VM Tylenol(prn order) PCXR- Persistent vague upper lobe airspace opacities with underlying pulmonary fibrosis.  '40mg'$  lasix IV BNP, Pct, LA BC x 2 Maxipime, Flagy, Vanc DNR Plan of Care:  Allow time for lasix to work. Await lab results. Give Abx AFTER blood cultures done. Pt is now a DNR. May wean oxygen as able. Continue to monitor pt. Call RRT if further assistance needed.   Event Summary:   MD Notified: Dr. Myna Hidalgo notified and came to bedside.  Call [place]  Dillard Essex, RN

## 2022-05-30 NOTE — Consult Note (Signed)
Chief Complaint: Bilateral PE/ DVT. Unable to tolerate anticoagulation  Referring Physician(s): Dr. Sandi Mariscal  Supervising Physician: Ruthann Cancer  Patient Status: [facility] - In-pt  History of Present Illness: Veronica Stone is a 89 y.o. female inpatient. History of interstitial lung disease, HTN, HLD, RA. Found to have a segmental pulmonary emboli with right heart stain.  CT Angio chest reads  Bilateral upper lobe lobar and segmental pulmonary emboli. Positive for acute PE with CTevidence of right heart strain (RV/LV Ratio = 3.2) consistent with at least submassive (intermediate risk) PE. The presence of right heart strain has been associated with an increased risk of morbidity and mortality Patient was started on heparin gtt however she developed hematochezia. Subsequent EGD found a mass int he signed colon thought to be malignant. Doppler from 1.10.24 shows DVT of right gastrocnemius vein and left perineal veins.  Daughter and son in law at bedside. Patient alert and laying in bed,calm. Endorses SHOB. Denies any fevers, headache, chest pain, cough, abdominal pain, nausea, vomiting or bleeding. Return precautions and treatment recommendations and follow-up discussed with the patient and her family members who are all agreeable with the plan.    Past Medical History:  Diagnosis Date   Colon, diverticulosis 03/16/2006   Hyperlipemia    Hypertension    IBS (irritable bowel syndrome)    Irregular heart rate    Personal history of colonic polyps 04/10/2006   hyperplastic   Pulmonary fibrosis (HCC)    Rheumatoid arthritis(714.0)     Past Surgical History:  Procedure Laterality Date   ABDOMINAL HYSTERECTOMY     APPENDECTOMY     CHOLECYSTECTOMY     CYSTECTOMY     left breast    Allergies: Dilaudid [hydromorphone hcl], Penicillins, and Statins  Medications: Prior to Admission medications   Medication Sig Start Date End Date Taking? Authorizing Provider  cholecalciferol  (VITAMIN D) 1000 units tablet Take 1,000 Units by mouth daily.   Yes [provider]  furosemide (LASIX) 20 MG tablet Take 1 tablet by mouth daily as needed for edema. 10/02/10  Yes [provider]  HYDROcodone-acetaminophen (NORCO/VICODIN) 5-325 MG tablet Take 1 tablet by mouth in the morning, at noon, and at bedtime.   Yes [provider]  irbesartan (AVAPRO) 300 MG tablet Take 300 mg by mouth daily.   Yes [provider]  loratadine (CLARITIN) 10 MG tablet Take 10 mg by mouth daily as needed for allergies.   Yes [provider]  Polyethyl Glycol-Propyl Glycol 0.4-0.3 % SOLN Place 1 drop into both eyes every other day. For dry eyes   Yes [provider]  predniSONE (DELTASONE) 10 MG tablet TAKE 1 TABLET(10 MG) BY MOUTH DAILY WITH BREAKFAST Patient taking differently: Take 10 mg by mouth daily with breakfast. 05/11/21  Yes Rigoberto Noel, MD  RiTUXimab (RITUXAN IV) Inject 2 application into the vein once. TAKES 2 DOSES IN ONE MONTH TIME, THEN DOESN'T TAKEN AGAIN FOR 6 MONTHS   Yes [provider]     Family History  Problem Relation Age of Onset   Heart disease Brother    Heart disease Sister    Liver disease Daughter    Diabetes Brother    Rheum arthritis Mother     Social History   Socioeconomic History   Marital status: Divorced    Spouse name: Not on file   Number of children: 5   Years of education: Not on file   Highest education level: Not on file  Occupational History   Occupation: Retired  Tobacco Use   Smoking status: Former    Packs/day: 1.00    Years: 30.00    Total pack years: 30.00    Types: Cigarettes    Quit date: 05/16/1988    Years since quitting: 34.0   Smokeless tobacco: Never  Vaping Use   Vaping Use: Never used  Substance and Sexual Activity   Alcohol use: No   Drug use: No   Sexual activity: Not on file  Other Topics Concern   Not on file  Social History Narrative   Daily Caffeine Use 2  cups   Pt does not get regular exercise   Social Determinants of Health   Financial Resource Strain: Not on file  Food Insecurity: Not on file  Transportation Needs: Not on file  Physical Activity: Not on file  Stress: Not on file  Social Connections: Not on file    Review of Systems: A 12 point ROS discussed and pertinent positives are indicated in the HPI above.  All other systems are negative.  Review of Systems  Constitutional:  Negative for fatigue and fever.  HENT:  Negative for congestion.   Respiratory:  Positive for shortness of breath. Negative for cough.   Gastrointestinal:  Negative for abdominal pain, diarrhea, nausea and vomiting.    Vital Signs: BP (!) 140/86 (BP Location: Left Arm)   Pulse 100   Temp 98.5 F (36.9 C) (Oral)   Resp 20   Ht '5\' 3"'$  (1.6 m)   Wt 175 lb 14.8 oz (79.8 kg)   SpO2 96%   BMI 31.16 kg/m     Physical Exam Vitals and nursing note reviewed.  Constitutional:      Appearance: She is well-developed.  HENT:     Head: Normocephalic and atraumatic.  Eyes:     Conjunctiva/sclera: Conjunctivae normal.  Cardiovascular:     Rate and Rhythm: Normal rate and regular rhythm.  Pulmonary:     Effort: Pulmonary effort is normal.     Breath sounds: Normal breath sounds.     Comments: On O2 via [place] Musculoskeletal:        General: Normal range of motion.     Cervical back: Normal range of motion.  Skin:    General: Skin is warm.  Neurological:     Mental Status: She is alert and oriented to person, place, and time.     Imaging: VAS [place] LOWER EXTREMITY VENOUS (DVT)  Result Date: 05/25/2022  Lower Venous DVT Study Patient Name:  Veronica Stone  Date of Exam:   05/25/2022 Medical Rec #: [id number]         Accession #:    [id number] Date of Birth: April 09, 1932         Patient Gender: F Patient Age:   13 years Exam Location:  [facility] Procedure:      VAS [place] LOWER EXTREMITY VENOUS (DVT) Referring Phys: Nyoka Lint DOUTOVA  --------------------------------------------------------------------------------  Indications: Pulmonary embolism.  Anticoagulation: Heparin. Comparison Study: No prior studies. Performing Technologist: Darlin Coco RDMS, RVT  Examination Guidelines: A complete evaluation includes B-mode imaging, spectral Doppler, color Doppler, and power Doppler as needed of all accessible portions of each vessel. Bilateral testing is considered an integral part of a complete examination. Limited examinations for reoccurring indications may be performed as noted. The reflux portion of the exam is performed with the patient in reverse Trendelenburg.  +---------+---------------+---------+-----------+----------+--------------+ RIGHT    CompressibilityPhasicitySpontaneityPropertiesThrombus Aging +---------+---------------+---------+-----------+----------+--------------+ CFV  Full           Yes      Yes                                 +---------+---------------+---------+-----------+----------+--------------+ SFJ      Full                                                        +---------+---------------+---------+-----------+----------+--------------+ FV Prox  Full                                                        +---------+---------------+---------+-----------+----------+--------------+ FV Mid   Full                                                        +---------+---------------+---------+-----------+----------+--------------+ FV DistalFull                                                        +---------+---------------+---------+-----------+----------+--------------+ PFV      Full                                                        +---------+---------------+---------+-----------+----------+--------------+ POP      Full           Yes      Yes                                 +---------+---------------+---------+-----------+----------+--------------+ PTV      Full                                                         +---------+---------------+---------+-----------+----------+--------------+ PERO     Full                                                        +---------+---------------+---------+-----------+----------+--------------+ Gastroc  Partial        Yes      Yes                  Acute          +---------+---------------+---------+-----------+----------+--------------+   +---------+---------------+---------+-----------+----------+--------------+ LEFT     CompressibilityPhasicitySpontaneityPropertiesThrombus Aging +---------+---------------+---------+-----------+----------+--------------+ CFV  Full           Yes      Yes                                 +---------+---------------+---------+-----------+----------+--------------+ SFJ      Full                                                        +---------+---------------+---------+-----------+----------+--------------+ FV Prox  Full                                                        +---------+---------------+---------+-----------+----------+--------------+ FV Mid   Full                                                        +---------+---------------+---------+-----------+----------+--------------+ FV DistalFull                                                        +---------+---------------+---------+-----------+----------+--------------+ PFV      Full                                                        +---------+---------------+---------+-----------+----------+--------------+ POP      Full           Yes      Yes                                 +---------+---------------+---------+-----------+----------+--------------+ PTV      Full                                                        +---------+---------------+---------+-----------+----------+--------------+ PERO     None           No       No                    Acute          +---------+---------------+---------+-----------+----------+--------------+ Gastroc  Full                                                        +---------+---------------+---------+-----------+----------+--------------+     Summary: RIGHT: - Findings consistent with acute  deep vein thrombosis involving the right gastrocnemius veins. - No cystic structure found in the popliteal fossa.  LEFT: - Findings consistent with acute deep vein thrombosis involving the left peroneal veins. - No cystic structure found in the popliteal fossa.  *See table(s) above for measurements and observations. Electronically signed by Servando Snare MD on 05/25/2022 at 5:24:19 PM.    Final    ECHOCARDIOGRAM COMPLETE  Result Date: 05/25/2022    ECHOCARDIOGRAM REPORT   Patient Name:   Veronica Stone Date of Exam: 05/25/2022 Medical Rec #:  [id number]        Height:       63.0 in Accession #:    [id number]       Weight:       175.9 lb Date of Birth:  1931/10/14        BSA:          1.831 m Patient Age:    53 years         BP:           103/69 mmHg Patient Gender: F                HR:           87 bpm. Exam Location:  Inpatient Procedure: 2D Echo, Cardiac Doppler and Color Doppler Indications:    I26.02 Pulmonary embolus  History:        Patient has prior history of Echocardiogram examinations, most                 recent 12/06/2005. Signs/Symptoms:Shortness of Breath, Dyspnea,                 Chest Pain and Bacteremia; Risk Factors:Hypertension and Former                 Smoker.  Sonographer:    Roseanna Rainbow RDCS Referring Phys: Toy Baker  Sonographer Comments: Technically difficult study due to poor echo windows. IMPRESSIONS  1. Left ventricular ejection fraction, by estimation, is 65 to 70%. The left ventricle has normal function. The left ventricle has no regional wall motion abnormalities. There is mild left ventricular hypertrophy. Left ventricular diastolic parameters are consistent with Grade I diastolic  dysfunction (impaired relaxation). There is the interventricular septum is flattened in systole, consistent with right ventricular pressure overload.  2. Right ventricular systolic function is moderately reduced. The right ventricular size is severely enlarged. There is moderately elevated pulmonary artery systolic pressure. The estimated right ventricular systolic pressure is 99.2 mmHg.  3. Tricuspid valve regurgitation is severe.  4. The mitral valve is grossly normal. Trivial mitral valve regurgitation. No evidence of mitral stenosis.  5. The aortic valve is grossly normal. There is mild calcification of the aortic valve. Aortic valve regurgitation is not visualized. No aortic stenosis is present.  6. A small pericardial effusion is present. The pericardial effusion is posterior to the left ventricle.  7. The inferior vena cava is normal in size with greater than 50% respiratory variability, suggesting right atrial pressure of 3 mmHg.  8. Right atrial size was mildly dilated. FINDINGS  Left Ventricle: Left ventricular ejection fraction, by estimation, is 65 to 70%. The left ventricle has normal function. The left ventricle has no regional wall motion abnormalities. The left ventricular internal cavity size was normal in size. There is  mild left ventricular hypertrophy. The interventricular septum is flattened in systole, consistent with right ventricular pressure overload. Left ventricular diastolic parameters are consistent with Grade I  diastolic dysfunction (impaired relaxation). Right Ventricle: The right ventricular size is severely enlarged. No increase in right ventricular wall thickness. Right ventricular systolic function is moderately reduced. There is moderately elevated pulmonary artery systolic pressure. The tricuspid regurgitant velocity is 3.31 m/s, and with an assumed right atrial pressure of 15 mmHg, the estimated right ventricular systolic pressure is 67.6 mmHg. Left Atrium: Left atrial size was  normal in size. Right Atrium: Right atrial size was mildly dilated. Pericardium: A small pericardial effusion is present. The pericardial effusion is posterior to the left ventricle. Presence of epicardial fat layer. Mitral Valve: The mitral valve is grossly normal. Trivial mitral valve regurgitation. No evidence of mitral valve stenosis. Tricuspid Valve: The tricuspid valve is normal in structure. Tricuspid valve regurgitation is severe. No evidence of tricuspid stenosis. Aortic Valve: The aortic valve is grossly normal. There is mild calcification of the aortic valve. Aortic valve regurgitation is not visualized. No aortic stenosis is present. Pulmonic Valve: The pulmonic valve was normal in structure. Pulmonic valve regurgitation is mild. No evidence of pulmonic stenosis. Aorta: The aortic root is normal in size and structure. Ascending aorta measurements are within normal limits for age when indexed to body surface area. Venous: The inferior vena cava is normal in size with greater than 50% respiratory variability, suggesting right atrial pressure of 3 mmHg. IAS/Shunts: No atrial level shunt detected by color flow Doppler.  LEFT VENTRICLE PLAX 2D LVIDd:         3.30 cm   Diastology LVIDs:         1.80 cm   LV e' medial:    2.94 cm/s LV PW:         1.00 cm   LV E/e' medial:  21.7 LV IVS:        1.10 cm   LV e' lateral:   5.35 cm/s LVOT diam:     2.20 cm   LV E/e' lateral: 11.9 LV SV:         66 LV SV Index:   36 LVOT Area:     3.80 cm  RIGHT VENTRICLE             IVC RV S prime:     12.10 cm/s  IVC diam: 2.25 cm RVOT diam:      2.30 cm TAPSE (M-mode): 1.3 cm LEFT ATRIUM             Index       RIGHT ATRIUM           Index LA diam:        2.70 cm 1.47 cm/m  RA Area:     18.20 cm LA Vol (A2C):   17.7 ml 9.67 ml/m  RA Volume:   52.80 ml  28.84 ml/m LA Vol (A4C):   17.9 ml 9.78 ml/m LA Biplane Vol: 17.9 ml 9.78 ml/m  AORTIC VALVE LVOT Vmax:   111.00 cm/s LVOT Vmean:  71.900 cm/s LVOT VTI:    0.173 m  AORTA Ao  Root diam: 3.30 cm Ao Asc diam:  3.80 cm MITRAL VALVE                TRICUSPID VALVE MV Area (PHT): 3.99 cm     TR Peak grad:   43.8 mmHg MV Decel Time: 190 msec     TR Vmax:        331.00 cm/s MV E velocity: 63.90 cm/s MV A velocity: 111.00 cm/s  SHUNTS MV E/A ratio:  0.58  Systemic VTI:  0.17 m                             Systemic Diam: 2.20 cm                             Pulmonic Diam: 2.30 cm Cherlynn Kaiser MD Electronically signed by Cherlynn Kaiser MD Signature Date/Time: 05/25/2022/3:36:05 PM    Final    CT Angio Chest PE W and/or Wo Contrast  Addendum Date: 05/28/2022   ADDENDUM REPORT: 06/04/2022 23:49 ADDENDUM: These results were called by telephone at the time of interpretation on 06/05/2022 at 11:49 pm to provider Dr. Roel Cluck, who verbally acknowledged these results. Electronically Signed   By: Ronney Asters M.D.   On: 05/23/2022 23:49   Result Date: 05/22/2022 CLINICAL DATA:  Shortness of breath and chills.  Abdominal pain. EXAM: CT ANGIOGRAPHY CHEST CT ABDOMEN AND PELVIS WITH CONTRAST TECHNIQUE: Multidetector CT imaging of the chest was performed using the standard protocol during bolus administration of intravenous contrast. Multiplanar CT image reconstructions and MIPs were obtained to evaluate the vascular anatomy. Multidetector CT imaging of the abdomen and pelvis was performed using the standard protocol during bolus administration of intravenous contrast. RADIATION DOSE REDUCTION: This exam was performed according to the departmental dose-optimization program which includes automated exposure control, adjustment of the mA and/or kV according to patient size and/or use of iterative reconstruction technique. CONTRAST:  35m OMNIPAQUE IOHEXOL 350 MG/ML SOLN COMPARISON:  CT chest 06/06/2016.  Lumbar spine x-ray 12/09/2011. FINDINGS: CTA CHEST FINDINGS Cardiovascular: Heart is mildly enlarged. Aorta is normal in size. There are atherosclerotic calcifications of the aorta. There is adequate  opacification of the pulmonary arteries to the segmental level. There are lobar and segmental left upper lobe and right upper lobe pulmonary emboli. Mediastinum/Nodes: No enlarged mediastinal, hilar, or axillary lymph nodes. Thyroid gland, trachea, and esophagus demonstrate no significant findings. Lungs/Pleura: Fibrotic and interstitial opacities are again seen throughout both lungs, increased compared to 2018. There are new patchy ground-glass opacities in the bilateral upper lobes. There is no pleural effusion or pneumothorax. Trachea and central airways are patent. Musculoskeletal: No chest wall abnormality. No acute or significant osseous findings. Review of the MIP images confirms the above findings. CT ABDOMEN and PELVIS FINDINGS Hepatobiliary: No focal liver abnormality is seen. Status post cholecystectomy. No biliary dilatation. Pancreas: Unremarkable. No pancreatic ductal dilatation or surrounding inflammatory changes. Spleen: Normal in size without focal abnormality. Adrenals/Urinary Tract: There are subcentimeter cysts in both kidneys. Otherwise, the kidneys, adrenal glands and bladder are within normal limits. Stomach/Bowel: There is a small hiatal hernia. Stomach is otherwise within normal limits. Appendix is not seen. No evidence of bowel wall thickening, distention, or inflammatory changes. There is sigmoid colon diverticulosis. Vascular/Lymphatic: Aortic atherosclerosis. No enlarged abdominal or pelvic lymph nodes. Reproductive: Status post hysterectomy. No adnexal masses. Other: No abdominal wall hernia or abnormality. No abdominopelvic ascites. Musculoskeletal: L1 compression deformity has mildly progressed compared to 2013. Review of the MIP images confirms the above findings. IMPRESSION: 1. Bilateral upper lobe lobar and segmental pulmonary emboli. Positive for acute PE with CTevidence of right heart strain (RV/LV Ratio = 3.2) consistent with at least submassive (intermediate risk) PE. The  presence of right heart strain has been associated with an increased risk of morbidity and mortality. 2. New patchy ground-glass opacities in the bilateral upper lobes, likely infectious/inflammatory. 3. Findings compatible  with chronic interstitial lung disease, increased compared to 2018. 4. No acute localizing process in the abdomen or pelvis. 5. Sigmoid colon diverticulosis. Aortic Atherosclerosis (ICD10-I70.0). Electronically Signed: By: Ronney Asters M.D. On: 05/18/2022 23:40   CT ABDOMEN PELVIS W CONTRAST  Addendum Date: 06/10/2022   ADDENDUM REPORT: 06/08/2022 23:49 ADDENDUM: These results were called by telephone at the time of interpretation on 06/02/2022 at 11:49 pm to provider Dr. Roel Cluck, who verbally acknowledged these results. Electronically Signed   By: Ronney Asters M.D.   On: 05/27/2022 23:49   Result Date: 05/20/2022 CLINICAL DATA:  Shortness of breath and chills.  Abdominal pain. EXAM: CT ANGIOGRAPHY CHEST CT ABDOMEN AND PELVIS WITH CONTRAST TECHNIQUE: Multidetector CT imaging of the chest was performed using the standard protocol during bolus administration of intravenous contrast. Multiplanar CT image reconstructions and MIPs were obtained to evaluate the vascular anatomy. Multidetector CT imaging of the abdomen and pelvis was performed using the standard protocol during bolus administration of intravenous contrast. RADIATION DOSE REDUCTION: This exam was performed according to the departmental dose-optimization program which includes automated exposure control, adjustment of the mA and/or kV according to patient size and/or use of iterative reconstruction technique. CONTRAST:  82m OMNIPAQUE IOHEXOL 350 MG/ML SOLN COMPARISON:  CT chest 06/06/2016.  Lumbar spine x-ray 12/09/2011. FINDINGS: CTA CHEST FINDINGS Cardiovascular: Heart is mildly enlarged. Aorta is normal in size. There are atherosclerotic calcifications of the aorta. There is adequate opacification of the pulmonary arteries to the  segmental level. There are lobar and segmental left upper lobe and right upper lobe pulmonary emboli. Mediastinum/Nodes: No enlarged mediastinal, hilar, or axillary lymph nodes. Thyroid gland, trachea, and esophagus demonstrate no significant findings. Lungs/Pleura: Fibrotic and interstitial opacities are again seen throughout both lungs, increased compared to 2018. There are new patchy ground-glass opacities in the bilateral upper lobes. There is no pleural effusion or pneumothorax. Trachea and central airways are patent. Musculoskeletal: No chest wall abnormality. No acute or significant osseous findings. Review of the MIP images confirms the above findings. CT ABDOMEN and PELVIS FINDINGS Hepatobiliary: No focal liver abnormality is seen. Status post cholecystectomy. No biliary dilatation. Pancreas: Unremarkable. No pancreatic ductal dilatation or surrounding inflammatory changes. Spleen: Normal in size without focal abnormality. Adrenals/Urinary Tract: There are subcentimeter cysts in both kidneys. Otherwise, the kidneys, adrenal glands and bladder are within normal limits. Stomach/Bowel: There is a small hiatal hernia. Stomach is otherwise within normal limits. Appendix is not seen. No evidence of bowel wall thickening, distention, or inflammatory changes. There is sigmoid colon diverticulosis. Vascular/Lymphatic: Aortic atherosclerosis. No enlarged abdominal or pelvic lymph nodes. Reproductive: Status post hysterectomy. No adnexal masses. Other: No abdominal wall hernia or abnormality. No abdominopelvic ascites. Musculoskeletal: L1 compression deformity has mildly progressed compared to 2013. Review of the MIP images confirms the above findings. IMPRESSION: 1. Bilateral upper lobe lobar and segmental pulmonary emboli. Positive for acute PE with CTevidence of right heart strain (RV/LV Ratio = 3.2) consistent with at least submassive (intermediate risk) PE. The presence of right heart strain has been associated  with an increased risk of morbidity and mortality. 2. New patchy ground-glass opacities in the bilateral upper lobes, likely infectious/inflammatory. 3. Findings compatible with chronic interstitial lung disease, increased compared to 2018. 4. No acute localizing process in the abdomen or pelvis. 5. Sigmoid colon diverticulosis. Aortic Atherosclerosis (ICD10-I70.0). Electronically Signed: By: ARonney AstersM.D. On: 05/23/2022 23:40   DG Chest Port 1 View  Result Date: 06/10/2022 CLINICAL DATA:  SOB EXAM: PORTABLE CHEST  1 VIEW COMPARISON:  02/24/2021 FINDINGS: Cardiac silhouette is enlarged. Pulmonary interstitial changes consistent with fibrosis similar to the previous examination. No pneumothorax. Calcified aorta. No definite pleural effusions. IMPRESSION: Pulmonary fibrosis.  Prominent cardiac silhouette. Electronically Signed   By: Sammie Bench M.D.   On: 06/10/2022 19:44    Labs:  CBC: Recent Labs    05/27/22 1033 05/28/22 0046 06/06/2022 0110 05/30/22 0058  WBC 14.3* 12.8* 12.8* 19.9*  HGB 12.9 12.6 12.5 14.3  HCT 39.7 37.7 37.6 45.5  PLT 282 268 245 216    COAGS: Recent Labs    05/25/22 0009  INR 1.3*  APTT 29    BMP: Recent Labs    06/13/2022 1936 06/09/2022 1951 05/25/22 1508 05/27/22 1033 06/13/2022 0110  NA 138 136 140 142 143  K 3.9 4.2 3.4* 3.3* 4.5  CL 97*  --  106 108 114*  CO2 23  --  '24 24 22  '$ GLUCOSE 165*  --  122* 104* 134*  BUN 17  --  '16 16 12  '$ CALCIUM 9.1  --  8.0* 8.2* 7.9*  CREATININE 1.43*  --  1.11* 1.04* 0.93  GFRNONAA 35*  --  48* 52* 59*    LIVER FUNCTION TESTS: Recent Labs    04/06/22 0904 04/20/22 0906 06/09/2022 1936 05/25/22 1508  BILITOT 0.9 1.3* 1.0 0.9  AST 33 32 60* 39  ALT '14 11 27 23  '$ ALKPHOS 56 55 66 60  PROT 6.3* 6.0* 5.9* 5.4*  ALBUMIN 3.9 3.6 3.4* 2.9*    Assessment and Plan:  89 y.o. female inpatient. History of interstitial lung disease, HTN, HLD, RA. Found to have a segmental pulmonary emboli with right heart  stain.  CT Angio chest reads  Bilateral upper lobe lobar and segmental pulmonary emboli. Positive for acute PE with CT evidence of right heart strain (RV/LV Ratio = 3.2) consistent with at least submassive (intermediate risk) PE. The presence of right heart strain has been associated with an increased risk of morbidity and mortality Patient was started on heparin gtt however she developed hematochezia. Subsequent EGD found a mass int he signed colon thought to be malignant. Doppler from 1.10.24 shows DVT of right gastrocnemius vein and left perineal veins. Team is requesting IVC filter placement  WBC 19.9. All other labs and medications are within acceptable parameters. Allergies include PCN, dilaudid. Patient has been NPO since midnight.  Risks and benefits discussed with the patient  and her family including, but not limited to bleeding, infection, contrast induced renal failure, filter fracture or migration which can lead to emergency surgery or even death, strut penetration with damage or irritation to adjacent structures an  All of the patient's questions were answered, patient and her family are agreeable to proceed.  Consent signed and in chart.  Thank you for this interesting consult.  I greatly enjoyed meeting Veronica Stone and look forward to participating in their care.  A copy of this report was sent to the requesting provider on this date.  Electronically Signed: Jacqualine Mau, NP 05/30/2022, 8:54 AM   I spent a total of 40 Minutes    in face to face in clinical consultation, greater than 50% of which was counseling/coordinating care for IVC filter placement

## 2022-05-30 NOTE — Progress Notes (Signed)
Pharmacy Antibiotic Note  Veronica Stone is a 89 y.o. female admitted with GIB. Pt now with fever and leukocytosis - to begin antibiotics for sepsis.  Pharmacy has been consulted for Vancomycin and Cefepime dosing.  Plan: Cefepime 2gm IV q12h x7 days Vancomycin 1500 mg IV now then  1000 mg IV Q 36 hrs x 7 days. Goal AUC 400-550. Expected AUC: 504, SCr used: 0.93. VD coeff 0.5 Will f/u renal function, micro data, and pt's clinical condition Vanc levels prn   Height: '5\' 3"'$  (160 cm) Weight: 79.8 kg (175 lb 14.8 oz) IBW/kg (Calculated) : 52.4  Temp (24hrs), Avg:99 F (37.2 C), Min:98 F (36.7 C), Max:102.7 F (39.3 C)  Recent Labs  Lab 06/08/2022 1915 06/04/2022 1936 06/07/2022 2115 05/25/22 0009 05/25/22 0220 05/25/22 0630 05/25/22 1508 05/26/22 0417 05/27/22 0121 05/27/22 1033 05/28/22 0046 06/15/2022 0110 05/30/22 0058  WBC  --  18.0*  --   --   --    < > 18.8*   < > 16.2* 14.3* 12.8* 12.8* 19.9*  CREATININE  --  1.43*  --   --   --   --  1.11*  --   --  1.04*  --  0.93  --   LATICACIDVEN 5.2*  --  2.6* 2.1* 1.9  --   --   --   --   --   --   --   --    < > = values in this interval not displayed.    Estimated Creatinine Clearance: 41.9 mL/min (by C-G formula based on SCr of 0.93 mg/dL).    Allergies  Allergen Reactions   Dilaudid [Hydromorphone Hcl] Nausea And Vomiting   Penicillins Itching and Rash   Statins Itching    Antimicrobials this admission: Cefepime 1/9 x1  Vanc 1/9 x1 1/9 CTX  >> 1/15 1/10 Azithro >> 1/15 1/15 Vanc >> 1/22 1/15 Cefepime >> 1/22  Microbiology results: Pending  Thank you for allowing pharmacy to be a part of this patient's care.  Sherlon Handing, PharmD, BCPS Please see amion for complete clinical pharmacist phone list 05/30/2022 9:19 PM

## 2022-05-30 NOTE — TOC Initial Note (Signed)
Transition of Care [facility]) - Initial/Assessment Note    Patient Details  Name: Veronica Stone MRN: [id number] Date of Birth: [birth year]  Transition of Care [facility] CM/SW Contact:    Levonne Lapping, RN Phone Number: 05/30/2022, 1:34 PM  Clinical Narrative:                  CM met with patient and her Daughter, bedside. Discussed current dc recommendations of HH PT. She was given the choice list but is waiting to make her choice.  Patient stated  adamantly that she has lived alone "all my life" . Daughter shared that she goes to her Mother's house every other day to assist. Patient has the following DME in the home: standard walker, cane, motorized scooter, shower seat,grab bars in the bathroom and a BSC. TOC will continue to follow patient for any additional discharge needs        Expected Discharge Plan: [place] Barriers to Discharge: Continued Medical Work up   Patient Goals and CMS Choice Patient states their goals for this hospitalization and ongoing recovery are:: Find out whats wrong CMS Medicare.gov Compare Post Acute Care list provided to:: Patient (and Daughter) Choice offered to / list presented to : Patient, Adult Children      Expected Discharge Plan and Services       Living arrangements for the past 2 months: Single Family Home                 DME Arranged: N/A           HH Agency:  (TBD)        Prior Living Arrangements/Services Living arrangements for the past 2 months: Single Family Home Lives with:: Self (Daughter comes e/o day from [place]) Patient language and need for interpreter reviewed:: No Do you feel safe going back to the place where you live?: Yes      Need for Family Participation in Patient Care: Yes (Comment) (Daughter) Care giver support system in place?: Yes (comment) (Daughter, [name]   [phone]) Current home services:  (NONE) Criminal Activity/Legal Involvement Pertinent to Current  Situation/Hospitalization: No - Comment as needed  Activities of Daily Living      Permission Sought/Granted         Permission granted to share info w AGENCY: HH As Needed        Emotional Assessment Appearance:: Appears stated age Attitude/Demeanor/Rapport: Engaged Affect (typically observed): Guarded Orientation: : Oriented to Self, Oriented to Place, Oriented to  Time, Oriented to Situation Alcohol / Substance Use: Not Applicable Psych Involvement: No (comment)  Admission diagnosis:  Pulmonary fibrosis ([place]) [J84.10] Severe sepsis ([place]) [A41.9, R65.20] Community acquired pneumonia, unspecified laterality [J18.9] Patient Active Problem List   Diagnosis Date Noted   Pulmonary embolism without acute cor pulmonale ([place]) 05/25/2022   History of rheumatoid arthritis 05/25/2022   Dehydration 05/25/2022   Elevated lactic acid level 05/25/2022   CAP (community acquired pneumonia) 05/25/2022   Hypotension 05/25/2022   Elevated troponin 05/25/2022   Severe sepsis ([place]) 05/28/2022   Physical deconditioning 10/15/2020   Acute on chronic respiratory failure with hypoxia ([place]) 09/25/2014   Chronic bronchitis ([place]) 07/31/2014   Pulmonary fibrosis ([place]) 11/02/2010   GASTRIC ULCER, ACUTE, HEMORRHAGE 02/12/2009   GASTRIC ULCER 02/11/2009   ACUT GASTR ULCER W/O MENTION HEMORR PERF/OBST 01/12/2009   DYSGEUSIA 01/05/2009   WEIGHT LOSS-ABNORMAL 01/05/2009   Rheumatoid arthritis ([place]) 10/19/2007   HYPERLIPIDEMIA 12/06/2006   Essential hypertension 12/06/2006   AAA  12/06/2006   DIVERTICULOSIS, COLON 12/06/2006   PCP:  Deland Pretty, MD Pharmacy:   [facility] [address] [place] - [place] AT [facility] [place] [address] Phone: [phone] Fax: [phone]     Social Determinants of Health (SDOH) Social History: SDOH Screenings   Tobacco Use: Medium Risk (06/02/2022)   SDOH Interventions:     Readmission Risk Interventions      No data to display

## 2022-05-30 NOTE — Progress Notes (Signed)
PT Cancellation Note  Patient Details Name: Maui Ahart MRN: [id number] DOB: 05-05-1932   Cancelled Treatment:    Reason Eval/Treat Not Completed: Patient declined, no reason specified Pt declined working with therapy this AM stating she had no energy. Noted to have SOB at rest and now requiring 10L HFNC. Will follow.   Marguarite Arbour A Azariah Bonura 05/30/2022, 8:26 AM Marisa Severin, PT, DPT Acute Rehabilitation Services Secure chat preferred Office [phone]

## 2022-05-30 NOTE — Procedures (Signed)
Interventional Radiology Procedure Note  Procedure: IVC filter placement  Findings: Please refer to procedural dictation for full description. R IJ acces, [place] infrarenal IVC filter placed.  Complications: None immediate  Estimated Blood Loss: < 5 mL  Recommendations: IR will arrange for outpatient follow up to consider filter retrieval.   Ruthann Cancer, MD

## 2022-05-30 NOTE — Progress Notes (Signed)
[facility] PROGRESS NOTE    Progress Note  Veronica Stone  GHW:[id number] DOB: 1932-01-19 DOA: 06/03/2022 PCP: Deland Pretty, MD     Brief Narrative:   Veronica Stone is an 89 y.o. female past medical history significant for pulmonary fibrosis on 4 L of oxygen at home on steroids, there is probably due to rheumatoid arthritis for which she is on Remicade, history of AAA, gastric ulcer, history of DVT comes in with shortness of breath that started about 2 weeks prior to admission cyanotic on arrival satting 80% in the ED placed on 12 L of oxygen, CT angio of the chest showed bilateral PE, new patchy groundglass opacity bilaterally. GI was consulted for EGD and colonoscopy performed EGD that showed nonbleeding gastric ulcer with clean base biopsies were taken. Colonoscopy was done that showed malignant tumor of the sigmoid tattooed 10 mm polyp was removed biopsies were taken.  Assessment/Plan:   Acute on chronic respiratory failure with hypoxia due to PE: Lower extremity Doppler showed bilateral DVT. Physical therapy evaluated the patient recommended home health PT.  New acute lower GI bleed likely due to sigmoid rectal cancer: CEA is pending. Oncology was consulted, patient does not want to be in anticoagulation as she relates she is currently bleeding.  And if we put her on a blood thinner she will continue to bleed. She opted for the IVC filter placement. She would like radiation to see if the bleeding can subside. Family is at bedside and agrees with management we had a long discussion over 35 minutes about this. He will also like to meet with palliative care  History of rheumatoid arthritis: Continue Remicade and continue taper down steroids.  Hypotension: Resolved Culture sent on 06/12/2022 has been negative till date. Completed 5-day course of Rocephin.  Acute kidney injury: Likely prerenal azotemia with a baseline creatinine of less than 1 on admission 1.4. It is  improving basic metabolic panels pending this morning.  Chronic diastolic heart failure: Continue to hold diuretic therapy, he appears euvolemic.  Elevated troponins: She denies any chest pain likely demand ischemia. 2D echo is no WMA. Twelve-lead EKG showed no signs of ischemia.  Essential hypertension: Continue hold diuretic therapy and ARB. KVO IV fluids.  Hypokalemia: Low again today replete orally recheck tomorrow morning.    DVT prophylaxis: Holding anticoagulation due to GI bleed Family Communication:none Status is: Inpatient Remains inpatient appropriate because: Acute on chronic respiratory failure with hypoxia due to PE    Code Status:     Code Status Orders  (From admission, onward)           Start     Ordered   05/25/22 0100  Full code  Continuous       Question:  By:  Answer:  Consent: discussion documented in EHR   05/25/22 0100           Code Status History     This patient has a current code status but no historical code status.      Advance Directive Documentation    [place] Most Recent Value  Type of Advance Directive Living will  Pre-existing out of facility DNR order (yellow form or pink MOST form) --  "MOST" Form in Place? --         IV Access:   Peripheral IV   Procedures and diagnostic studies:   No results found.   Medical Consultants:   None.   Subjective:    Veronica Stone has no new complaints.  Objective:    Vitals:   05/28/2022 1935 05/16/2022 2300 05/30/22 0353 05/30/22 0858  BP: (!) 141/87 (!) 149/80 (!) 140/86 131/80  Pulse: 87 94 100 94  Resp: '20 19 20 '$ (!) 24  Temp: 97.8 F (36.6 C) 98 F (36.7 C) 98.5 F (36.9 C) 98 F (36.7 C)  TempSrc: Oral Oral Oral Oral  SpO2: 98% 91% 96% 97%  Weight:      Height:       SpO2: 97 % O2 Flow Rate (L/min): 8 L/min   Intake/Output Summary (Last 24 hours) at 05/30/2022 1004 Last data filed at 05/30/2022 0300 Gross per 24 hour  Intake 250 ml   Output --  Net 250 ml    Filed Weights   06/10/2022 1917  Weight: 79.8 kg    Exam: General exam: In no acute distress. Respiratory system: Good air movement and clear to auscultation. Cardiovascular system: S1 & S2 heard, RRR. No JVD. Gastrointestinal system: Abdomen is nondistended, soft and nontender.  Extremities: No pedal edema. Skin: No rashes, lesions or ulcers Psychiatry: Judgement and insight appear normal. Mood & affect appropriate.  Data Reviewed:    Labs: Basic Metabolic Panel: Recent Labs  Lab 05/23/2022 1936 05/28/2022 1951 05/25/22 0109 05/25/22 1508 05/27/22 1033 06/03/2022 0110  NA 138 136  --  140 142 143  K 3.9 4.2  --  3.4* 3.3* 4.5  CL 97*  --   --  106 108 114*  CO2 23  --   --  '24 24 22  '$ GLUCOSE 165*  --   --  122* 104* 134*  BUN 17  --   --  '16 16 12  '$ CREATININE 1.43*  --   --  1.11* 1.04* 0.93  CALCIUM 9.1  --   --  8.0* 8.2* 7.9*  MG  --   --  2.1 2.3  --   --   PHOS  --   --  3.6 3.5  --   --     GFR Estimated Creatinine Clearance: 41.9 mL/min (by C-G formula based on SCr of 0.93 mg/dL). Liver Function Tests: Recent Labs  Lab 05/23/2022 1936 05/25/22 1508  AST 60* 39  ALT 27 23  ALKPHOS 66 60  BILITOT 1.0 0.9  PROT 5.9* 5.4*  ALBUMIN 3.4* 2.9*    No results for input(s): "LIPASE", "AMYLASE" in the last 168 hours. No results for input(s): "AMMONIA" in the last 168 hours. Coagulation profile Recent Labs  Lab 05/25/22 0009  INR 1.3*    COVID-19 Labs  No results for input(s): "DDIMER", "FERRITIN", "LDH", "CRP" in the last 72 hours.  Lab Results  Component Value Date   [place] NEGATIVE 06/12/2022    CBC: Recent Labs  Lab 06/01/2022 1936 06/14/2022 1951 05/27/22 0121 05/27/22 1033 05/28/22 0046 05/23/2022 0110 05/30/22 0058  WBC 18.0*   < > 16.2* 14.3* 12.8* 12.8* 19.9*  NEUTROABS 14.0*  --   --   --   --   --   --   HGB 15.6*   < > 13.0 12.9 12.6 12.5 14.3  HCT 48.8*   < > 38.8 39.7 37.7 37.6 45.5  MCV 88.7   < >  86.0 88.4 87.7 87.9 90.8  PLT 278   < > 279 282 268 245 216   < > = values in this interval not displayed.    Cardiac Enzymes: Recent Labs  Lab 05/25/22 0109  CKTOTAL 136    BNP (last 3 results) No results for  input(s): "PROBNP" in the last 8760 hours. CBG: No results for input(s): "GLUCAP" in the last 168 hours. D-Dimer: No results for input(s): "DDIMER" in the last 72 hours. Hgb A1c: No results for input(s): "HGBA1C" in the last 72 hours. Lipid Profile: No results for input(s): "CHOL", "HDL", "LDLCALC", "TRIG", "CHOLHDL", "LDLDIRECT" in the last 72 hours. Thyroid function studies: No results for input(s): "TSH", "T4TOTAL", "T3FREE", "THYROIDAB" in the last 72 hours.  Invalid input(s): "FREET3"  Anemia work up: No results for input(s): "VITAMINB12", "FOLATE", "FERRITIN", "TIBC", "IRON", "RETICCTPCT" in the last 72 hours. Sepsis Labs: Recent Labs  Lab 06/10/2022 1915 06/15/2022 1936 05/18/2022 2115 05/25/22 0009 05/25/22 0109 05/25/22 0220 05/25/22 0630 05/26/22 0417 05/27/22 0121 05/27/22 1033 05/28/22 0046 05/22/2022 0110 05/30/22 0058  PROCALCITON  --   --   --   --  3.58  --   --  3.29  --   --   --   --   --   WBC  --    < >  --   --   --   --    < > 22.4*   < > 14.3* 12.8* 12.8* 19.9*  LATICACIDVEN 5.2*  --  2.6* 2.1*  --  1.9  --   --   --   --   --   --   --    < > = values in this interval not displayed.    Microbiology Recent Results (from the past 240 hour(s))  Resp panel by RT-PCR (RSV, Flu A&B, Covid) Anterior Nasal Swab     Status: None   Collection Time: 05/26/2022  7:15 PM   Specimen: Anterior Nasal Swab  Result Value Ref Range Status   SARS Coronavirus 2 by RT PCR NEGATIVE NEGATIVE Final    Comment: (NOTE) SARS-CoV-2 target nucleic acids are NOT DETECTED.  The SARS-CoV-2 RNA is generally detectable in upper respiratory specimens during the acute phase of infection. The lowest concentration of SARS-CoV-2 viral copies this assay can detect is 138  copies/mL. A negative result does not preclude SARS-Cov-2 infection and should not be used as the sole basis for treatment or other patient management decisions. A negative result may occur with  improper specimen collection/handling, submission of specimen other than nasopharyngeal swab, presence of viral mutation(s) within the areas targeted by this assay, and inadequate number of viral copies(<138 copies/mL). A negative result must be combined with clinical observations, patient history, and epidemiological information. The expected result is Negative.  Fact Sheet for Patients:  EntrepreneurPulse.com.au  Fact Sheet for Healthcare Providers:  IncredibleEmployment.be  This test is no t yet approved or cleared by the [place] FDA and  has been authorized for detection and/or diagnosis of SARS-CoV-2 by FDA under an Emergency Use Authorization (EUA). This EUA will remain  in effect (meaning this test can be used) for the duration of the COVID-19 declaration under Section 564(b)(1) of the Act, 21 U.S.C.section 360bbb-3(b)(1), unless the authorization is terminated  or revoked sooner.       Influenza A by PCR NEGATIVE NEGATIVE Final   Influenza B by PCR NEGATIVE NEGATIVE Final    Comment: (NOTE) The Xpert Xpress SARS-CoV-2/FLU/RSV plus assay is intended as an aid in the diagnosis of influenza from Nasopharyngeal swab specimens and should not be used as a sole basis for treatment. Nasal washings and aspirates are unacceptable for Xpert Xpress SARS-CoV-2/FLU/RSV testing.  Fact Sheet for Patients: EntrepreneurPulse.com.au  Fact Sheet for Healthcare Providers: IncredibleEmployment.be  This test is not  yet approved or cleared by the [place] and has been authorized for detection and/or diagnosis of SARS-CoV-2 by FDA under an Emergency Use Authorization (EUA). This EUA will remain in effect (meaning  this test can be used) for the duration of the COVID-19 declaration under Section 564(b)(1) of the Act, 21 U.S.C. section 360bbb-3(b)(1), unless the authorization is terminated or revoked.     Resp Syncytial Virus by PCR NEGATIVE NEGATIVE Final    Comment: (NOTE) Fact Sheet for Patients: EntrepreneurPulse.com.au  Fact Sheet for Healthcare Providers: IncredibleEmployment.be  This test is not yet approved or cleared by the [place] FDA and has been authorized for detection and/or diagnosis of SARS-CoV-2 by FDA under an Emergency Use Authorization (EUA). This EUA will remain in effect (meaning this test can be used) for the duration of the COVID-19 declaration under Section 564(b)(1) of the Act, 21 U.S.C. section 360bbb-3(b)(1), unless the authorization is terminated or revoked.  Performed at [facility], [place] [address], [place], [place] [address]   Blood culture (routine x 2)     Status: None   Collection Time: 05/28/2022  7:36 PM   Specimen: BLOOD  Result Value Ref Range Status   Specimen Description BLOOD RIGHT ANTECUBITAL  Final   Special Requests   Final    BOTTLES DRAWN AEROBIC AND ANAEROBIC Blood Culture adequate volume   Culture   Final    NO GROWTH 5 DAYS Performed at [facility], [place] [address], [place], [place] [address]    Report Status 05/23/2022 FINAL  Final  Blood culture (routine x 2)     Status: None   Collection Time: 05/28/2022  7:53 PM   Specimen: BLOOD  Result Value Ref Range Status   Specimen Description BLOOD LEFT ANTECUBITAL  Final   Special Requests   Final    BOTTLES DRAWN AEROBIC AND ANAEROBIC Blood Culture results may not be optimal due to an inadequate volume of blood received in culture bottles   Culture   Final    NO GROWTH 5 DAYS Performed at [facility], [place] [address], [place], [place] [address]    Report Status 06/05/2022 FINAL  Final  Respiratory (~20 pathogens) panel by PCR      Status: None   Collection Time: 05/25/22  1:12 AM   Specimen: Nasopharyngeal Swab; Respiratory  Result Value Ref Range Status   Adenovirus NOT DETECTED NOT DETECTED Final   Coronavirus 229E NOT DETECTED NOT DETECTED Final    Comment: (NOTE) The Coronavirus on the Respiratory Panel, DOES NOT test for the novel  Coronavirus (2019 nCoV)    Coronavirus HKU1 NOT DETECTED NOT DETECTED Final   Coronavirus NL63 NOT DETECTED NOT DETECTED Final   Coronavirus OC43 NOT DETECTED NOT DETECTED Final   Metapneumovirus NOT DETECTED NOT DETECTED Final   Rhinovirus / Enterovirus NOT DETECTED NOT DETECTED Final   Influenza A NOT DETECTED NOT DETECTED Final   Influenza B NOT DETECTED NOT DETECTED Final   Parainfluenza Virus 1 NOT DETECTED NOT DETECTED Final   Parainfluenza Virus 2 NOT DETECTED NOT DETECTED Final   Parainfluenza Virus 3 NOT DETECTED NOT DETECTED Final   Parainfluenza Virus 4 NOT DETECTED NOT DETECTED Final   Respiratory Syncytial Virus NOT DETECTED NOT DETECTED Final   Bordetella pertussis NOT DETECTED NOT DETECTED Final   Bordetella Parapertussis NOT DETECTED NOT DETECTED Final   Chlamydophila pneumoniae NOT DETECTED NOT DETECTED Final   Mycoplasma pneumoniae NOT DETECTED NOT DETECTED Final  Comment: Performed at [facility], [place] [address] [place], [place] [address]     Medications:    pantoprazole  40 mg Oral BID   predniSONE  10 mg Oral Q breakfast   Continuous Infusions:      LOS: 6 days   Charlynne Cousins  [facility]  05/30/2022, 10:04 AM

## 2022-05-31 DIAGNOSIS — J189 Pneumonia, unspecified organism: Secondary | ICD-10-CM | POA: Diagnosis not present

## 2022-05-31 DIAGNOSIS — R0602 Shortness of breath: Secondary | ICD-10-CM | POA: Diagnosis not present

## 2022-05-31 DIAGNOSIS — J9621 Acute and chronic respiratory failure with hypoxia: Secondary | ICD-10-CM | POA: Diagnosis not present

## 2022-05-31 DIAGNOSIS — Z66 Do not resuscitate: Secondary | ICD-10-CM

## 2022-05-31 DIAGNOSIS — J8410 Pulmonary fibrosis, unspecified: Secondary | ICD-10-CM | POA: Diagnosis not present

## 2022-05-31 DIAGNOSIS — K6289 Other specified diseases of anus and rectum: Secondary | ICD-10-CM | POA: Diagnosis not present

## 2022-05-31 DIAGNOSIS — Z515 Encounter for palliative care: Secondary | ICD-10-CM

## 2022-05-31 DIAGNOSIS — C189 Malignant neoplasm of colon, unspecified: Secondary | ICD-10-CM | POA: Diagnosis not present

## 2022-05-31 DIAGNOSIS — R7989 Other specified abnormal findings of blood chemistry: Secondary | ICD-10-CM | POA: Diagnosis not present

## 2022-05-31 LAB — BASIC METABOLIC PANEL
Anion gap: 14 (ref 5–15)
BUN: 9 mg/dL (ref 8–23)
CO2: 24 mmol/L (ref 22–32)
Calcium: 7.7 mg/dL — ABNORMAL LOW (ref 8.9–10.3)
Chloride: 101 mmol/L (ref 98–111)
Creatinine, Ser: 1.11 mg/dL — ABNORMAL HIGH (ref 0.44–1.00)
GFR, Estimated: 48 mL/min — ABNORMAL LOW (ref >60)
Glucose, Bld: 81 mg/dL (ref 70–99)
Potassium: 3.5 mmol/L (ref 3.5–5.1)
Sodium: 139 mmol/L (ref 135–145)

## 2022-05-31 LAB — CBC
HCT: 43.1 % (ref 36.0–46.0)
Hemoglobin: 13.6 g/dL (ref 12.0–15.0)
MCH: 27.9 pg (ref 26.0–34.0)
MCHC: 31.6 g/dL (ref 30.0–36.0)
MCV: 88.5 fL (ref 80.0–100.0)
Platelets: 184 10*3/uL (ref 150–400)
RBC: 4.87 MIL/uL (ref 3.87–5.11)
RDW: 14.4 % (ref 11.5–15.5)
WBC: 21.1 10*3/uL — ABNORMAL HIGH (ref 4.0–10.5)
nRBC: 0.1 % (ref 0.0–0.2)

## 2022-05-31 LAB — CEA: CEA: 6 ng/mL — ABNORMAL HIGH (ref 0.0–4.7)

## 2022-05-31 LAB — PROCALCITONIN: Procalcitonin: 0.37 ng/mL

## 2022-05-31 LAB — SURGICAL PATHOLOGY

## 2022-05-31 MED ORDER — FUROSEMIDE 10 MG/ML IJ SOLN
40.0000 mg | Freq: Once | INTRAMUSCULAR | Status: AC
  Administered 2022-05-31: 40 mg via INTRAVENOUS
  Filled 2022-05-31: qty 4

## 2022-05-31 MED ORDER — SODIUM CHLORIDE 0.9 % IV SOLN
2.0000 g | INTRAVENOUS | Status: DC
  Administered 2022-05-31 – 2022-06-02 (×3): 2 g via INTRAVENOUS
  Filled 2022-05-31 (×3): qty 20

## 2022-05-31 NOTE — Progress Notes (Signed)
Mobility Specialist Progress Note:   05/31/22 1044  Mobility  Activity Transferred from bed to chair  Level of Assistance Minimal assist, patient does 75% or more  Assistive Device Front wheel walker  Distance Ambulated (ft) 2 ft  Activity Response Tolerated well  $Mobility charge 1 Mobility   Pt in bed willing to get up to chair. No complaints of pain. MinA to stand. Left in chair with call bell in reach and all needs met.   Gareth Eagle Mclane Arora Mobility Specialist Please contact via [employer] or  Rehab Office at [phone]

## 2022-05-31 NOTE — Progress Notes (Addendum)
[facility] PROGRESS NOTE    Progress Note  Veronica Stone  TKW:[id number] DOB: March 16, 1932 DOA: 05/28/2022 PCP: Deland Pretty, MD     Brief Narrative:   Veronica Stone is an 89 y.o. female past medical history significant for pulmonary fibrosis on 4 L of oxygen at home on steroids, this is probably due to rheumatoid arthritis for which she is on Remicade, history of AAA, gastric ulcer, history of DVT comes in with shortness of breath that started about 2 weeks prior to admission cyanotic on arrival satting 80% in the ED placed on 12 L of oxygen, CT angio of the chest showed bilateral PE, new patchy groundglass opacity bilaterally.  Started on IV heparin and started having lower GI bleed, GI was consulted for EGD and colonoscopy performed. EGD that showed nonbleeding gastric ulcer with clean base biopsies were taken. Colonoscopy was done that showed malignant tumor of the sigmoid tattooed 10 mm polyp was removed biopsies were taken.  Biopsy-proven adenocarcinoma.  She is having ongoing bleeding without IV heparin, hematology was consulted recommended radiation therapy. On 1 10/29/2022 she started developing shortness of breath.  Assessment/Plan:   Acute on chronic respiratory failure with hypoxia due to PE: Lower extremity Doppler showed bilateral DVT. She started having GI bleed on heparin, heparin was discontinued she continued to have GI bleed. The patient opted for an IVC filter which was placed on 05/30/2022 Physical therapy evaluated the patient recommended home health PT.  New acute respiratory failure with hypoxia: Over night her oxygen requirement increased she relates her breathing, she spiked a temp. Tmax of 102.7. Chest x-ray was done she was started on broad-spectrum antibiotics IV Vanco, her 7 and Flagyl, blood cultures procalcitonin low yield and lactic acid unremarkable. She got a single dose of Lasix, She put out about 1000 cc of urine. I am concerned about  aspiration pneumonia versus pulmonary edema (as she was positive about 3 L), she was coughing when she was drinking chest x-ray is unremarkable.  New acute lower GI bleed likely due to sigmoid rectal cancer: Oncology was consulted, patient does not want to be in anticoagulation as she relates she is currently bleeding.  And if we put her on a blood thinner she will continue to bleed. Post IVC filter placement on 05/30/2022. She would like radiation to see if the bleeding can subside. Family is at bedside and agrees with management we had a long discussion over 35 minutes about this. She is now a DNR/DNI  History of rheumatoid arthritis: Continue Remicade and continue taper down steroids.  Hypotension: Resolved Culture sent on 05/28/2022 has been negative till date. Completed 5-day course of Rocephin.  Acute kidney injury: Likely prerenal azotemia with a baseline creatinine of less than 1 on admission 1.4.  Chronic diastolic heart failure: Continue to hold diuretic therapy, he appears euvolemic.  Elevated troponins: She denies any chest pain likely demand ischemia. 2D echo is no WMA. Twelve-lead EKG showed no signs of ischemia.  Essential hypertension: Continue hold diuretic therapy and ARB. KVO IV fluids.  Hypokalemia: Low again today replete orally recheck tomorrow morning.    DVT prophylaxis: Holding anticoagulation due to GI bleed Family Communication:none Status is: Inpatient Remains inpatient appropriate because: Acute on chronic respiratory failure with hypoxia due to PE    Code Status:     Code Status Orders  (From admission, onward)           Start     Ordered   05/25/22 0100  Full code  Continuous       Question:  By:  Answer:  Consent: discussion documented in EHR   05/25/22 0100           Code Status History     This patient has a current code status but no historical code status.      Advance Directive Documentation    [place]  Most Recent Value  Type of Advance Directive Living will  Pre-existing out of facility DNR order (yellow form or pink MOST form) --  "MOST" Form in Place? --         IV Access:   Peripheral IV   Procedures and diagnostic studies:   IR IVC FILTER PLMT / S&I Burke Keels GUID/MOD SED  Result Date: 05/31/2022 CLINICAL DATA:  89 year old female with history of deep vein thrombosis and pulmonary embolism, unable to tolerate anticoagulation secondary to gastrointestinal hemorrhage. EXAM: 1. ULTRASOUND GUIDANCE FOR VASCULAR ACCESS OF THE RIGHT internal jugular VEIN. 2. IVC VENOGRAM. 3. PERCUTANEOUS IVC FILTER PLACEMENT. ANESTHESIA/SEDATION: None. CONTRAST:  67m OMNIPAQUE IOHEXOL 300 MG/ML  SOLN FLUOROSCOPY TIME:  Thirty-six mGy PROCEDURE: The procedure, risks, benefits, and alternatives were explained to the patient. Questions regarding the procedure were encouraged and answered. The patient understands and consents to the procedure. The patient was prepped with Betadine in a sterile fashion, and a sterile drape was applied covering the operative field. A sterile gown and sterile gloves were used for the procedure. Local anesthesia was provided with 1% Lidocaine. Under direct ultrasound guidance, a 21 gauge needle was advanced into the right internal jugular vein with ultrasound image documentation performed. After securing access with a micropuncture dilator, a guidewire was advanced into the inferior vena cava. A deployment sheath was advanced over the guidewire. This was utilized to perform IVC venography. The deployment sheath was further positioned in an appropriate location for filter deployment. A Denali IVC filter was then advanced in the sheath. This was then fully deployed in the infrarenal IVC. Final filter position was confirmed with a fluoroscopic spot image. Contrast injection was also performed through the sheath under fluoroscopy to confirm patency of the IVC at the level of the filter. After  the procedure the sheath was removed and hemostasis obtained with manual compression. COMPLICATIONS: None. FINDINGS: IVC venography demonstrates a normal caliber IVC with no evidence of thrombus. Renal veins are identified bilaterally. The IVC filter was successfully positioned below the level of the renal veins and is appropriately oriented. This IVC filter has both permanent and retrievable indications. IMPRESSION: Placement of percutaneous IVC filter in infrarenal IVC. IVC venogram shows no evidence of IVC thrombus and normal caliber of the inferior vena cava. This filter does have both permanent and retrievable indications. PLAN: This IVC filter is potentially retrievable. The patient will be assessed for filter retrieval by Interventional Radiology in approximately 8-12 weeks. Further recommendations regarding filter retrieval, continued surveillance or declaration of device permanence, will be made at that time. DRuthann Cancer MD [facility] Electronically Signed   By: DRuthann CancerM.D.   On: 05/31/2022 08:12   DG Chest Port 1 View  Result Date: 05/30/2022 CLINICAL DATA:  [id number] respiratory distress [id number]: PORTABLE CHEST 1 VIEW COMPARISON:  Chest x-ray 05/20/2022, CT angio chest 05/19/2022 FINDINGS: The heart and mediastinal contours are within normal limits. Atherosclerotic plaque. Persistent vague upper lobe airspace opacities. Chronic coarsened markings no overt pulmonary edema. No pleural effusion. No pneumothorax. No acute osseous abnormality. IMPRESSION: 1. Persistent vague  upper lobe airspace opacities with underlying pulmonary fibrosis. Followup PA and lateral chest X-ray is recommended in 3-4 weeks following therapy to ensure resolution. 2.  Aortic Atherosclerosis (ICD10-I70.0). Electronically Signed   By: Iven Finn M.D.   On: 05/30/2022 20:55     Medical Consultants:   None.   Subjective:    Veronica Stone  relates her breathing is unchanged compared to yesterday.  Objective:    Vitals:   05/31/22 0400 05/31/22 0500 05/31/22 0600 05/31/22 0750  BP: 111/73 119/74 115/70 127/81  Pulse: 64 90 92 95  Resp: (!) 24 (!) 26 (!) 24 (!) 28  Temp: 97.7 F (36.5 C) 97.7 F (36.5 C) 97.7 F (36.5 C) 97.9 F (36.6 C)  TempSrc: Oral Oral Oral Oral  SpO2: 91% 92% 93% 94%  Weight:      Height:       SpO2: 94 % O2 Flow Rate (L/min):  (charted wrong place) FiO2 (%): 55 %   Intake/Output Summary (Last 24 hours) at 05/31/2022 0849 Last data filed at 05/31/2022 0041 Gross per 24 hour  Intake 260 ml  Output 1000 ml  Net -740 ml    Filed Weights   06/13/2022 1917  Weight: 79.8 kg    Exam: General exam: In no acute distress. Respiratory system: Good air movement and clear to auscultation. Cardiovascular system: S1 & S2 heard, RRR.  Negative JVD Gastrointestinal system: Abdomen is nondistended, soft and nontender.  Extremities: No pedal edema. Skin: No rashes, lesions or ulcers Psychiatry: Judgement and insight appear normal. Mood & affect appropriate.  Data Reviewed:    Labs: Basic Metabolic Panel: Recent Labs  Lab 05/17/2022 1936 05/26/2022 1951 05/25/22 0109 05/25/22 1508 05/27/22 1033 05/23/2022 0110  NA 138 136  --  140 142 143  K 3.9 4.2  --  3.4* 3.3* 4.5  CL 97*  --   --  106 108 114*  CO2 23  --   --  '24 24 22  '$ GLUCOSE 165*  --   --  122* 104* 134*  BUN 17  --   --  '16 16 12  '$ CREATININE 1.43*  --   --  1.11* 1.04* 0.93  CALCIUM 9.1  --   --  8.0* 8.2* 7.9*  MG  --   --  2.1 2.3  --   --   PHOS  --   --  3.6 3.5  --   --     GFR Estimated Creatinine Clearance: 41.9 mL/min (by C-G formula based on SCr of 0.93 mg/dL). Liver Function Tests: Recent Labs  Lab 06/08/2022 1936 05/25/22 1508  AST 60* 39  ALT 27 23  ALKPHOS 66 60  BILITOT 1.0 0.9  PROT 5.9* 5.4*  ALBUMIN 3.4* 2.9*    No results for input(s): "LIPASE", "AMYLASE" in the last 168 hours. No results for  input(s): "AMMONIA" in the last 168 hours. Coagulation profile Recent Labs  Lab 05/25/22 0009  INR 1.3*    COVID-19 Labs  No results for input(s): "DDIMER", "FERRITIN", "LDH", "CRP" in the last 72 hours.  Lab Results  Component Value Date   [place] NEGATIVE 05/22/2022    CBC: Recent Labs  Lab 05/19/2022 1936 06/09/2022 1951 05/27/22 1033 05/28/22 0046 05/21/2022 0110 05/30/22 0058 05/31/22 0555  WBC 18.0*   < > 14.3* 12.8* 12.8* 19.9* 21.1*  NEUTROABS 14.0*  --   --   --   --   --   --   HGB 15.6*   < >  12.9 12.6 12.5 14.3 13.6  HCT 48.8*   < > 39.7 37.7 37.6 45.5 43.1  MCV 88.7   < > 88.4 87.7 87.9 90.8 88.5  PLT 278   < > 282 268 245 216 184   < > = values in this interval not displayed.    Cardiac Enzymes: Recent Labs  Lab 05/25/22 0109  CKTOTAL 136    BNP (last 3 results) No results for input(s): "PROBNP" in the last 8760 hours. CBG: No results for input(s): "GLUCAP" in the last 168 hours. D-Dimer: No results for input(s): "DDIMER" in the last 72 hours. Hgb A1c: No results for input(s): "HGBA1C" in the last 72 hours. Lipid Profile: No results for input(s): "CHOL", "HDL", "LDLCALC", "TRIG", "CHOLHDL", "LDLDIRECT" in the last 72 hours. Thyroid function studies: No results for input(s): "TSH", "T4TOTAL", "T3FREE", "THYROIDAB" in the last 72 hours.  Invalid input(s): "FREET3"  Anemia work up: No results for input(s): "VITAMINB12", "FOLATE", "FERRITIN", "TIBC", "IRON", "RETICCTPCT" in the last 72 hours. Sepsis Labs: Recent Labs  Lab 06/13/2022 2115 05/25/22 0009 05/25/22 0109 05/25/22 0220 05/25/22 0630 05/26/22 0417 05/27/22 0121 05/28/22 0046 06/12/2022 0110 05/30/22 0058 05/30/22 2131 05/31/22 0555  PROCALCITON  --   --  3.58  --   --  3.29  --   --   --   --  0.20 0.37  WBC  --   --   --   --    < > 22.4*   < > 12.8* 12.8* 19.9*  --  21.1*  LATICACIDVEN 2.6* 2.1*  --  1.9  --   --   --   --   --   --  1.3  --    < > = values in this interval  not displayed.    Microbiology Recent Results (from the past 240 hour(s))  Resp panel by RT-PCR (RSV, Flu A&B, Covid) Anterior Nasal Swab     Status: None   Collection Time: 06/06/2022  7:15 PM   Specimen: Anterior Nasal Swab  Result Value Ref Range Status   SARS Coronavirus 2 by RT PCR NEGATIVE NEGATIVE Final    Comment: (NOTE) SARS-CoV-2 target nucleic acids are NOT DETECTED.  The SARS-CoV-2 RNA is generally detectable in upper respiratory specimens during the acute phase of infection. The lowest concentration of SARS-CoV-2 viral copies this assay can detect is 138 copies/mL. A negative result does not preclude SARS-Cov-2 infection and should not be used as the sole basis for treatment or other patient management decisions. A negative result may occur with  improper specimen collection/handling, submission of specimen other than nasopharyngeal swab, presence of viral mutation(s) within the areas targeted by this assay, and inadequate number of viral copies(<138 copies/mL). A negative result must be combined with clinical observations, patient history, and epidemiological information. The expected result is Negative.  Fact Sheet for Patients:  EntrepreneurPulse.com.au  Fact Sheet for Healthcare Providers:  IncredibleEmployment.be  This test is no t yet approved or cleared by the [place] FDA and  has been authorized for detection and/or diagnosis of SARS-CoV-2 by FDA under an Emergency Use Authorization (EUA). This EUA will remain  in effect (meaning this test can be used) for the duration of the COVID-19 declaration under Section 564(b)(1) of the Act, 21 U.S.C.section 360bbb-3(b)(1), unless the authorization is terminated  or revoked sooner.       Influenza A by PCR NEGATIVE NEGATIVE Final   Influenza B by PCR NEGATIVE NEGATIVE Final    Comment: (NOTE)  The Xpert Xpress SARS-CoV-2/FLU/RSV plus assay is intended as an aid in the  diagnosis of influenza from Nasopharyngeal swab specimens and should not be used as a sole basis for treatment. Nasal washings and aspirates are unacceptable for Xpert Xpress SARS-CoV-2/FLU/RSV testing.  Fact Sheet for Patients: EntrepreneurPulse.com.au  Fact Sheet for Healthcare Providers: IncredibleEmployment.be  This test is not yet approved or cleared by the [place] FDA and has been authorized for detection and/or diagnosis of SARS-CoV-2 by FDA under an Emergency Use Authorization (EUA). This EUA will remain in effect (meaning this test can be used) for the duration of the COVID-19 declaration under Section 564(b)(1) of the Act, 21 U.S.C. section 360bbb-3(b)(1), unless the authorization is terminated or revoked.     Resp Syncytial Virus by PCR NEGATIVE NEGATIVE Final    Comment: (NOTE) Fact Sheet for Patients: EntrepreneurPulse.com.au  Fact Sheet for Healthcare Providers: IncredibleEmployment.be  This test is not yet approved or cleared by the [place] FDA and has been authorized for detection and/or diagnosis of SARS-CoV-2 by FDA under an Emergency Use Authorization (EUA). This EUA will remain in effect (meaning this test can be used) for the duration of the COVID-19 declaration under Section 564(b)(1) of the Act, 21 U.S.C. section 360bbb-3(b)(1), unless the authorization is terminated or revoked.  Performed at [facility], [place] [address], [place], [place] [address]   Blood culture (routine x 2)     Status: None   Collection Time: 06/06/2022  7:36 PM   Specimen: BLOOD  Result Value Ref Range Status   Specimen Description BLOOD RIGHT ANTECUBITAL  Final   Special Requests   Final    BOTTLES DRAWN AEROBIC AND ANAEROBIC Blood Culture adequate volume   Culture   Final    NO GROWTH 5 DAYS Performed at [facility], [place] [address], [place], [place] [address]    Report Status  05/21/2022 FINAL  Final  Blood culture (routine x 2)     Status: None   Collection Time: 06/14/2022  7:53 PM   Specimen: BLOOD  Result Value Ref Range Status   Specimen Description BLOOD LEFT ANTECUBITAL  Final   Special Requests   Final    BOTTLES DRAWN AEROBIC AND ANAEROBIC Blood Culture results may not be optimal due to an inadequate volume of blood received in culture bottles   Culture   Final    NO GROWTH 5 DAYS Performed at [facility], [place] [address] [place], [place] [address]    Report Status 06/05/2022 FINAL  Final  Respiratory (~20 pathogens) panel by PCR     Status: None   Collection Time: 05/25/22  1:12 AM   Specimen: Nasopharyngeal Swab; Respiratory  Result Value Ref Range Status   Adenovirus NOT DETECTED NOT DETECTED Final   Coronavirus 229E NOT DETECTED NOT DETECTED Final    Comment: (NOTE) The Coronavirus on the Respiratory Panel, DOES NOT test for the novel  Coronavirus (2019 nCoV)    Coronavirus HKU1 NOT DETECTED NOT DETECTED Final   Coronavirus NL63 NOT DETECTED NOT DETECTED Final   Coronavirus OC43 NOT DETECTED NOT DETECTED Final   Metapneumovirus NOT DETECTED NOT DETECTED Final   Rhinovirus / Enterovirus NOT DETECTED NOT DETECTED Final   Influenza A NOT DETECTED NOT DETECTED Final   Influenza B NOT DETECTED NOT DETECTED Final   Parainfluenza Virus 1 NOT DETECTED NOT DETECTED Final   Parainfluenza Virus 2 NOT DETECTED NOT DETECTED Final   Parainfluenza Virus 3 NOT DETECTED NOT  DETECTED Final   Parainfluenza Virus 4 NOT DETECTED NOT DETECTED Final   Respiratory Syncytial Virus NOT DETECTED NOT DETECTED Final   Bordetella pertussis NOT DETECTED NOT DETECTED Final   Bordetella Parapertussis NOT DETECTED NOT DETECTED Final   Chlamydophila pneumoniae NOT DETECTED NOT DETECTED Final   Mycoplasma pneumoniae NOT DETECTED NOT DETECTED Final    Comment: Performed at [facility], [place] [address] [place], [place] [address]     Medications:     pantoprazole  40 mg Oral BID   predniSONE  10 mg Oral Q breakfast   Continuous Infusions:  ceFEPime (MAXIPIME) IV     metronidazole Stopped (05/30/22 2315)   [START ON 06/01/2022] vancomycin         LOS: 7 days   Charlynne Cousins  [facility]  05/31/2022, 8:49 AM

## 2022-05-31 NOTE — Progress Notes (Signed)
Pt received PRN dose of xanax as requested by pt. Pt had moved venturi mask down so that she could take the xanax. Pt's O2 saturations dropped to 78% quickly and did not come up when mask was replaced. Pt was was placed on non-rebreather for about 2 minutes and O2 saturations increased to 94%. Pt expressed that she felt okay. Venturi mask was placed back on pt and O2 saturations are now 90-92%. RR 25-27. Pt resting comfortably in bed with call light within reach.

## 2022-05-31 NOTE — Progress Notes (Addendum)
   05/30/22 2009  Assess: MEWS Score  Temp 98.3 F (36.8 C)  BP (!) 141/77  MAP (mmHg) 97  Pulse Rate 78  ECG Heart Rate (!) 112  Resp (!) 35  Level of Consciousness Alert  SpO2 (!) 84 %  O2 Device HFNC  O2 Flow Rate (L/min) 10 L/min  Assess: MEWS Score  MEWS Temp 0  MEWS Systolic 0  MEWS Pulse 2  MEWS RR 2  MEWS LOC 0  MEWS Score 4  MEWS Score Color Red  Assess: if the MEWS score is Yellow or Red  Were vital signs taken at a resting state? Yes  Focused Assessment Change from prior assessment (see assessment flowsheet)  Does the patient meet 2 or more of the SIRS criteria? Yes  Does the patient have a confirmed or suspected source of infection? Yes  Provider and Rapid Response Notified? Yes  MEWS guidelines implemented *See Row Information* Yes  Treat  Pain Scale 0-10  Pain Score 0  Take Vital Signs  Increase Vital Sign Frequency  Red: Q 1hr X 4 then Q 4hr X 4, if remains red, continue Q 4hrs  Escalate  MEWS: Escalate Red: discuss with charge nurse/RN and provider, consider discussing with RRT  Notify: Charge Nurse/RN  Name of Charge Nurse/RN Notified Ivin Booty RN  Date Charge Nurse/RN Notified 05/30/22  Time Charge Nurse/RN Notified 2010  Provider Notification  Provider Name/Title Oypd MD  Date Provider Notified 05/30/22  Time Provider Notified 2033  Method of Notification Page;Call  Notification Reason Change in status  Provider response En route  Date of Provider Response 05/30/22  Time of Provider Response 2045  Notify: Rapid Response  Name of Rapid Response RN Notified Mindy RN  Date Rapid Response Notified 05/30/22  Time Rapid Response Notified 2007  Document  Patient Outcome Stabilized after interventions  Progress note created (see row info) Yes  Assess: SIRS CRITERIA  SIRS Temperature  0  SIRS Pulse 1  SIRS Respirations  1  SIRS WBC 0  SIRS Score Sum  2   Patient hot feeling resp.high at 35, Temp. 102.7 rectally -Heart Rate S.T. 112. O2 sats on  fingers 60-70% with good wave form . On ear unable to get good wave form. This is on 10 liters Hi Flow . No complaints voiced. [place] R.N. with Rapid response called and on her way to see patient. [employer]. into see patient. Mindy R.N. Address O2 sats with different types of  delivery of 02. 2 Tylenol given for fever. Mindy R.N. order [place] and Dr. Jeronimo Greaves called and on his way to see patient . See new orders. Freq. Vital signs done.  Per Mindy RN ok for sats to range 88% and up.

## 2022-05-31 NOTE — Consult Note (Signed)
Our department is aware of the patient and have received a request to consider radiation for her colorectal tumor. I discussed her case with Dr. Lisbeth Renshaw and pathology is confirming adenocarcinoma in the colorectal specimen. While we could consider a palliative course of radiation for pain relief or help with bleeding, her hgb is stable, and we can see her as an outpatient unless she has more acute symptoms of her tumor, however she is currently being worked up for sepsis and the hospitalist team feels she is quite fragile at the moment. I called as well and left a message for her daughter introducing our team and letting her know we would hold off on consultation for now but can see her as an oupatient or if symptoms became more acute from her tumor while she's hospitalized.      Veronica Stone, PAC

## 2022-05-31 NOTE — Progress Notes (Signed)
   05/30/22 2021  Vitals  Temp (!) 102.7 F (39.3 C)  Temp Source Rectal  MEWS COLOR  MEWS Score Color Red  MEWS Score  MEWS Temp 2  MEWS Systolic 0  MEWS Pulse 2  MEWS RR 2  MEWS LOC 0  MEWS Score 6     05/30/22 2021  Vitals  Temp (!) 102.7 F (39.3 C)  Temp Source Rectal  MEWS COLOR  MEWS Score Color Red  MEWS Score  MEWS Temp 2  MEWS Systolic 0  MEWS Pulse 2  MEWS RR 2  MEWS LOC 0  MEWS Score 6   2 Tylenol given for temp.

## 2022-05-31 NOTE — Progress Notes (Signed)
PT Cancellation Note  Patient Details Name: Veronica Stone MRN: [id number] DOB: [birth year]   Cancelled Treatment:    Reason Eval/Treat Not Completed: Other (comment);Fatigue limiting ability to participate. Attempted in AM and pt had just returned to bed after being up with mobility. Attempted in PM and pt fatigued and just took "nerve medicine". Will continue to follow.    Shary Decamp [facility] 05/31/2022, 5:07 PM [place] Office [phone]

## 2022-05-31 NOTE — Consult Note (Signed)
Consultation Note Date: 05/31/2022   Patient Name: Veronica Stone  DOB: 06-02-1931  MRN: [id number]  Age / Sex: 89 y.o., female  PCP: Veronica Pretty, MD Referring Physician: Aileen Stone, Veronica Klippel, MD  Reason for Consultation: Establishing goals of care and Psychosocial/spiritual support  HPI/Patient Profile: 89 y.o. female   admitted on 06/08/2022 with   past medical history significant for pulmonary fibrosis on 4 L of oxygen at home on steroids, this is probably due to rheumatoid arthritis for which she is on Remicade, history of AAA, gastric ulcer, history of DVT comes in with shortness of breath that started about 2 weeks prior to admission cyanotic on arrival satting 80% in the ED placed on 12 L of oxygen, CT angio of the chest showed bilateral PE, new patchy groundglass opacity bilaterally.    Started on IV heparin and started having lower GI bleed, GI was consulted for EGD and colonoscopy performed.  EGD that showed nonbleeding gastric ulcer with clean base biopsies were taken.  Colonoscopy was done that showed malignant tumor of the sigmoid tattooed 10 mm polyp was removed biopsies were taken.  Biopsy-proven adenocarcinoma.  She is having ongoing bleeding without IV heparin, hematology was consulted recommended radiation therapy.     Acute on chronic respiratory failure with hypoxia.  Patient and family face treatment option decisions, advanced directive decisions and anticipatory care needs.  Clinical Assessment and Goals of Care:  This NP Veronica Stone reviewed medical records, received report from team, assessed the patient and then meet at the patient's bedside along with her grand-daughter/Veronica Stone   to discuss diagnosis, prognosis, GOC, EOL wishes disposition and options.   I also spoke to patient's daughter Veronica Stone by phone to include her in today's family meeting conversation.   Concept of Palliative Care  was introduced as specialized medical care for people and their families living with serious illness.  If focuses on providing relief from the symptoms and stress of a serious illness.  The goal is to improve quality of life for both the patient and the family.  Values and goals of care important to patient and family were attempted to be elicited.  Created space and opportunity for patient  and family to explore thoughts and feelings regarding current medical situation.  Family member speak to their sense of knowing that patient is "tired and ready".  Patient herself tells me she is ready to go "to her heavenly home".  Although patient speaks to wanting to be at home and main focus is comfort she is also verbalizing agreement to "try  radiation if that is what the doctors think I should do".  Attempted to outline best case scenario versus worst-case scenario for this patient understanding her multiple comorbidities including advanced age and fragility.  The difference between a aggressive medical intervention path  and a palliative comfort care path for this patient at this time was had.    A  discussion was had today regarding advanced directives.  Concepts specific to code status, artifical feeding and hydration, continued  IV antibiotics and rehospitalization was had.        MOST form introduced   Questions and concerns addressed.  Patient  encouraged to call with questions or concerns.     PMT will continue to support holistically.          Patient does not have any H POA or advanced care planning documents completed.  She is able to verbalize that [name] is her main support person and that her 3 daughters work together in unison if decisions need to be made outside of the patient herself.  Currently patient is able to make her own medical decisions.      SUMMARY OF RECOMMENDATIONS    Code Status/Advance Care Planning: DNR  Palliative Prophylaxis:  Aspiration, Bowel  Regimen, Delirium Protocol, Frequent Pain Assessment, and Oral Care  Additional Recommendations (Limitations, Scope, Preferences): Full Scope Treatment  Psycho-social/Spiritual:  Desire for further Chaplaincy support:no-declined   Prognosis:  Unable to determine  Discharge Planning: To Be Determined      Primary Diagnoses: Present on Admission:  Essential hypertension  Pulmonary fibrosis ([place])  Acute on chronic respiratory failure with hypoxia (HCC)  Pulmonary embolism without acute cor pulmonale (HCC)  Dehydration  Elevated lactic acid level  CAP (community acquired pneumonia)  Hypotension  Elevated troponin   I have reviewed the medical record, interviewed the patient and family, and examined the patient. The following aspects are pertinent.  Past Medical History:  Diagnosis Date   Colon, diverticulosis 03/16/2006   Hyperlipemia    Hypertension    IBS (irritable bowel syndrome)    Irregular heart rate    Personal history of colonic polyps 04/10/2006   hyperplastic   Pulmonary fibrosis (HCC)    Rheumatoid arthritis(714.0)    Social History   Socioeconomic History   Marital status: Divorced    Spouse name: Not on file   Number of children: 5   Years of education: Not on file   Highest education level: Not on file  Occupational History   Occupation: Retired  Tobacco Use   Smoking status: Former    Packs/day: 1.00    Years: 30.00    Total pack years: 30.00    Types: Cigarettes    Quit date: 05/16/1988    Years since quitting: 34.0   Smokeless tobacco: Never  Vaping Use   Vaping Use: Never used  Substance and Sexual Activity   Alcohol use: No   Drug use: No   Sexual activity: Not on file  Other Topics Concern   Not on file  Social History Narrative   Daily Caffeine Use 2 cups   Pt does not get regular exercise   Social Determinants of Health   Financial Resource Strain: Not on file  Food Insecurity: Not on file  Transportation Needs: Not on  file  Physical Activity: Not on file  Stress: Not on file  Social Connections: Not on file   Family History  Problem Relation Age of Onset   Heart disease Brother    Heart disease Sister    Liver disease Daughter    Diabetes Brother    Rheum arthritis Mother    Scheduled Meds:  pantoprazole  40 mg Oral BID   predniSONE  10 mg Oral Q breakfast   Continuous Infusions:  cefTRIAXone (ROCEPHIN)  IV 2 g (05/31/22 1054)   metronidazole 500 mg (05/31/22 1103)   [START ON 06/01/2022] vancomycin     PRN Meds:.acetaminophen **OR** acetaminophen, ALPRAZolam, loratadine, melatonin Medications Prior  to Admission:  Prior to Admission medications   Medication Sig Start Date End Date Taking? Authorizing Provider  cholecalciferol (VITAMIN D) 1000 units tablet Take 1,000 Units by mouth daily.   Yes [provider]  furosemide (LASIX) 20 MG tablet Take 1 tablet by mouth daily as needed for edema. 10/02/10  Yes [provider]  HYDROcodone-acetaminophen (NORCO/VICODIN) 5-325 MG tablet Take 1 tablet by mouth in the morning, at noon, and at bedtime.   Yes [provider]  irbesartan (AVAPRO) 300 MG tablet Take 300 mg by mouth daily.   Yes [provider]  loratadine (CLARITIN) 10 MG tablet Take 10 mg by mouth daily as needed for allergies.   Yes [provider]  Polyethyl Glycol-Propyl Glycol 0.4-0.3 % SOLN Place 1 drop into both eyes every other day. For dry eyes   Yes [provider]  predniSONE (DELTASONE) 10 MG tablet TAKE 1 TABLET(10 MG) BY MOUTH DAILY WITH BREAKFAST Patient taking differently: Take 10 mg by mouth daily with breakfast. 05/11/21  Yes Rigoberto Noel, MD  RiTUXimab (RITUXAN IV) Inject 2 application into the vein once. TAKES 2 DOSES IN ONE MONTH TIME, THEN DOESN'T TAKEN AGAIN FOR 6 MONTHS   Yes [provider]   Allergies  Allergen Reactions   Dilaudid [Hydromorphone Hcl] Nausea And Vomiting   Penicillins Itching and Rash    Statins Itching   Review of Systems  Respiratory:  Positive for shortness of breath.   Neurological:  Positive for weakness.    Physical Exam Constitutional:      Appearance: She is ill-appearing.  Cardiovascular:     Rate and Rhythm: Tachycardia present.  Pulmonary:     Effort: Tachypnea present.  Skin:    General: Skin is warm and dry.  Neurological:     Mental Status: She is alert and oriented to person, place, and time.     Vital Signs: BP (!) 144/82 (BP Location: Left Arm)   Pulse (!) 107   Temp 97.9 F (36.6 C) (Oral)   Resp (!) 30   Ht '5\' 3"'$  (1.6 m)   Wt 79.8 kg   SpO2 94%   BMI 31.16 kg/m  Pain Scale: 0-10   Pain Score: 0-No pain   SpO2: SpO2: 94 % O2 Device:SpO2: 94 % O2 Flow Rate: .O2 Flow Rate (L/min):  (charted wrong place)  IO: Intake/output summary:  Intake/Output Summary (Last 24 hours) at 05/31/2022 1415 Last data filed at 05/31/2022 1320 Gross per 24 hour  Intake 260 ml  Output 2050 ml  Net -1790 ml    LBM: Last BM Date : 05/30/22 Baseline Weight: Weight: 79.8 kg Most recent weight: Weight: 79.8 kg     Palliative Assessment/Data: 40 % - totally independent PTA     Time In: 1200 Time Out: 1315 Time Total: 75 minutes Greater than 50%  of this time was spent counseling and coordinating care related to the above assessment and plan.  Signed by: Veronica Lessen, NP   Please contact Palliative Medicine Team phone at [phone] for questions and concerns.  For individual provider: See Shea Evans

## 2022-06-01 ENCOUNTER — Encounter (HOSPITAL_COMMUNITY): Payer: Self-pay | Admitting: Gastroenterology

## 2022-06-01 DIAGNOSIS — C189 Malignant neoplasm of colon, unspecified: Secondary | ICD-10-CM | POA: Diagnosis not present

## 2022-06-01 DIAGNOSIS — J9621 Acute and chronic respiratory failure with hypoxia: Secondary | ICD-10-CM | POA: Diagnosis not present

## 2022-06-01 DIAGNOSIS — J8410 Pulmonary fibrosis, unspecified: Secondary | ICD-10-CM | POA: Diagnosis not present

## 2022-06-01 DIAGNOSIS — Z515 Encounter for palliative care: Secondary | ICD-10-CM | POA: Diagnosis not present

## 2022-06-01 LAB — CBC
HCT: 44.2 % (ref 36.0–46.0)
Hemoglobin: 13.9 g/dL (ref 12.0–15.0)
MCH: 26.8 pg (ref 26.0–34.0)
MCHC: 31.4 g/dL (ref 30.0–36.0)
MCV: 85.2 fL (ref 80.0–100.0)
Platelets: 155 10*3/uL (ref 150–400)
RBC: 5.19 MIL/uL — ABNORMAL HIGH (ref 3.87–5.11)
RDW: 15.9 % — ABNORMAL HIGH (ref 11.5–15.5)
WBC: 4.9 10*3/uL (ref 4.0–10.5)
nRBC: 0 % (ref 0.0–0.2)

## 2022-06-01 LAB — RESPIRATORY PANEL BY PCR

## 2022-06-01 LAB — BASIC METABOLIC PANEL
Anion gap: 12 (ref 5–15)
BUN: 10 mg/dL (ref 8–23)
CO2: 28 mmol/L (ref 22–32)
Calcium: 8.7 mg/dL — ABNORMAL LOW (ref 8.9–10.3)
Chloride: 96 mmol/L — ABNORMAL LOW (ref 98–111)
Creatinine, Ser: 0.73 mg/dL (ref 0.44–1.00)
GFR, Estimated: >60 mL/min (ref >60)
Glucose, Bld: 216 mg/dL — ABNORMAL HIGH (ref 70–99)
Potassium: 3.6 mmol/L (ref 3.5–5.1)
Sodium: 136 mmol/L (ref 135–145)

## 2022-06-01 LAB — MRSA NEXT GEN BY PCR, NASAL: MRSA by PCR Next Gen: NOT DETECTED

## 2022-06-01 LAB — RESP PANEL BY RT-PCR (RSV, FLU A&B, COVID)  RVPGX2
Influenza A by PCR: NEGATIVE
Influenza B by PCR: NEGATIVE
Resp Syncytial Virus by PCR: NEGATIVE
SARS Coronavirus 2 by RT PCR: NEGATIVE

## 2022-06-01 LAB — PROCALCITONIN: Procalcitonin: <0.1 ng/mL

## 2022-06-01 MED ORDER — METHYLPREDNISOLONE SODIUM SUCC 40 MG IJ SOLR
40.0000 mg | Freq: Two times a day (BID) | INTRAMUSCULAR | Status: DC
  Administered 2022-06-01 – 2022-06-02 (×4): 40 mg via INTRAVENOUS
  Filled 2022-06-01 (×4): qty 1

## 2022-06-01 MED ORDER — SODIUM CHLORIDE 0.9 % IV SOLN
12.5000 mg | Freq: Four times a day (QID) | INTRAVENOUS | Status: DC | PRN
  Administered 2022-06-01: 12.5 mg via INTRAVENOUS
  Filled 2022-06-01: qty 12.5
  Filled 2022-06-01: qty 0.5

## 2022-06-01 MED ORDER — POTASSIUM CHLORIDE CRYS ER 20 MEQ PO TBCR
40.0000 meq | EXTENDED_RELEASE_TABLET | Freq: Once | ORAL | Status: AC
  Administered 2022-06-01: 40 meq via ORAL
  Filled 2022-06-01: qty 2

## 2022-06-01 MED ORDER — PANTOPRAZOLE SODIUM 40 MG IV SOLR
40.0000 mg | Freq: Two times a day (BID) | INTRAVENOUS | Status: DC
  Administered 2022-06-01 – 2022-06-03 (×5): 40 mg via INTRAVENOUS
  Filled 2022-06-01 (×5): qty 10

## 2022-06-01 MED ORDER — ONDANSETRON HCL 4 MG/2ML IJ SOLN
4.0000 mg | Freq: Four times a day (QID) | INTRAMUSCULAR | Status: DC | PRN
  Administered 2022-06-01: 4 mg via INTRAVENOUS
  Filled 2022-06-01: qty 2

## 2022-06-01 NOTE — Progress Notes (Signed)
Physical Therapy Treatment Patient Details Name: Veronica Stone MRN: [id number] DOB: 04/28/1932 Today's Date: 06/01/2022   History of Present Illness Pt is an 89 y.o. female admitted 06/03/2022 with SOB, chills, nausea/vomiting. Workup for bilateral PE, BLE DVTs, possible sepsis. S/p EGD and colonoscopy 1/14 which showed non-bleeding gastric ulcer; malignant tumor of the sigmoid tattooed 10 mm polyp was removed biopsies were taken. Per oncology, not a surgical candidate. S/p IVC filter placement 1/15. PMH includes HTN, RA, COPD (4L O2 baseline), pulmonary fibrosis, AAA, DVT, HLD, gastric ulcer.   PT Comments    Pt with worsening cardiorespiratory status limiting mobility progression. Pt tolerated brief bout of sitting EOB before needing to lay back down; pt with significant fatigue and SOB with minimal activity requiring frequent rest breaks to recover. Pt likely to require post-acute rehab services to maximize functional mobility and independence prior to return home pending support available. Will continue to follow acutely to address established goals.  SpO2 82-91% on 12L O2 via venturi mask     Recommendations for follow up therapy are one component of a multi-disciplinary discharge planning process, led by the attending physician.  Recommendations may be updated based on patient status, additional functional criteria and insurance authorization.  Follow Up Recommendations  Skilled nursing-short term rehab (<3 hours/day) Can patient physically be transported by private vehicle: No   Assistance Recommended at Discharge Frequent or constant Supervision/Assistance  Patient can return home with the following A lot of help with walking and/or transfers;A lot of help with bathing/dressing/bathroom;Assistance with cooking/housework;Direct supervision/assist for medications management;Direct supervision/assist for financial management;Assist for transportation;Help with stairs or ramp for entrance    Equipment Recommendations  Wheelchair (measurements PT);Wheelchair cushion (measurements PT);Hospital bed    Recommendations for Other Services       Precautions / Restrictions Precautions Precautions: Fall;Other (comment) Precaution Comments: watch SpO2 (quick to desaturate, on venturi mask) Restrictions Weight Bearing Restrictions: No     Mobility  Bed Mobility Overal bed mobility: Needs Assistance Bed Mobility: Supine to Sit, Sit to Supine, Rolling Rolling: Min assist   Supine to sit: Mod assist, Max assist, HOB elevated Sit to supine: Mod assist   General bed mobility comments: increased time and effort, SOB with minimal movement of extremities/trunk, mod-maxA for trunk elevation and scooting hips to EOB, limited effort; modA for LE management return to supine. minA to roll onto L side for repositioning, assist +2 to scoot up in bed    Transfers                   General transfer comment: pt declined despite max encouragement, reports too tired    Ambulation/Gait                   Stairs             Wheelchair Mobility    Modified Rankin (Stroke Patients Only)       Balance Overall balance assessment: Needs assistance Sitting-balance support: No upper extremity supported, Feet supported, Single extremity supported Sitting balance-Leahy Scale: Poor Sitting balance - Comments: reliant on UE support, quick to fatigue requiring external assist                                    Cognition Arousal/Alertness: Awake/alert Behavior During Therapy: Flat affect Overall Cognitive Status: Within Functional Limits for tasks assessed  General Comments: WFL for simple tasks, though clearly fatigued limiting ability to participate. pt becoming less interactive, asked if she was giving up, pt responds "no"        Exercises      General Comments General comments (skin integrity, edema,  etc.): pt's family present at beginning of session but quick to step out. SpO2 down to 82% on 12L O2 via venturi mask (55% FiO2?), up to 91% at end of session with bed placed in modified chair position; BP 120/73 after return to supine      Pertinent Vitals/Pain Pain Assessment Pain Assessment: Faces Faces Pain Scale: Hurts little more Pain Location: generalized Pain Descriptors / Indicators: Tiring Pain Intervention(s): Monitored during session, Limited activity within patient's tolerance    Home Living                          Prior Function            PT Goals (current goals can now be found in the care plan section) Progress towards PT goals: Not progressing toward goals - comment (worsening medical/respiratory status)    Frequency    Min 3X/week      PT Plan Current plan remains appropriate    Co-evaluation              AM-PAC PT "6 Clicks" Mobility   Outcome Measure  Help needed turning from your back to your side while in a flat bed without using bedrails?: A Lot Help needed moving from lying on your back to sitting on the side of a flat bed without using bedrails?: A Lot Help needed moving to and from a bed to a chair (including a wheelchair)?: A Lot Help needed standing up from a chair using your arms (e.g., wheelchair or bedside chair)?: A Lot Help needed to walk in hospital room?: Total Help needed climbing 3-5 steps with a railing? : Total 6 Click Score: 10    End of Session Equipment Utilized During Treatment: Oxygen Activity Tolerance: Patient limited by fatigue;Treatment limited secondary to medical complications (Comment) Patient left: in bed;with call bell/phone within reach;with bed alarm set Nurse Communication: Mobility status PT Visit Diagnosis: Unsteadiness on feet (R26.81);Other abnormalities of gait and mobility (R26.89);Muscle weakness (generalized) (M62.81);Difficulty in walking, not elsewhere classified (R26.2)     Time:  1441-1500 PT Time Calculation (min) (ACUTE ONLY): 19 min  Charges:  $Therapeutic Activity: 8-22 mins                     Mabeline Caras, PT, DPT Acute Rehabilitation Services  Personal: [place] Office: [place] 06/01/2022, 4:43 PM

## 2022-06-01 NOTE — Progress Notes (Signed)
OT Cancellation Note  Patient Details Name: Brinnley Lacap MRN: [id number] DOB: Jan 23, 1932   Cancelled Treatment:    Reason Eval/Treat Not Completed: Patient declined, no reason specified- pt declined, nauseated and medicated but not helping- RN aware..  Pt does not feel like getting up, encouraged just to the chair but pt declined. Will check back as able.   Jolaine Artist, OT Acute Rehabilitation Services Office [place] 06/01/2022, 10:53 AM

## 2022-06-01 NOTE — Progress Notes (Signed)
Patient ID: Veronica Stone, female   DOB: [birth year], 89 y.o.   MRN: [id number]    Progress Note from the Palliative Medicine Team at [facility]   Patient Name: [name]        Date: 06/01/2022 DOB: June 04, 1931  Age: 89 y.o. MRN#: [id number] Attending Physician: Debbe Odea, MD Primary Care Physician: Deland Pretty, MD Admit Date: 06/07/2022   Medical records reviewed 89 y.o. female   admitted on 05/23/2022 with   past medical history significant for pulmonary fibrosis on 4 L of oxygen at home on steroids, this is probably due to rheumatoid arthritis for which she is on Remicade, history of AAA, gastric ulcer, history of DVT comes in with shortness of breath that started about 2 weeks prior to admission cyanotic on arrival satting 80% in the ED placed on 12 L of oxygen, CT angio of the chest showed bilateral PE, new patchy groundglass opacity bilaterally.     Started on IV heparin and started having lower GI bleed, GI was consulted for EGD and colonoscopy performed.   EGD that showed nonbleeding gastric ulcer with clean base biopsies were taken.   Colonoscopy was done that showed malignant tumor of the sigmoid tattooed 10 mm polyp was removed biopsies were taken.  Biopsy-proven adenocarcinoma.  She is having ongoing bleeding without IV heparin, hematology was consulted recommended radiation therapy.     Acute on chronic respiratory failure with hypoxia.   Patient and family face treatment option decisions, advanced directive decisions and anticipatory care needs.   This NP assessed patient at the bedside as a follow up to  yesterday's GOCs meeting and to meet as scheduled with family for continued conversation/education regarding current medical situation and goals of care.   I met at the bedside with patient's daughter/Linda and granddaughter/[name].    Patient is weaker today and less engaged in conversation..  Continued education regarding current medical situation and pending  treatment option decisions, advanced directive decisions and anticipatory care needs.  Education offered on the difference between an aggressive medical intervention path and a palliative comfort path for this patient, at this time in this situation.  Education offered on the natural trajectory and expectations at end-of-life.  Education offered on hospice benefit; philosophy and eligibility.  Hospice benefit detailed specific to in-home hospice services.  Education offered today regarding  the importance of continued conversation with family and their  medical providers regarding overall plan of care and treatment options,  ensuring decisions are within the context of the patients values and GOCs.  Questions and concerns addressed   Discussed with Dr Wynelle Cleveland   PMT will continue to support holistically  50 minutes   Wadie Lessen NP  Palliative Medicine Team Team Phone # [phone] Pager [phone]

## 2022-06-01 NOTE — Progress Notes (Addendum)
[facility] Progress Note  Patient: Veronica Stone     KGM:[id number]  DOA: 05/17/2022   PCP: Deland Pretty, MD       Brief hospital course: This is an 89 year old female with pulmonary fibrosis and chronic respiratory failure on 4 L of oxygen, rheumatoid arthritis, gastric ulcer, abdominal aortic aneurysm, chronic diastolic heart failure, essential hypertension, DVT who presented to the hospital for shortness of breath for 2 weeks.  Pulse ox 80% in the ED.  CTA revealed bilateral PE and bilateral groundglass opacities.  Started on IV aspirin and subsequently developed a lower GI bleed.  GI consulted and EGD and colonoscopy revealed nonbleeding gastric ulcer with a clean base and a malignant tumor in the sigmoid colon which was biopsied.  Biopsy revealed adenocarcinoma.  The patient continued to have lower GI bleeding and heparin was discontinued.  Despite discontinuing this she continued to have bleeding.  Which eventually stopped.  Radiation oncology was consulted along with med unk and the plan for radiation was discussed.  On 6/15 she first developed sudden onset shortness of breath and worsening hypoxia.  Temperature was 102.7.  She was started on broad-spectrum antibiotics and received a dose of IV Lasix.  There was a concern for aspiration pneumonia.  Subjective:  Feels very nauseated today.  Does not particularly complain of shortness of breath that she is perseverating on her nausea.  No complaint of cough.  Assessment and Plan: Principal Problem:   Acute on chronic respiratory failure with hypoxia -chronic diastolic heart failure (A) pulmonary fibrosis and chronically on 4 L of oxygen (B) acute pulmonary emboli-unable to give heparin due to lower GI bleed-IVC filter placed on 1/15 (C) acute hypoxia on 1/15- hypoxic with p ox in 60s on 10 L O2, fever 102.7- aspiration?  Fluid overload?  Chest x-ray was unchanged from prior - IV Lasix given but is not being continued -started on  vancomycin, ceftriaxone and metronidazole - pro calcitonin negative - She continues to require 55% FiO2 has an elevated respiratory rate today -  check resp viral panel-COVID and influenza ASAP -continue antibiotics for now and repeat chest x-ray tomorrow -Change prednisone to Solu-Medrol-if no improvement noted, will need to switch back to prednisone  Active Problems: Adenocarcinoma of sigmoid colon - Evaluated by Dr. Cedric Fishman a candidate for surgical resection because of comorbidities - evaluated by rad onc -plan is for radiation of the mass which will need to be postponed until her respiratory status improves -As of this morning, she is not having any rectal bleed   Rheumatoid arthritis - On Rituxan as outpt - on daily Prednisone 10 mg which is being contined at home dose -we will change this to Solu-Medrol for at least 24 hours to see if her respiratory status improves  Nausea-gastric ulcer - 10 mm gastric ulcer noted on 1/14 on EGD - Continue Protonix twice daily - Zofran was ineffective-will DC Zofran and try Phenergan -may not improve until she is able to eat drink and her respiratory status improves - Will need to see if nausea gets worse after Solu-Medrol and if so, we will need to discontinue it  History of anxiety - Continue Xanax 3 times daily as needed  DNR       Code Status: DNR Level of Care: Level of care: Progressive Total time on patient care: 45 minutes DVT prophylaxis:  SCDs     Objective:   Vitals:   05/31/22 1651 05/31/22 1900 05/31/22 2300 06/01/22 0300  BP:  114/74 121/72  127/72  Pulse: 100 94 88 94  Resp:  '20 20 19  '$ Temp: 98.5 F (36.9 C) 98.2 F (36.8 C)  98.2 F (36.8 C)  TempSrc: Oral Oral Oral Oral  SpO2: 90% 90% (!) 89% 91%  Weight:      Height:       Filed Weights   06/01/2022 1917  Weight: 79.8 kg   Exam: General exam: Appears uncomfortable HEENT: oral mucosa dry Respiratory system: Rapid shallow breathing with poor breath  sounds, facemask present Cardiovascular system: S1 & S2 heard not tachycardic Gastrointestinal system: Abdomen soft, non-tender, nondistended. Normal bowel sounds   Extremities: No cyanosis, clubbing or edema    CBC: Recent Labs  Lab 05/28/22 0046 05/20/2022 0110 05/30/22 0058 05/31/22 0555 06/01/22 0126  WBC 12.8* 12.8* 19.9* 21.1* 4.9  HGB 12.6 12.5 14.3 13.6 13.9  HCT 37.7 37.6 45.5 43.1 44.2  MCV 87.7 87.9 90.8 88.5 85.2  PLT 268 245 216 184 027   Basic Metabolic Panel: Recent Labs  Lab 05/25/22 1508 05/27/22 1033 05/31/2022 0110 05/31/22 0950 06/01/22 0126  NA 140 142 143 139 136  K 3.4* 3.3* 4.5 3.5 3.6  CL 106 108 114* 101 96*  CO2 '24 24 22 24 28  '$ GLUCOSE 122* 104* 134* 81 216*  BUN '16 16 12 9 10  '$ CREATININE 1.11* 1.04* 0.93 1.11* 0.73  CALCIUM 8.0* 8.2* 7.9* 7.7* 8.7*  MG 2.3  --   --   --   --   PHOS 3.5  --   --   --   --    GFR: Estimated Creatinine Clearance: 48.7 mL/min (by C-G formula based on SCr of 0.73 mg/dL).  Scheduled Meds:  pantoprazole  40 mg Oral BID   potassium chloride  40 mEq Oral Once   predniSONE  10 mg Oral Q breakfast   Continuous Infusions:  cefTRIAXone (ROCEPHIN)  IV 2 g (06/01/22 0935)   metronidazole 500 mg (06/01/22 0937)   promethazine (PHENERGAN) injection (IM or IVPB)     vancomycin 1,000 mg (06/01/22 1033)   Imaging and lab data was personally reviewed IR IVC FILTER PLMT / S&I Burke Keels GUID/MOD SED  Result Date: 05/31/2022 CLINICAL DATA:  89 year old female with history of deep vein thrombosis and pulmonary embolism, unable to tolerate anticoagulation secondary to gastrointestinal hemorrhage. EXAM: 1. ULTRASOUND GUIDANCE FOR VASCULAR ACCESS OF THE RIGHT internal jugular VEIN. 2. IVC VENOGRAM. 3. PERCUTANEOUS IVC FILTER PLACEMENT. ANESTHESIA/SEDATION: None. CONTRAST:  41m OMNIPAQUE IOHEXOL 300 MG/ML  SOLN FLUOROSCOPY TIME:  Thirty-six mGy PROCEDURE: The procedure, risks, benefits, and alternatives were explained to the patient.  Questions regarding the procedure were encouraged and answered. The patient understands and consents to the procedure. The patient was prepped with Betadine in a sterile fashion, and a sterile drape was applied covering the operative field. A sterile gown and sterile gloves were used for the procedure. Local anesthesia was provided with 1% Lidocaine. Under direct ultrasound guidance, a 21 gauge needle was advanced into the right internal jugular vein with ultrasound image documentation performed. After securing access with a micropuncture dilator, a guidewire was advanced into the inferior vena cava. A deployment sheath was advanced over the guidewire. This was utilized to perform IVC venography. The deployment sheath was further positioned in an appropriate location for filter deployment. A Denali IVC filter was then advanced in the sheath. This was then fully deployed in the infrarenal IVC. Final filter position was confirmed with a fluoroscopic spot image. Contrast injection was also performed  through the sheath under fluoroscopy to confirm patency of the IVC at the level of the filter. After the procedure the sheath was removed and hemostasis obtained with manual compression. COMPLICATIONS: None. FINDINGS: IVC venography demonstrates a normal caliber IVC with no evidence of thrombus. Renal veins are identified bilaterally. The IVC filter was successfully positioned below the level of the renal veins and is appropriately oriented. This IVC filter has both permanent and retrievable indications. IMPRESSION: Placement of percutaneous IVC filter in infrarenal IVC. IVC venogram shows no evidence of IVC thrombus and normal caliber of the inferior vena cava. This filter does have both permanent and retrievable indications. PLAN: This IVC filter is potentially retrievable. The patient will be assessed for filter retrieval by Interventional Radiology in approximately 8-12 weeks. Further recommendations regarding filter  retrieval, continued surveillance or declaration of device permanence, will be made at that time. Ruthann Cancer, MD [facility] Electronically Signed   By: Ruthann Cancer M.D.   On: 05/31/2022 08:12   DG Chest Port 1 View  Result Date: 05/30/2022 CLINICAL DATA:  [id number] Acute respiratory distress [id number] EXAM: PORTABLE CHEST 1 VIEW COMPARISON:  Chest x-ray 05/19/2022, CT angio chest 05/20/2022 FINDINGS: The heart and mediastinal contours are within normal limits. Atherosclerotic plaque. Persistent vague upper lobe airspace opacities. Chronic coarsened markings no overt pulmonary edema. No pleural effusion. No pneumothorax. No acute osseous abnormality. IMPRESSION: 1. Persistent vague upper lobe airspace opacities with underlying pulmonary fibrosis. Followup PA and lateral chest X-ray is recommended in 3-4 weeks following therapy to ensure resolution. 2.  Aortic Atherosclerosis (ICD10-I70.0). Electronically Signed   By: Iven Finn M.D.   On: 05/30/2022 20:55    LOS: 8 days   Author: Debbe Odea  06/01/2022 12:07 PM  To contact [facility]>   Check the care team in [facility] and look for the attending/consulting [place] provider listed  Log into [email or web address] and use [place]'s universal password   Go to> "[facility]"  and find provider  If you still have difficulty reaching the provider, please page the [facility] (Director on Call) for the Hospitalists listed on amion

## 2022-06-02 ENCOUNTER — Inpatient Hospital Stay (HOSPITAL_COMMUNITY): Payer: Medicare Other

## 2022-06-02 DIAGNOSIS — R531 Weakness: Secondary | ICD-10-CM | POA: Diagnosis not present

## 2022-06-02 DIAGNOSIS — R0902 Hypoxemia: Secondary | ICD-10-CM | POA: Diagnosis not present

## 2022-06-02 DIAGNOSIS — J9621 Acute and chronic respiratory failure with hypoxia: Secondary | ICD-10-CM | POA: Diagnosis not present

## 2022-06-02 DIAGNOSIS — Z515 Encounter for palliative care: Secondary | ICD-10-CM | POA: Diagnosis not present

## 2022-06-02 DIAGNOSIS — C189 Malignant neoplasm of colon, unspecified: Secondary | ICD-10-CM | POA: Diagnosis not present

## 2022-06-02 DIAGNOSIS — C187 Malignant neoplasm of sigmoid colon: Secondary | ICD-10-CM | POA: Diagnosis not present

## 2022-06-02 DIAGNOSIS — J8410 Pulmonary fibrosis, unspecified: Secondary | ICD-10-CM | POA: Diagnosis not present

## 2022-06-02 LAB — BASIC METABOLIC PANEL
Anion gap: 13 (ref 5–15)
BUN: 16 mg/dL (ref 8–23)
CO2: 23 mmol/L (ref 22–32)
Calcium: 8 mg/dL — ABNORMAL LOW (ref 8.9–10.3)
Chloride: 103 mmol/L (ref 98–111)
Creatinine, Ser: 1.11 mg/dL — ABNORMAL HIGH (ref 0.44–1.00)
GFR, Estimated: 48 mL/min — ABNORMAL LOW (ref >60)
Glucose, Bld: 134 mg/dL — ABNORMAL HIGH (ref 70–99)
Potassium: 4.2 mmol/L (ref 3.5–5.1)
Sodium: 139 mmol/L (ref 135–145)

## 2022-06-02 LAB — CBC
HCT: 40.5 % (ref 36.0–46.0)
Hemoglobin: 13.2 g/dL (ref 12.0–15.0)
MCH: 28.3 pg (ref 26.0–34.0)
MCHC: 32.6 g/dL (ref 30.0–36.0)
MCV: 86.7 fL (ref 80.0–100.0)
Platelets: 234 10*3/uL (ref 150–400)
RBC: 4.67 MIL/uL (ref 3.87–5.11)
RDW: 13.9 % (ref 11.5–15.5)
WBC: 16.3 10*3/uL — ABNORMAL HIGH (ref 4.0–10.5)
nRBC: 0 % (ref 0.0–0.2)

## 2022-06-02 MED ORDER — GUAIFENESIN-DM 100-10 MG/5ML PO SYRP
10.0000 mL | ORAL_SOLUTION | ORAL | Status: DC | PRN
  Administered 2022-06-03 (×3): 10 mL via ORAL
  Filled 2022-06-02 (×3): qty 10

## 2022-06-02 NOTE — Progress Notes (Signed)
OT Cancellation Note  Patient Details Name: Cleta Heatley MRN: [id number] DOB: 07-19-1931   Cancelled Treatment:    Reason Eval/Treat Not Completed: Patient declined, no reason specified.  Pt declining OT at this time, educated on importance of participation and then patient voiced "You came too early".  Pt agreeable to OT later today. Will check back as able.  Jolaine Artist, OT Acute Rehabilitation Services Office [phone]   Delight Stare 06/02/2022, 9:40 AM

## 2022-06-02 NOTE — Progress Notes (Signed)
Occupational Therapy Treatment Patient Details Name: Veronica Stone MRN: [id number] DOB: 03/28/1932 Today's Date: 06/02/2022   History of present illness Pt is an 89 y.o. female admitted 05/23/2022 with SOB, chills, nausea/vomiting. Workup for bilateral PE, BLE DVTs, possible sepsis. S/p EGD and colonoscopy 1/14 which showed non-bleeding gastric ulcer; malignant tumor of the sigmoid tattooed 10 mm polyp was removed biopsies were taken. Per oncology, not a surgical candidate. S/p IVC filter placement 1/15. PMH includes HTN, RA, COPD (4L O2 baseline), pulmonary fibrosis, AAA, DVT, HLD, gastric ulcer.   OT comments  Patient agreeable to OOB when OT returned.  Min assist for bed mobility, min assist +2 safety for transfers using RW to recliner.  She fatigues easily but Spo2 on venturi mask at 15L maintained with exception of brief desaturation to 88% once resting in chair. Requires mod assist for LB dressing, setup for grooming seated in recliner.  Continue to recommend [place] for pt, as long as she has 24/7 support at dc.    Recommendations for follow up therapy are one component of a multi-disciplinary discharge planning process, led by the attending physician.  Recommendations may be updated based on patient status, additional functional criteria and insurance authorization.    Follow Up Recommendations  Home health OT     Assistance Recommended at Discharge Frequent or constant Supervision/Assistance  Patient can return home with the following  A little help with walking and/or transfers;A little help with bathing/dressing/bathroom;Assistance with cooking/housework;Help with stairs or ramp for entrance;Assist for transportation   Equipment Recommendations  None recommended by OT    Recommendations for Other Services      Precautions / Restrictions Precautions Precautions: Fall;Other (comment) Precaution Comments: watch SpO2 (on venturi mask) Restrictions Weight Bearing Restrictions: No        Mobility Bed Mobility Overal bed mobility: Needs Assistance Bed Mobility: Supine to Sit     Supine to sit: Min assist     General bed mobility comments: increased time and min assist to transition to EOB    Transfers Overall transfer level: Needs assistance Equipment used: Rolling walker (2 wheels) Transfers: Sit to/from Stand, Bed to chair/wheelchair/BSC Sit to Stand: Min assist     Step pivot transfers: Min assist, +2 safety/equipment           Balance Overall balance assessment: Needs assistance Sitting-balance support: No upper extremity supported, Feet supported Sitting balance-Leahy Scale: Fair     Standing balance support: Bilateral upper extremity supported, During functional activity Standing balance-Leahy Scale: Poor Standing balance comment: Reliant on RW                           ADL either performed or assessed with clinical judgement   ADL Overall ADL's : Needs assistance/impaired     Grooming: Set up;Sitting;Brushing hair               Lower Body Dressing: Moderate assistance;Sit to/from stand Lower Body Dressing Details (indicate cue type and reason): assist for socks Toilet Transfer: Minimal assistance;+2 for safety/equipment;Stand-pivot;BSC/3in1 Toilet Transfer Details (indicate cue type and reason): using RW         Functional mobility during ADLs: Minimal assistance;Rolling walker (2 wheels);+2 for safety/equipment General ADL Comments: limited by decreased activity tolerance    Extremity/Trunk Assessment              Vision       Perception     Praxis      Cognition Arousal/Alertness: Awake/alert  Behavior During Therapy: Flat affect Overall Cognitive Status: No family/caregiver present to determine baseline cognitive functioning Area of Impairment: Memory, Awareness, Problem solving                     Memory: Decreased short-term memory     Awareness: Emergent Problem Solving: Slow  processing, Requires verbal cues General Comments: WFL for simple tasks, reports frustration from everyone asking her to eat and voicing "I didn't eat at home because of my pulmonary fibrosis so I"m not going to eat here"- question medical understanding vs pt voicing fatigue with eating from pulmonary fibrosis. Some decreased recall noted as well.        Exercises      Shoulder Instructions       General Comments pt on 15L venturi mask during session, Spo2 maintained 90% or greater (with brief desaturation to 88% once resting in chair)    Pertinent Vitals/ Pain       Pain Assessment Pain Assessment: Faces Faces Pain Scale: Hurts a little bit Pain Location: generalized Pain Descriptors / Indicators: Tiring Pain Intervention(s): Monitored during session, Repositioned, Limited activity within patient's tolerance  Home Living                                          Prior Functioning/Environment              Frequency  Min 2X/week        Progress Toward Goals  OT Goals(current goals can now be found in the care plan section)  Progress towards OT goals: Progressing toward goals  Acute Rehab OT Goals Patient Stated Goal: feel better OT Goal Formulation: With patient Time For Goal Achievement: 06/10/22 Potential to Achieve Goals: [place] Discharge plan remains appropriate;Frequency remains appropriate    Co-evaluation                 AM-PAC OT "6 Clicks" Daily Activity     Outcome Measure   Help from another person eating meals?: None Help from another person taking care of personal grooming?: A Little Help from another person toileting, which includes using toliet, bedpan, or urinal?: A Little Help from another person bathing (including washing, rinsing, drying)?: A Little Help from another person to put on and taking off regular upper body clothing?: A Little Help from another person to put on and taking off regular lower body  clothing?: A Little 6 Click Score: 19    End of Session Equipment Utilized During Treatment: Oxygen  OT Visit Diagnosis: Other abnormalities of gait and mobility (R26.89);Muscle weakness (generalized) (M62.81)   Activity Tolerance Patient limited by fatigue   Patient Left in chair;with call bell/phone within reach;with chair alarm set   Nurse Communication Mobility status        Time: 1105-1120 OT Time Calculation (min): 15 min  Charges: OT General Charges $OT Visit: 1 Visit OT Treatments $Self Care/Home Management : 8-22 mins  Jolaine Artist, [place] [phone]   Delight Stare 06/02/2022, 1:17 PM

## 2022-06-02 NOTE — Progress Notes (Addendum)
Pt on 15L O2 via venturi mask this morning at 55% at a.m. change of shift.  Weaned down to 8L on venturi mask with O2 sats remaining > 90%.  Attempted to change to [place] unsuccessfully, pt sats fell rapidly to 70s with O2 at 15L via [place].  Venturi mask replaced at 10L spO2 back up to 93%.

## 2022-06-02 NOTE — Progress Notes (Signed)
[facility] Progress Note  Patient: Veronica Stone     NID:[id number]  DOA: 06/10/2022   PCP: Deland Pretty, MD       Brief hospital course: This is an 89 year old female with pulmonary fibrosis and chronic respiratory failure on 4 L of oxygen, rheumatoid arthritis, gastric ulcer, abdominal aortic aneurysm, chronic diastolic heart failure, essential hypertension, DVT who presented to the hospital for shortness of breath for 2 weeks.  Pulse ox 80% in the ED.  CTA revealed bilateral PE and bilateral groundglass opacities.  Started on IV aspirin and subsequently developed a lower GI bleed.  GI consulted and EGD and colonoscopy revealed nonbleeding gastric ulcer with a clean base and a malignant tumor in the sigmoid colon which was biopsied.  Biopsy revealed adenocarcinoma.  The patient continued to have lower GI bleeding and heparin was discontinued.  Despite discontinuing this she continued to have bleeding.  Which eventually stopped.  Radiation oncology was consulted along with med unk and the plan for radiation was discussed.  On 6/15 she first developed sudden onset shortness of breath and worsening hypoxia.  Temperature was 102.7.  She was started on broad-spectrum antibiotics and received a dose of IV Lasix.  There was a concern for aspiration pneumonia.  Subjective:  She states that she had a relatively good night.  She has absolutely no appetite this morning and is upset that people have been forcing her to try to eat.  At rest in the bed, she has no dyspnea today.  She has no other complaints.  Assessment and Plan: Principal Problem:   Acute on chronic respiratory failure with hypoxia -chronic diastolic heart failure (A) pulmonary fibrosis and chronically on 4 L of oxygen (B) acute pulmonary emboli-unable to give heparin due to lower GI bleed-IVC filter placed on 1/15  1/15> - hypoxic with p ox in 60s on 10 L O2, fever 102.7- aspiration during IVC filter? -  Chest x-ray was  unchanged from prior - IV Lasix given but is not being continued -started on vancomycin, ceftriaxone and metronidazole - pro calcitonin negative  1/17>  55% FiO2 with face mask has an elevated respiratory rate  -  resp viral panel,COVID and influenza neg - change prednisone to solumedrol 1/18> CXR sill unchanged-  weaned to 10 L face mask- up in a chair- less tachypnea- dc Vanc, Rocephin Flagyl- cont solumedrol today   Active Problems: Poor oral intake - no appetite- following fluid status closely  Adenocarcinoma of sigmoid colon - Evaluated by Dr. Cedric Fishman a candidate for surgical resection because of comorbidities - evaluated by rad onc -plan is for radiation of the mass which will need to be postponed until her respiratory status improves -  no recurrence of rectal bleed   Rheumatoid arthritis - On Rituxan as outpt - on daily Prednisone 10 mg as outpt - now on Solumedrol  Nausea-gastric ulcer - 10 mm gastric ulcer noted on 1/14 on EGD - Continue Protonix twice daily - Zofran was ineffective-will DC Zofran and try Phenergan - Nausea resolved but no appetite - cont Protonix  History of anxiety - Continue Xanax 3 times daily as needed  DNR       Code Status: DNR Level of Care: Level of care: Progressive Total time on patient care: 45 minutes DVT prophylaxis:  Place and maintain sequential compression device Start: 06/01/22 1241SCDs     Objective:   Vitals:   06/02/22 0311 06/02/22 0400 06/02/22 0750 06/02/22 1200  BP: 109/72 108/70 122/75 121/74  Pulse: 81 83 85 94  Resp: (!) 22 20 (!) 22 (!) 23  Temp:   98.5 F (36.9 C)   TempSrc:   Oral   SpO2: 94% 95% 94% 95%  Weight:      Height:       Filed Weights   06/15/2022 1917  Weight: 79.8 kg   Exam: General exam: Appears comfortable  HEENT: oral mucosa dry Respiratory system:  b/l crackles- breathing slower today- no distress  Cardiovascular system: S1 & S2 heard  Gastrointestinal system: Abdomen soft,  non-tender, nondistended. Normal bowel sounds   Extremities: No cyanosis, clubbing or edema Psychiatry:  flat affect     CBC: Recent Labs  Lab 06/15/2022 0110 05/30/22 0058 05/31/22 0555 06/01/22 0126 06/02/22 0113  WBC 12.8* 19.9* 21.1* 4.9 16.3*  HGB 12.5 14.3 13.6 13.9 13.2  HCT 37.6 45.5 43.1 44.2 40.5  MCV 87.9 90.8 88.5 85.2 86.7  PLT 245 216 184 155 655    Basic Metabolic Panel: Recent Labs  Lab 05/27/22 1033 05/28/2022 0110 05/31/22 0950 06/01/22 0126 06/02/22 0113  NA 142 143 139 136 139  K 3.3* 4.5 3.5 3.6 4.2  CL 108 114* 101 96* 103  CO2 '24 22 24 28 23  '$ GLUCOSE 104* 134* 81 216* 134*  BUN '16 12 9 10 16  '$ CREATININE 1.04* 0.93 1.11* 0.73 1.11*  CALCIUM 8.2* 7.9* 7.7* 8.7* 8.0*    GFR: Estimated Creatinine Clearance: 35.1 mL/min (A) (by C-G formula based on SCr of 1.11 mg/dL (H)).  Scheduled Meds:  methylPREDNISolone (SOLU-MEDROL) injection  40 mg Intravenous Q12H   pantoprazole (PROTONIX) IV  40 mg Intravenous Q12H   Continuous Infusions:  cefTRIAXone (ROCEPHIN)  IV 2 g (06/02/22 0817)   metronidazole 500 mg (06/02/22 0806)   promethazine (PHENERGAN) injection (IM or IVPB) Stopped (06/01/22 1315)   vancomycin Stopped (06/01/22 1133)   Imaging and lab data was personally reviewed DG Chest Port 1 View  Result Date: 06/02/2022 CLINICAL DATA:  [id number] Hypoxia [id number] EXAM: PORTABLE CHEST 1 VIEW COMPARISON:  05/30/2022 chest radiograph. FINDINGS: Stable cardiomediastinal silhouette with heart size. No pneumothorax. No pleural effusion. Extensive patchy confluent coarse reticular and hazy opacities throughout both lungs, unchanged. No acute consolidative airspace disease. IMPRESSION: Stable extensive patchy confluent coarse reticular and hazy opacities throughout both lungs, compatible with chronic fibrotic interstitial lung disease. No convincing superimposed acute cardiopulmonary process. Electronically Signed   By: Ilona Sorrel M.D.   On: 06/02/2022 08:25     LOS: 9 days   Author: Debbe Odea  06/02/2022 12:17 PM  To contact [facility]>   Check the care team in [facility] and look for the attending/consulting [place] provider listed  Log into [email or web address] and use [place]'s universal password   Go to> "[facility]"  and find provider  If you still have difficulty reaching the provider, please page the [facility] (Director on Call) for the Hospitalists listed on amion

## 2022-06-02 NOTE — Progress Notes (Signed)
Patient ID: Veronica Stone, female   DOB: 18-Jun-1931, 89 y.o.   MRN: [id number]    Progress Note from the Palliative Medicine Team at [facility]   Patient Name: Veronica Stone        Date: 06/02/2022 DOB: [birth year]  Age: 89 y.o. MRN#: [id number] Attending Physician: Debbe Odea, MD Primary Care Physician: Deland Pretty, MD Admit Date: 06/12/2022   Medical records reviewed, assessed patient   89 y.o. female   admitted on 05/27/2022 with   past medical history significant for pulmonary fibrosis on 4 L of oxygen at home on steroids, this is probably due to rheumatoid arthritis for which she is on Remicade, history of AAA, gastric ulcer, history of DVT comes in with shortness of breath that started about 2 weeks prior to admission cyanotic on arrival satting 80% in the ED placed on 12 L of oxygen, CT angio of the chest showed bilateral PE, new patchy groundglass opacity bilaterally.     Started on IV heparin and started having lower GI bleed, GI was consulted for EGD and colonoscopy performed.   EGD that showed nonbleeding gastric ulcer with clean base biopsies were taken.   Colonoscopy was done that showed malignant tumor of the sigmoid.   Biopsy-proven adenocarcinoma.  She is having ongoing bleeding without IV heparin, hematology was consulted recommended radiation therapy.     Acute on chronic respiratory failure with hypoxia.   Patient and family face treatment option decisions, advanced directive decisions and anticipatory care needs.   This NP assessed patient at the bedside as a follow up for palliative medicine needs and emotional support. I met at the bedside with patient's daughter/Veronica Stone.  Veronica Stone is out of bed to the chair, she is alert and oriented and "feels a little bit better today"    Unfortunately attempts by nursing  to wean oxygen below 8 L/venturi mask  resulted in rapid desaturation into the 70s.  Continued education regarding current medical situation and  pending treatment option decisions, advanced directive decisions and anticipatory care needs.  Education offered on the difference between an aggressive medical intervention path and a palliative comfort path for this patient, at this time in this situation.   Education offered on hospice benefit; philosophy and eligibility.  Hospice benefit detailed specific to in-home hospice services.  Both patient and her family understand the seriousness of her current medical situation.  However at this time they remain hopeful for improvement.    Ultimately patient would like to discharge home with the support of her family as they make decisions regarding treatment options specific to radiation for newly diagnosed sigmoid tumor.    Education offered today regarding  the importance of continued conversation with family and their  medical providers regarding overall plan of care and treatment options,  ensuring decisions are within the context of the patients values and GOCs.  Throughout our conversations over the last few days Veronica Stone verbalizes that  discharge home is a major priority, she speaks to her desire and "ready to go home', she is referencing her heavenly home.  Her family also  speak to her "readiness" and acceptance of her mortality.    Questions and concerns addressed   Discussed with Dr Wynelle Cleveland   PMT will continue to support holistically  50 minutes   Wadie Lessen NP  Palliative Medicine Team Team Phone # [phone] Pager [phone]

## 2022-06-02 NOTE — Progress Notes (Signed)
Physical Therapy Treatment Patient Details Name: Veronica Stone MRN: [id number] DOB: 09/05/1931 Today's Date: 06/02/2022   History of Present Illness 89 y.o. female admitted 05/21/2022 with SOB, chills, nausea/vomiting. Workup for bilateral PE, BLE DVTs, possible sepsis. S/p EGD and colonoscopy 1/14 which showed non-bleeding gastric ulcer; malignant tumor of the sigmoid tattooed 10 mm polyp was removed biopsies were taken. Per oncology, not a surgical candidate. S/p IVC filter placement 1/15. PMH includes HTN, RA, COPD (4L O2 baseline), pulmonary fibrosis, AAA, DVT, HLD, gastric ulcer.    PT Comments    Pt was seen for bed level visit as she was quite tired and had just returned to bed in last hour prior to PT arrival.  Pt was assisted to do there exercise for strengthening and stretching LE's, but does require some redirection for successful completion.  Pt is attended by several family members who are apparently very well versed in how she is doing and seem to regard her as at baseline mentation or very close.  Her plan is to go to rehab for strengthening, which pt hopes leads to return home in a timely way.  Follow acutely as her goals of PT are set up.   Recommendations for follow up therapy are one component of a multi-disciplinary discharge planning process, led by the attending physician.  Recommendations may be updated based on patient status, additional functional criteria and insurance authorization.  Follow Up Recommendations  Skilled nursing-short term rehab (<3 hours/day) Can patient physically be transported by private vehicle: No   Assistance Recommended at Discharge Frequent or constant Supervision/Assistance  Patient can return home with the following A lot of help with walking and/or transfers;A lot of help with bathing/dressing/bathroom;Assistance with cooking/housework;Direct supervision/assist for medications management;Direct supervision/assist for financial management;Assist  for transportation;Help with stairs or ramp for entrance   Equipment Recommendations  Wheelchair (measurements PT);Wheelchair cushion (measurements PT);Hospital bed    Recommendations for Other Services       Precautions / Restrictions Precautions Precautions: Fall;Other (comment) Precaution Comments: watch SpO2 (on venturi mask) Restrictions Weight Bearing Restrictions: No     Mobility  Bed Mobility Overal bed mobility: Needs Assistance             General bed mobility comments: declined OOB but repositioned laterally with mod assist    Transfers                   General transfer comment: declined    Ambulation/Gait               General Gait Details: not attempted   Stairs             Wheelchair Mobility    Modified Rankin (Stroke Patients Only)       Balance                                            Cognition Arousal/Alertness: Awake/alert Behavior During Therapy: Flat affect Overall Cognitive Status: Difficult to assess Area of Impairment: Memory, Following commands, Safety/judgement, Awareness, Problem solving                     Memory: Decreased short-term memory Following Commands: Follows one step commands with increased time Safety/Judgement: Decreased awareness of safety, Decreased awareness of deficits Awareness: Intellectual Problem Solving: Slow processing, Requires verbal cues, Requires tactile cues General Comments: discussed her poor appetite  and disinterest in rehab placement        Exercises General Exercises - Lower Extremity Ankle Circles/Pumps: AAROM, AROM, 5 reps Heel Slides: AROM, AAROM, 10 reps Hip ABduction/ADduction: AROM, AAROM, 10 reps Straight Leg Raises: AAROM, 10 reps    General Comments General comments (skin integrity, edema, etc.): pt is on 10 L O2 when PT arrives on venturi mask, pt and family report she cannot stay in acceptable O2 sat range without mask       Pertinent Vitals/Pain Pain Assessment Pain Assessment: Faces Faces Pain Scale: Hurts a little bit Pain Location: upper body Pain Descriptors / Indicators: Guarding Pain Intervention(s): Monitored during session, Repositioned    Home Living                          Prior Function            PT Goals (current goals can now be found in the care plan section) Acute Rehab PT Goals Patient Stated Goal: to go home Progress towards PT goals: Progressing toward goals    Frequency    Min 3X/week      PT Plan Current plan remains appropriate    Co-evaluation              AM-PAC PT "6 Clicks" Mobility   Outcome Measure  Help needed turning from your back to your side while in a flat bed without using bedrails?: A Lot Help needed moving from lying on your back to sitting on the side of a flat bed without using bedrails?: A Lot Help needed moving to and from a bed to a chair (including a wheelchair)?: A Lot Help needed standing up from a chair using your arms (e.g., wheelchair or bedside chair)?: A Lot Help needed to walk in hospital room?: Total Help needed climbing 3-5 steps with a railing? : Total 6 Click Score: 10    End of Session Equipment Utilized During Treatment: Oxygen Activity Tolerance: Patient limited by fatigue;Treatment limited secondary to medical complications (Comment) Patient left: in bed;with call bell/phone within reach;with bed alarm set;with family/visitor present Nurse Communication: Mobility status PT Visit Diagnosis: Unsteadiness on feet (R26.81);Muscle weakness (generalized) (M62.81);Difficulty in walking, not elsewhere classified (R26.2)     Time: 1610-9604 PT Time Calculation (min) (ACUTE ONLY): 10 min  Charges:  $Therapeutic Exercise: 8-22 mins  Ramond Dial 06/02/2022, 4:19 PM  Mee Hives, PT PhD Acute Rehab Dept. Number: [place] and [place]

## 2022-06-03 DIAGNOSIS — C189 Malignant neoplasm of colon, unspecified: Secondary | ICD-10-CM

## 2022-06-03 DIAGNOSIS — L8990 Pressure ulcer of unspecified site, unspecified stage: Secondary | ICD-10-CM | POA: Insufficient documentation

## 2022-06-03 DIAGNOSIS — Z515 Encounter for palliative care: Secondary | ICD-10-CM | POA: Diagnosis not present

## 2022-06-03 DIAGNOSIS — Z95828 Presence of other vascular implants and grafts: Secondary | ICD-10-CM

## 2022-06-03 DIAGNOSIS — J9621 Acute and chronic respiratory failure with hypoxia: Secondary | ICD-10-CM | POA: Diagnosis not present

## 2022-06-03 LAB — CBC
HCT: 40 % (ref 36.0–46.0)
Hemoglobin: 13.5 g/dL (ref 12.0–15.0)
MCH: 28.8 pg (ref 26.0–34.0)
MCHC: 33.8 g/dL (ref 30.0–36.0)
MCV: 85.5 fL (ref 80.0–100.0)
Platelets: 279 10*3/uL (ref 150–400)
RBC: 4.68 MIL/uL (ref 3.87–5.11)
RDW: 14 % (ref 11.5–15.5)
WBC: 19.8 10*3/uL — ABNORMAL HIGH (ref 4.0–10.5)
nRBC: 0.1 % (ref 0.0–0.2)

## 2022-06-03 LAB — BASIC METABOLIC PANEL
Anion gap: 12 (ref 5–15)
BUN: 26 mg/dL — ABNORMAL HIGH (ref 8–23)
CO2: 26 mmol/L (ref 22–32)
Calcium: 8.3 mg/dL — ABNORMAL LOW (ref 8.9–10.3)
Chloride: 103 mmol/L (ref 98–111)
Creatinine, Ser: 1.02 mg/dL — ABNORMAL HIGH (ref 0.44–1.00)
GFR, Estimated: 53 mL/min — ABNORMAL LOW (ref >60)
Glucose, Bld: 159 mg/dL — ABNORMAL HIGH (ref 70–99)
Potassium: 3.7 mmol/L (ref 3.5–5.1)
Sodium: 141 mmol/L (ref 135–145)

## 2022-06-03 MED ORDER — GUAIFENESIN-DM 100-10 MG/5ML PO SYRP: 10.0000 mL | ORAL_SOLUTION | ORAL | Status: DC

## 2022-06-03 MED ORDER — GUAIFENESIN-DM 100-10 MG/5ML PO SYRP
10.0000 mL | ORAL_SOLUTION | ORAL | Status: DC
  Administered 2022-06-03 – 2022-06-09 (×18): 10 mL via ORAL
  Filled 2022-06-03 (×17): qty 10

## 2022-06-03 MED ORDER — LORAZEPAM 2 MG/ML IJ SOLN
1.0000 mg | INTRAMUSCULAR | Status: DC | PRN
  Administered 2022-06-11: 1 mg via INTRAVENOUS
  Filled 2022-06-03: qty 1

## 2022-06-03 MED ORDER — MORPHINE SULFATE (PF) 2 MG/ML IV SOLN
2.0000 mg | INTRAVENOUS | Status: DC | PRN
  Administered 2022-06-04 – 2022-06-10 (×7): 2 mg via INTRAVENOUS
  Filled 2022-06-03 (×8): qty 1

## 2022-06-03 NOTE — Progress Notes (Signed)
PT Cancellation Note  Patient Details Name: Veronica Stone MRN: [id number] DOB: February 14, 1932   Cancelled Treatment:    Reason Eval/Treat Not Completed: Patient declined, stated she didn't sleep and she isn't going to work with therapy today.  Shary Decamp [facility] 06/03/2022, 12:25 PM [place] Office [phone]

## 2022-06-03 NOTE — IPAL (Signed)
  Interdisciplinary Goals of Care Family Meeting   Date carried out: 06/03/2022  Location of the meeting: Bedside  Member's involved: Physician and Other: patient and daughter Veronica Stone  Durable Power of Attorney or acting medical decision maker: patient is making decisions    Discussion: We discussed goals of care for Veronica Stone .  Veronica Stone is awake alert and oriented and is making decisions for herself in the presence of [name].  She states that she would like to pursue comfort measures, stop steroids, continue medicines for anxiety, cough and, only if respiratory distress should occur, which she take IV morphine.  She would like to keep the oxygen on and would like to be able to pass away in her sleep like her sister did.  She will not be transferring to hospice home as the hospice home in [place] is currently closed.  She understands that she will not be able to go back to her own home as her oxygen requirements remain elevated.  [name] is in agreement with her mother's plan.  I have explained that the patient will stay in this room unless of course it is required for another patient vital signs will only be done on a as needed basis and telemetry will be removed.  I have updated the patient's nurse, Linh on these changes.  Code status:   Code Status: DNR   Disposition: In-patient comfort care  Time spent for the meeting: 40 min    Debbe Odea, MD  06/03/2022, 1:19 PM

## 2022-06-03 NOTE — Progress Notes (Signed)
[facility] Progress Note  Patient: Veronica Stone     KYH:[id number]  DOA: 06/09/2022   PCP: Deland Pretty, MD       Brief hospital course: This is an 89 year old female with pulmonary fibrosis and chronic respiratory failure on 4 L of oxygen, rheumatoid arthritis, gastric ulcer, abdominal aortic aneurysm, chronic diastolic heart failure, essential hypertension, DVT who presented to the hospital for shortness of breath for 2 weeks.  Pulse ox 80% in the ED.  CTA revealed bilateral PE and bilateral groundglass opacities.  Started on IV aspirin and subsequently developed a lower GI bleed.  GI consulted and EGD and colonoscopy revealed nonbleeding gastric ulcer with a clean base and a malignant tumor in the sigmoid colon which was biopsied.  Biopsy revealed adenocarcinoma.  The patient continued to have lower GI bleeding and heparin was discontinued.  Despite discontinuing this she continued to have bleeding.  Which eventually stopped.  Radiation oncology was consulted along with med unk and the plan for radiation was discussed.  On 6/15 she first developed sudden onset shortness of breath and worsening hypoxia.  Temperature was 102.7.  She was started on broad-spectrum antibiotics and received a dose of IV Lasix.  There was a concern for aspiration pneumonia. She did not improve with antibiotics or IV steroids. Her oral intake remained poor. She expressed a desire for comfort care repeatedly.   Assessment and Plan:  Veronica Stone has transitioned to comfort care after my discussion with her and [name] today. Please see separate iPAL note.   Principal Problem:   Acute on chronic respiratory failure with hypoxia (HCC) Active Problems:   Colon cancer with bleeding   Essential hypertension   AAA (abdominal aortic aneurysm) without rupture (HCC)   Rheumatoid arthritis (HCC)   Pulmonary fibrosis (HCC)   Physical deconditioning   Pulmonary embolism without acute cor  pulmonale (HCC)   History of rheumatoid arthritis   Dehydration   Elevated lactic acid level   Elevated troponin   Pressure injury of skin   Presence of IVC filter        Code Status: DNR Level of Care: Level of care: Progressive Total time on patient care: 45 minutes DVT prophylaxis:  SCDs     Objective:   Vitals:   06/02/22 2341 06/03/22 0356 06/03/22 0858 06/03/22 1300  BP: 119/82 133/86 129/76 133/75  Pulse: 93 89 87 96  Resp: '19 16 20 20  '$ Temp: 97.9 F (36.6 C) 97.8 F (36.6 C) 97.9 F (36.6 C) 98.7 F (37.1 C)  TempSrc: Oral Axillary Oral Oral  SpO2: 93% 94% 95% 91%  Weight:      Height:       Filed Weights   05/23/2022 1917  Weight: 79.8 kg   Exam: General exam: Appears comfortable  HEENT: oral mucosa dry Respiratory system:  b/l crackles- breathing slower today- no distress  Cardiovascular system: S1 & S2 heard  Gastrointestinal system: Abdomen soft, non-tender, nondistended. Normal bowel sounds   Extremities: No cyanosis, clubbing or edema Psychiatry:  flat affect     CBC: Recent Labs  Lab 05/30/22 0058 05/31/22 0555 06/01/22 0126 06/02/22 0113 06/03/22 0243  WBC 19.9* 21.1* 4.9 16.3* 19.8*  HGB 14.3 13.6 13.9 13.2 13.5  HCT 45.5 43.1 44.2 40.5 40.0  MCV 90.8 88.5 85.2 86.7 85.5  PLT 216 184 155 234 151   Basic Metabolic Panel: Recent Labs  Lab 05/26/2022 0110 05/31/22 0950 06/01/22 0126 06/02/22 0113 06/03/22 0243  NA 143  139 136 139 141  K 4.5 3.5 3.6 4.2 3.7  CL 114* 101 96* 103 103  CO2 '22 24 28 23 26  '$ GLUCOSE 134* 81 216* 134* 159*  BUN '12 9 10 16 '$ 26*  CREATININE 0.93 1.11* 0.73 1.11* 1.02*  CALCIUM 7.9* 7.7* 8.7* 8.0* 8.3*   GFR: Estimated Creatinine Clearance: 38.2 mL/min (A) (by C-G formula based on SCr of 1.02 mg/dL (H)).  Scheduled Meds:  guaiFENesin-dextromethorphan  10 mL Oral Q4H   Continuous Infusions:  promethazine (PHENERGAN) injection (IM or IVPB) Stopped (06/01/22 1315)   Imaging and lab data was  personally reviewed DG Chest Port 1 View  Result Date: 06/02/2022 CLINICAL DATA:  [id number] Hypoxia [id number] EXAM: PORTABLE CHEST 1 VIEW COMPARISON:  05/30/2022 chest radiograph. FINDINGS: Stable cardiomediastinal silhouette with heart size. No pneumothorax. No pleural effusion. Extensive patchy confluent coarse reticular and hazy opacities throughout both lungs, unchanged. No acute consolidative airspace disease. IMPRESSION: Stable extensive patchy confluent coarse reticular and hazy opacities throughout both lungs, compatible with chronic fibrotic interstitial lung disease. No convincing superimposed acute cardiopulmonary process. Electronically Signed   By: Ilona Sorrel M.D.   On: 06/02/2022 08:25    LOS: 10 days   Author: Eunice Blase Tallen Schnorr  06/03/2022 1:30 PM  To contact [facility]>   Check the care team in [facility] and look for the attending/consulting [place] provider listed  Log into [email or web address] and use [place]'s universal password   Go to> "[facility]"  and find provider  If you still have difficulty reaching the provider, please page the [facility] (Director on Call) for the Hospitalists listed on amion

## 2022-06-03 NOTE — Progress Notes (Signed)
Daily Progress Note   Patient Name: Veronica Stone      Date: 06/03/2022 DOB: 01-30-1932  Age: 89 y.o. MRN#: [id number] Attending Physician: Debbe Odea, MD Primary Care Physician: Deland Pretty, MD Admit Date: 05/31/2022  Reason for Consultation/Follow-up: Establishing goals of care  HPI/Brief Hospital Review: 89 y.o. female admitted on 05/27/2022 with past medical history significant for pulmonary fibrosis on 4 L of oxygen at home, chronic steroid use due to RA for which she is on Remicade, history of AAA, gastric ulcer, history of DVT comes in c/o shortness of breath that started about 2 weeks prior to admission, cyanotic on arrival with saturations around 80% in the ED placed on 12 L of oxygen, CT angio of the chest showed bilateral PE, new patchy groundglass opacity bilaterally.     Started on IV heparin resulting in lower GI bleed, GI was consulted for EGD and colonoscopy.   EGD revealed nonbleeding gastric ulcer; clean base biopsies were taken.   Colonoscopy was done that showed malignant tumor of the sigmoid.   Biopsy-proven adenocarcinoma.  She is having ongoing bleeding without IV heparin, hematology was consulted recommending radiation therapy.     Acute on chronic respiratory failure with hypoxia.  Palliative Medicine consulted for assisting with ongoing goals of care conversations.  Subjective: Extensive chart review has been completed prior to meeting patient including labs, vital signs, imaging, progress notes, orders, and available advanced directive documents from current and previous encounters.    Visited Veronica Stone at her bedside. Daughter/Linda at bedside as well during visit. Dr. Wynelle Cleveland also present during visit-explains she has placed orders to transition Veronica Stone  to comfort care status. Veronica Stone and Veronica Stone express desire to transition to comfort care. Questions and concerns addressed regarding transition. Veronica Stone is adamant about remaining in hospital during this time-declines hospice services.  PMT to continue to provide ongoing support.  Objective:       Vital Signs: BP 133/75 (BP Location: Left Arm)   Pulse 96   Temp 98.7 F (37.1 C) (Oral)   Resp 20   Ht '5\' 3"'$  (1.6 m)   Wt 79.8 kg   SpO2 91%   BMI 31.16 kg/m  SpO2: SpO2: 91 % O2 Device: O2 Device: Venturi Mask O2 Flow Rate: O2 Flow Rate (L/min): 10 L/min   Palliative  Care Assessment & Plan   Assessment/Recommendation/Plan  DNR Transitioned to [place] by primary team PMT to continue to follow for ongoing needs and support  Thank you for allowing the Palliative Medicine Team to assist in the care of this patient.  Total time:  25 minutes  Greater than 50%  of this time was spent counseling and coordinating care related to the above assessment and plan.  Theodoro Grist, DNP, AGNP-C Palliative Medicine   Please contact Palliative Medicine Team phone at [phone] for questions and concerns.

## 2022-06-03 NOTE — Progress Notes (Signed)
Mobility Specialist - Progress Note   06/03/22 1447  Mobility  Activity Refused mobility   Pt refused mobility. No specific reason given. Will follow up.   Franki Monte  Mobility Specialist Please contact via [employer] or Rehab office at [phone]

## 2022-06-04 DIAGNOSIS — J9621 Acute and chronic respiratory failure with hypoxia: Secondary | ICD-10-CM | POA: Diagnosis not present

## 2022-06-04 DIAGNOSIS — Z515 Encounter for palliative care: Secondary | ICD-10-CM | POA: Diagnosis not present

## 2022-06-04 LAB — CULTURE, BLOOD (ROUTINE X 2)
Culture: NO GROWTH
Culture: NO GROWTH
Special Requests: ADEQUATE

## 2022-06-04 NOTE — Progress Notes (Addendum)
Mobility Specialist Progress Note:   06/04/22 0945  Mobility  Activity Transferred to/from [facility]  Level of Assistance Minimal assist, patient does 75% or more  Assistive Device Front wheel walker;BSC  Distance Ambulated (ft) 4 ft  Activity Response Tolerated well  $Mobility charge 1 Mobility   Pt received in bed asking to use BSC. No complaints of pain. MinA to stand then contact guard to get to [facility], Pt able to have loose BM and required peri-care. Left in bed with call bell in reach and all needs met.   Gareth Eagle Earlene Bjelland Mobility Specialist Please contact via [employer] or  Rehab Office at [phone]

## 2022-06-04 NOTE — Progress Notes (Signed)
[facility] Progress Note  Patient: Veronica Stone     LAG:[id number]  DOA: 06/09/2022   PCP: Deland Pretty, MD       Brief hospital course: This is an 89 year old female with pulmonary fibrosis and chronic respiratory failure on 4 L of oxygen, rheumatoid arthritis, gastric ulcer, abdominal aortic aneurysm, chronic diastolic heart failure, essential hypertension, DVT who presented to the hospital for shortness of breath for 2 weeks.  Pulse ox 80% in the ED.  CTA revealed bilateral PE and bilateral groundglass opacities.  Started on IV aspirin and subsequently developed a lower GI bleed.  GI consulted and EGD and colonoscopy revealed nonbleeding gastric ulcer with a clean base and a malignant tumor in the sigmoid colon which was biopsied.  Biopsy revealed adenocarcinoma.  The patient continued to have lower GI bleeding and heparin was discontinued.  Despite discontinuing this she continued to have bleeding.  Which eventually stopped.  Radiation oncology was consulted along with med unk and the plan for radiation was discussed.  On 6/15 she first developed sudden onset shortness of breath and worsening hypoxia.  Temperature was 102.7.  She was started on broad-spectrum antibiotics and received a dose of IV Lasix.  There was a concern for aspiration pneumonia. She did not improve with antibiotics or IV steroids. Her oral intake remained poor. She expressed a desire for comfort care repeatedly.   Assessment and Plan:  [name] has transitioned to comfort care yesterday after my discussion with her and [name].   She is sleeping comfortably in the bed on exam today.   Principal Problem:   Acute on chronic respiratory failure with hypoxia (HCC) Active Problems:   Colon cancer with bleeding   Essential hypertension   AAA (abdominal aortic aneurysm) without rupture (HCC)   Rheumatoid arthritis (HCC)   Pulmonary fibrosis (HCC)   Physical deconditioning   Pulmonary  embolism without acute cor pulmonale (HCC)   History of rheumatoid arthritis   Dehydration   Elevated lactic acid level   Elevated troponin   Pressure injury of skin   Presence of IVC filter        Code Status: DNR Level of Care: Level of care: Progressive Total time on patient care: 25 minutes       Objective:   Vitals:   06/03/22 2324 06/04/22 0300 06/04/22 0552 06/04/22 0856  BP:    127/78  Pulse:   98 93  Resp:   (!) 25 (!) 25  Temp:    98.1 F (36.7 C)  TempSrc:    Axillary  SpO2: (!) 89% 93% 90% 93%  Weight:      Height:       Filed Weights   06/10/2022 1917  Weight: 79.8 kg   CBC: Recent Labs  Lab 05/30/22 0058 05/31/22 0555 06/01/22 0126 06/02/22 0113 06/03/22 0243  WBC 19.9* 21.1* 4.9 16.3* 19.8*  HGB 14.3 13.6 13.9 13.2 13.5  HCT 45.5 43.1 44.2 40.5 40.0  MCV 90.8 88.5 85.2 86.7 85.5  PLT 216 184 155 234 122    Basic Metabolic Panel: Recent Labs  Lab 06/03/2022 0110 05/31/22 0950 06/01/22 0126 06/02/22 0113 06/03/22 0243  NA 143 139 136 139 141  K 4.5 3.5 3.6 4.2 3.7  CL 114* 101 96* 103 103  CO2 '22 24 28 23 26  '$ GLUCOSE 134* 81 216* 134* 159*  BUN '12 9 10 16 '$ 26*  CREATININE 0.93 1.11* 0.73 1.11* 1.02*  CALCIUM 7.9* 7.7* 8.7* 8.0* 8.3*  GFR: Estimated Creatinine Clearance: 38.2 mL/min (A) (by C-G formula based on SCr of 1.02 mg/dL (H)).  Scheduled Meds:  guaiFENesin-dextromethorphan  10 mL Oral Q4H   Continuous Infusions:  promethazine (PHENERGAN) injection (IM or IVPB) Stopped (06/01/22 1315)   Imaging and lab data was personally reviewed No results found.  LOS: 11 days   Author: Debbe Odea  06/04/2022 9:36 AM  To contact [facility]>   Check the care team in [facility] and look for the attending/consulting [facility] provider listed  Log into [email or web address] and use [place]'s universal password   Go to> "[facility]"  and find provider  If you still have difficulty reaching the provider, please page the [facility]  (Director on Call) for the Hospitalists listed on amion

## 2022-06-05 DIAGNOSIS — Z515 Encounter for palliative care: Secondary | ICD-10-CM | POA: Diagnosis not present

## 2022-06-05 DIAGNOSIS — J9621 Acute and chronic respiratory failure with hypoxia: Secondary | ICD-10-CM | POA: Diagnosis not present

## 2022-06-05 MED ORDER — CHLORHEXIDINE GLUCONATE CLOTH 2 % EX PADS
6.0000 | MEDICATED_PAD | Freq: Every day | CUTANEOUS | Status: DC
  Administered 2022-06-05 – 2022-06-07 (×2): 6 via TOPICAL

## 2022-06-05 NOTE — Progress Notes (Signed)
[facility] Progress Note  Patient: Veronica Stone     HDQ:[id number]  DOA: 06/12/2022   PCP: Deland Pretty, MD       Brief hospital course: This is an 89 year old female with pulmonary fibrosis and chronic respiratory failure on 4 L of oxygen, rheumatoid arthritis, gastric ulcer, abdominal aortic aneurysm, chronic diastolic heart failure, essential hypertension, DVT who presented to the hospital for shortness of breath for 2 weeks.  Pulse ox 80% in the ED.  CTA revealed bilateral PE and bilateral groundglass opacities.  Started on IV aspirin and subsequently developed a lower GI bleed.  GI consulted and EGD and colonoscopy revealed nonbleeding gastric ulcer with a clean base and a malignant tumor in the sigmoid colon which was biopsied.  Biopsy revealed adenocarcinoma.  The patient continued to have lower GI bleeding and heparin was discontinued.  Despite discontinuing this she continued to have bleeding.  Which eventually stopped.  Radiation oncology was consulted along with med unk and the plan for radiation was discussed.  On 6/15 she first developed sudden onset shortness of breath and worsening hypoxia.  Temperature was 102.7.  She was started on broad-spectrum antibiotics and received a dose of IV Lasix.  There was a concern for aspiration pneumonia. She did not improve with antibiotics or IV steroids.  Subsequent to this event, her oral intake remained poor. She expressed a desire for comfort care repeatedly.  1/19> She was transitioned to comfort care after discussion with her and with her daughter, [name].  Principal Problem:   Acute on chronic respiratory failure with hypoxia (HCC) Active Problems:   Colon cancer with bleeding   Essential hypertension   AAA (abdominal aortic aneurysm) without rupture (HCC)   Rheumatoid arthritis (HCC)   Pulmonary fibrosis (HCC)   Physical deconditioning   Pulmonary embolism without acute cor pulmonale (HCC)   History of rheumatoid  arthritis   Dehydration   Elevated lactic acid level   Elevated troponin   Pressure injury of skin   Presence of IVC filter  Assessment and Plan: Continue comfort care measures- expect patient to expire in the hospital. [facility] place is closed for renovations.   Exam: continues to be sleeping comfortably     Code Status: DNR Level of Care: Level of care: Med-Surg Total time on patient care: 20 minutes      Objective:   Vitals:   06/04/22 1752 06/04/22 2022 06/05/22 0137 06/05/22 0532  BP: 130/77 131/81 129/72 137/75  Pulse: (!) 101 98 99 100  Resp: (!) '22 20 18 16  '$ Temp: 98.5 F (36.9 C) 98.2 F (36.8 C) 97.8 F (36.6 C) 98 F (36.7 C)  TempSrc: Axillary Axillary Oral Oral  SpO2: 96% 99% 95% 93%  Weight:      Height:       Filed Weights   06/14/2022 1917  Weight: 79.8 kg   CBC: Recent Labs  Lab 05/30/22 0058 05/31/22 0555 06/01/22 0126 06/02/22 0113 06/03/22 0243  WBC 19.9* 21.1* 4.9 16.3* 19.8*  HGB 14.3 13.6 13.9 13.2 13.5  HCT 45.5 43.1 44.2 40.5 40.0  MCV 90.8 88.5 85.2 86.7 85.5  PLT 216 184 155 234 119    Basic Metabolic Panel: Recent Labs  Lab 05/31/22 0950 06/01/22 0126 06/02/22 0113 06/03/22 0243  NA 139 136 139 141  K 3.5 3.6 4.2 3.7  CL 101 96* 103 103  CO2 '24 28 23 26  '$ GLUCOSE 81 216* 134* 159*  BUN '9 10 16 '$ 26*  CREATININE 1.11*  0.73 1.11* 1.02*  CALCIUM 7.7* 8.7* 8.0* 8.3*    GFR: Estimated Creatinine Clearance: 38.2 mL/min (A) (by C-G formula based on SCr of 1.02 mg/dL (H)).  Scheduled Meds:  Chlorhexidine Gluconate Cloth  6 each Topical Q0600   guaiFENesin-dextromethorphan  10 mL Oral Q4H   Continuous Infusions:  promethazine (PHENERGAN) injection (IM or IVPB) Stopped (06/01/22 1315)   Imaging and lab data was personally reviewed No results found.  LOS: 12 days   Author: Debbe Odea  06/05/2022 10:28 AM  To contact [facility]>   Check the care team in [facility] and look for the attending/consulting [place] provider  listed  Log into [email or web address] and use 's universal password   Go to> "[facility]"  and find provider  If you still have difficulty reaching the provider, please page the [facility] (Director on Call) for the Hospitalists listed on amion

## 2022-06-05 NOTE — Plan of Care (Signed)
  Problem: Fluid Volume: Goal: Hemodynamic stability will improve Outcome: Progressing   Problem: Clinical Measurements: Goal: Diagnostic test results will improve Outcome: Progressing Goal: Signs and symptoms of infection will decrease Outcome: Progressing   Problem: Respiratory: Goal: Ability to maintain adequate ventilation will improve Outcome: Progressing   Problem: Education: Goal: Knowledge of General Education information will improve Description: Including pain rating scale, medication(s)/side effects and non-pharmacologic comfort measures Outcome: Progressing   Problem: Health Behavior/Discharge Planning: Goal: Ability to manage health-related needs will improve Outcome: Progressing   Problem: Clinical Measurements: Goal: Ability to maintain clinical measurements within normal limits will improve Outcome: Progressing Goal: Will remain free from infection Outcome: Progressing Goal: Diagnostic test results will improve Outcome: Progressing Goal: Respiratory complications will improve Outcome: Progressing Goal: Cardiovascular complication will be avoided Outcome: Progressing   Problem: Activity: Goal: Risk for activity intolerance will decrease Outcome: Progressing   Problem: Nutrition: Goal: Adequate nutrition will be maintained Outcome: Progressing   Problem: Coping: Goal: Level of anxiety will decrease Outcome: Progressing   Problem: Elimination: Goal: Will not experience complications related to bowel motility Outcome: Progressing Goal: Will not experience complications related to urinary retention Outcome: Progressing   Problem: Pain Managment: Goal: General experience of comfort will improve Outcome: Progressing   Problem: Safety: Goal: Ability to remain free from injury will improve Outcome: Progressing   Problem: Skin Integrity: Goal: Risk for impaired skin integrity will decrease Outcome: Progressing   Problem: Education: Goal:  Knowledge of the prescribed therapeutic regimen will improve Outcome: Progressing   Problem: Coping: Goal: Ability to identify and develop effective coping behavior will improve Outcome: Progressing   Problem: Clinical Measurements: Goal: Quality of life will improve Outcome: Progressing   Problem: Respiratory: Goal: Verbalizations of increased ease of respirations will increase Outcome: Progressing   Problem: Role Relationship: Goal: Family's ability to cope with current situation will improve Outcome: Progressing Goal: Ability to verbalize concerns, feelings, and thoughts to partner or family member will improve Outcome: Progressing   Problem: Pain Management: Goal: Satisfaction with pain management regimen will improve Outcome: Progressing   

## 2022-06-06 DIAGNOSIS — Z515 Encounter for palliative care: Secondary | ICD-10-CM | POA: Diagnosis not present

## 2022-06-06 DIAGNOSIS — J8410 Pulmonary fibrosis, unspecified: Secondary | ICD-10-CM | POA: Diagnosis not present

## 2022-06-06 DIAGNOSIS — J9621 Acute and chronic respiratory failure with hypoxia: Secondary | ICD-10-CM | POA: Diagnosis not present

## 2022-06-06 NOTE — Progress Notes (Signed)
Daily Progress Note   Patient Name: Veronica Stone      Date: 06/06/2022 DOB: 1931-12-23  Age: 89 y.o. MRN#: [id number] Attending Physician: Debbe Odea, MD Primary Care Physician: Deland Pretty, MD Admit Date: 05/17/2022  Reason for Consultation/Follow-up: Establishing goals of care  HPI/Brief Hospital Review: 89 y.o. female admitted on 06/09/2022 with past medical history significant for pulmonary fibrosis on 4 L of oxygen at home, chronic steroid use due to RA for which she is on Remicade, history of AAA, gastric ulcer, history of DVT comes in c/o shortness of breath that started about 2 weeks prior to admission, cyanotic on arrival with saturations around 80% in the ED placed on 12 L of oxygen, CT angio of the chest showed bilateral PE, new patchy groundglass opacity bilaterally.     Started on IV heparin resulting in lower GI bleed, GI was consulted for EGD and colonoscopy.   EGD revealed nonbleeding gastric ulcer; clean base biopsies were taken.   Colonoscopy was done that showed malignant tumor of the sigmoid.   Biopsy-proven adenocarcinoma.  She is having ongoing bleeding without IV heparin, hematology was consulted recommending radiation therapy. Acute on chronic respiratory failure with hypoxia.   Palliative Medicine consulted for assisting with ongoing goals of care conversations.  Transitioned to Full Comfort Care 1/19 by primary medical team  Subjective: Extensive chart review has been completed prior to meeting patient including labs, vital signs, imaging, progress notes, orders, and available advanced directive documents from current and previous encounters.    Visited with Veronica Stone at her beside. Veronica Stone at bedside during time of visit. Veronica Stone concerned  when I entered the room--Veronica Stone had removed venti-mask and refusing to leave it on. Offered nasal cannula, Veronica Stone willing to wear [place], family appreciative. During transition, Veronica Stone with increased WOB and significant hypoxia, spoke with family about administering small dose of morphine to help alleviate WOB--adamantly declined--family as well as Veronica Stone. Provided education to Veronica Stone that morphine works well to help alleviate symptoms of increased WOB and SHOB. Further explained that with sustained hypoxia, [name] may not have capacity to make her decisions. Veronica Stone expressed understanding and will consider utilizing medication with continued WOB.  PMT to continue to follow for ongoing support and symptoms management.  Objective:  Physical Exam Constitutional:  General: She is not in acute distress.    Appearance: She is ill-appearing.  Pulmonary:     Effort: Tachypnea present. No respiratory distress.  Abdominal:     Palpations: Abdomen is soft.     Tenderness: There is no abdominal tenderness.  Skin:    General: Skin is warm and dry.             Vital Signs: BP 117/68 (BP Location: Left Arm)   Pulse 99   Temp 99.3 F (37.4 C) (Oral)   Resp (!) 24   Ht '5\' 3"'$  (1.6 m)   Wt 79.8 kg   SpO2 94%   BMI 31.16 kg/m  SpO2: SpO2: 94 % O2 Device: O2 Device: CPAP O2 Flow Rate: O2 Flow Rate (L/min): 5 L/min   Palliative Care Assessment & Plan   Assessment/Recommendation/Plan  Remains Full Comfort Family declines hospice support PMT to continue to provide ongoing support and assistance with symptom management  Thank you for allowing the Palliative Medicine Team to assist in the care of this patient.  Total time:  35 minutes  Greater than 50%  of this time was spent counseling and coordinating care related to the above assessment and plan.  Theodoro Grist, DNP, AGNP-C Palliative Medicine   Please contact Palliative Medicine Team phone  at [phone] for questions and concerns.

## 2022-06-06 NOTE — Progress Notes (Signed)
[facility] Progress Note  Patient: Veronica Stone     WIO:[id number]  DOA: 05/30/2022   PCP: Deland Pretty, MD       Brief hospital course: This is an 89 year old female with pulmonary fibrosis and chronic respiratory failure on 4 L of oxygen, rheumatoid arthritis, gastric ulcer, abdominal aortic aneurysm, chronic diastolic heart failure, essential hypertension, DVT who presented to the hospital for shortness of breath for 2 weeks.  Pulse ox 80% in the ED.  CTA revealed bilateral PE and bilateral groundglass opacities.  Started on IV aspirin and subsequently developed a lower GI bleed.  GI consulted and EGD and colonoscopy revealed nonbleeding gastric ulcer with a clean base and a malignant tumor in the sigmoid colon which was biopsied.  Biopsy revealed adenocarcinoma.  The patient continued to have lower GI bleeding and heparin was discontinued.  Despite discontinuing this she continued to have bleeding.  Which eventually stopped.  Radiation oncology was consulted along with med unk and the plan for radiation was discussed.  On 6/15 she first developed sudden onset shortness of breath and worsening hypoxia.  Temperature was 102.7.  She was started on broad-spectrum antibiotics and received a dose of IV Lasix.  There was a concern for aspiration pneumonia. She did not improve with antibiotics or IV steroids.  Subsequent to this event, her oral intake remained poor. She expressed a desire for comfort care repeatedly.  1/19> She was transitioned to comfort care after discussion with her and with her daughter, [name].  1/2- daughter at bedside- patient sleeping peacefully  Principal Problem:   Acute on chronic respiratory failure with hypoxia (HCC) Active Problems:   Colon cancer with bleeding   Essential hypertension   AAA (abdominal aortic aneurysm) without rupture (HCC)   Rheumatoid arthritis (HCC)   Pulmonary fibrosis (HCC)   Physical deconditioning   Pulmonary embolism  without acute cor pulmonale (HCC)   History of rheumatoid arthritis   Dehydration   Elevated lactic acid level   Elevated troponin   Pressure injury of skin   Presence of IVC filter  Assessment and Plan: Continue comfort care measures- expect patient to expire in the hospital. [facility] place is closed for renovations.   Exam: continues to be sleeping comfortably     Code Status: DNR Level of Care: Level of care: Med-Surg Total time on patient care: 20 minutes      Objective:   Vitals:   06/04/22 2022 06/05/22 0137 06/05/22 0532 06/05/22 2015  BP: 131/81 129/72 137/75 117/68  Pulse: 98 99 100 99  Resp: '20 18 16 '$ (!) 24  Temp: 98.2 F (36.8 C) 97.8 F (36.6 C) 98 F (36.7 C) 99.3 F (37.4 C)  TempSrc: Axillary Oral Oral Oral  SpO2: 99% 95% 93% 94%  Weight:      Height:       Filed Weights   06/07/2022 1917  Weight: 79.8 kg   CBC: Recent Labs  Lab 05/31/22 0555 06/01/22 0126 06/02/22 0113 06/03/22 0243  WBC 21.1* 4.9 16.3* 19.8*  HGB 13.6 13.9 13.2 13.5  HCT 43.1 44.2 40.5 40.0  MCV 88.5 85.2 86.7 85.5  PLT 184 155 234 426    Basic Metabolic Panel: Recent Labs  Lab 05/31/22 0950 06/01/22 0126 06/02/22 0113 06/03/22 0243  NA 139 136 139 141  K 3.5 3.6 4.2 3.7  CL 101 96* 103 103  CO2 '24 28 23 26  '$ GLUCOSE 81 216* 134* 159*  BUN '9 10 16 '$ 26*  CREATININE 1.11*  0.73 1.11* 1.02*  CALCIUM 7.7* 8.7* 8.0* 8.3*    GFR: Estimated Creatinine Clearance: 38.2 mL/min (A) (by C-G formula based on SCr of 1.02 mg/dL (H)).  Scheduled Meds:  Chlorhexidine Gluconate Cloth  6 each Topical Q0600   guaiFENesin-dextromethorphan  10 mL Oral Q4H   Continuous Infusions:  promethazine (PHENERGAN) injection (IM or IVPB) Stopped (06/01/22 1315)   Imaging and lab data was personally reviewed No results found.  LOS: 13 days   Author: Debbe Odea  06/06/2022 5:45 PM  To contact [facility]>   Check the care team in [facility] and look for the attending/consulting [facility]  provider listed  Log into [email or web address] and use [place]'s universal password   Go to> "[facility]"  and find provider  If you still have difficulty reaching the provider, please page the [facility] (Director on Call) for the Hospitalists listed on amion

## 2022-06-07 DIAGNOSIS — J9621 Acute and chronic respiratory failure with hypoxia: Secondary | ICD-10-CM | POA: Diagnosis not present

## 2022-06-07 DIAGNOSIS — R5381 Other malaise: Secondary | ICD-10-CM | POA: Diagnosis not present

## 2022-06-07 DIAGNOSIS — Z7189 Other specified counseling: Secondary | ICD-10-CM

## 2022-06-07 DIAGNOSIS — Z515 Encounter for palliative care: Secondary | ICD-10-CM | POA: Diagnosis not present

## 2022-06-07 DIAGNOSIS — J8410 Pulmonary fibrosis, unspecified: Secondary | ICD-10-CM | POA: Diagnosis not present

## 2022-06-07 DIAGNOSIS — Z66 Do not resuscitate: Secondary | ICD-10-CM | POA: Insufficient documentation

## 2022-06-07 NOTE — Progress Notes (Signed)
Daily Progress Note   Patient Name: Veronica Stone      Date: 06/07/2022 DOB: [birth year]  Age: 89 y.o. MRN#: [id number] Attending Physician: Debbe Odea, MD Primary Care Physician: Deland Pretty, MD Admit Date: 05/21/2022  Reason for Consultation/Follow-up: Establishing goals of care  HPI/Brief Hospital Review: 89 y.o. female admitted on 05/19/2022 with past medical history significant for pulmonary fibrosis on 4 L of oxygen at home, chronic steroid use due to RA for which she is on Remicade, history of AAA, gastric ulcer, history of DVT comes in c/o shortness of breath that started about 2 weeks prior to admission, cyanotic on arrival with saturations around 80% in the ED placed on 12 L of oxygen, CT angio of the chest showed bilateral PE, new patchy groundglass opacity bilaterally.     Started on IV heparin resulting in lower GI bleed, EGD revealed nonbleeding gastric ulcer   Colonoscopy was done that showed malignant tumor of the sigmoid.   Biopsy-proven adenocarcinoma.   Palliative Medicine consulted for assisting with ongoing goals of care conversations.   Transitioned to Full Comfort Care 1/19 by primary medical team  Subjective: Extensive chart review has been completed prior to meeting patient including labs, vital signs, imaging, progress notes, orders, and available advanced directive documents from current and previous encounters.    Visited with Veronica Stone at her bedside. Daughter/Veronica Stone at bedside during time of visit. Veronica Stone resting comfortably at time of visit, remains on 4L [place]. According to daughter, Veronica Stone had a good night, no concerns. Veronica Stone continues to refuse morphine for pain/increased WOB but has relied upon alprazolam for relief of anxiety. Daughter  mentions that Veronica Stone asked to go home to rest in her bed. Offered hospice liaison to come and speak again, daughter declines at this time but will consider this.  PMT to continue to follow for ongoing support and symptom management.  Objective:    Vital Signs: BP 127/72 (BP Location: Left Arm)   Pulse 92   Temp 98.1 F (36.7 C) (Oral)   Resp 17   Ht '5\' 3"'$  (1.6 m)   Wt 79.8 kg   SpO2 95%   BMI 31.16 kg/m  SpO2: SpO2: 95 % O2 Device: O2 Device: Nasal Cannula O2 Flow Rate: O2 Flow Rate (L/min): 4 L/min   Palliative Care  Assessment & Plan   Assessment/Recommendation/Plan  DNR Comfort measures PMT to continue to follow for ongoing support  Thank you for allowing the Palliative Medicine Team to assist in the care of this patient.  Total time:  25 minutes  Greater than 50%  of this time was spent counseling and coordinating care related to the above assessment and plan.  Theodoro Grist, DNP, AGNP-C Palliative Medicine   Please contact Palliative Medicine Team phone at [phone] for questions and concerns.

## 2022-06-07 NOTE — Progress Notes (Addendum)
  [facility] Progress Note  Patient: Veronica Stone     VPX:[id number]  DOA: 05/18/2022   PCP: Deland Pretty, MD        Principal Problem:   Acute on chronic respiratory failure with hypoxia [facility]) Active Problems:   Pulmonary embolism without acute cor pulmonale   Placement of IVC filter   Pulmonary fibrosis ([place])   Colon cancer with bleeding   Dehydration   Elevated lactic acid level   Elevated troponin   Pressure injury of skin   Essential hypertension   AAA (abdominal aortic aneurysm) without rupture    Rheumatoid arthritis [facility])   Physical deconditioning   DNR/ Comfort care     Brief hospital course: This is an 89 year old female with pulmonary fibrosis and chronic respiratory failure on 4 L of oxygen, rheumatoid arthritis, gastric ulcer, abdominal aortic aneurysm, chronic diastolic heart failure, essential hypertension, DVT who presented to the hospital for shortness of breath for 2 weeks.  Pulse ox 80% in the ED.  CTA revealed bilateral PE and bilateral groundglass opacities.  Started on IV aspirin and subsequently developed a lower GI bleed.  GI consulted and EGD and colonoscopy revealed nonbleeding gastric ulcer with a clean base and a malignant tumor in the sigmoid colon which was biopsied.  Biopsy revealed adenocarcinoma.  The patient continued to have lower GI bleeding and heparin was discontinued.  Despite discontinuing this she continued to have bleeding.  Which eventually stopped.  Radiation oncology was consulted along with med unk and the plan for radiation was discussed.  On 6/15 she first developed sudden onset shortness of breath and worsening hypoxia.  Temperature was 102.7.  She was started on broad-spectrum antibiotics and received a dose of IV Lasix.  There was a concern for aspiration pneumonia. She did not improve with antibiotics or IV steroids.  Subsequent to this event, her oral intake remained poor. She expressed a desire for comfort care repeatedly.   1/19> She was transitioned to comfort care after discussion with her and with her daughter, Veronica Stone.  1/2- daughter at bedside- patient sleeping peacefully 1/3- daughter, Veronica Stone, at bedside - patient is alert and oriented x 3 - is breathing rapidly at about 30 bpm with shallow breaths - she states she does not feel any dyspnea  - oxygen via [place] at 4 L   NOTE:  Mrs Glaude's (patient) goal is to die in her sleep like her sister who also had pulmonary fibrosis did. She wants to avoid Morphine if possible but will ask for it if she feels short of breath. Daughter is often at her beside and patient is very comfortable.  Continue comfort care measures. Expect patient to expire in the hospital as [facility] place is closed for renovations.   Level of Care: Level of care: Med-Surg Total time on patient care: 10 minutes Author: Debbe Odea  06/07/2022 12:38 PM  To contact [facility]>   Check the care team in [facility] and look for the attending/consulting [facility] provider listed  Log into [email or web address] and use [place]'s universal password   Go to> "[facility]"  and find provider  If you still have difficulty reaching the provider, please page the [facility] (Director on Call) for the Hospitalists listed on amion

## 2022-06-07 NOTE — TOC Progression Note (Signed)
Transition of Care [facility]) - Progression Note    Patient Details  Name: Veronica Stone MRN: [id number] Date of Birth: Sep 12, 1931  Transition of Care [facility]) CM/SW [place], RN Phone Number: 06/07/2022, 3:03 PM  Clinical Narrative:    Cm met with the patient's family at the bedside - No TOC needs at this time.  Patient resting quietly and under comfort care orders at this time - expected hospital death per family - since [facility] is closed for renovations at this time no Palliative care note.   Expected Discharge Plan: [place] Barriers to Discharge: Continued Medical Work up  Expected Discharge Plan and [place] arrangements for the past 2 months: Single Family Home                 DME Arranged: N/A           HH Agency:  (TBD)         Social Determinants of Health (SDOH) Interventions SDOH Screenings   Food Insecurity: No Food Insecurity (05/31/2022)  Housing: Low Risk  (05/31/2022)  Transportation Needs: No Transportation Needs (05/31/2022)  Utilities: Not At Risk (05/31/2022)  Tobacco Use: Medium Risk (06/01/2022)    Readmission Risk Interventions    06/07/2022    3:03 PM  Readmission Risk Prevention Plan  Post Dischage Appt Complete  Medication Screening Complete  Transportation Screening Complete

## 2022-06-08 DIAGNOSIS — J9621 Acute and chronic respiratory failure with hypoxia: Secondary | ICD-10-CM | POA: Diagnosis not present

## 2022-06-08 DIAGNOSIS — R5381 Other malaise: Secondary | ICD-10-CM | POA: Diagnosis not present

## 2022-06-08 DIAGNOSIS — Z515 Encounter for palliative care: Secondary | ICD-10-CM | POA: Diagnosis not present

## 2022-06-08 DIAGNOSIS — J8410 Pulmonary fibrosis, unspecified: Secondary | ICD-10-CM | POA: Diagnosis not present

## 2022-06-08 NOTE — Progress Notes (Signed)
PROGRESS NOTE    Veronica Stone  UXL:[id number] DOB: [birth year] DOA: 06/12/2022 PCP: Deland Pretty, MD     Brief Narrative:  Veronica Stone is an 89 year old female with pulmonary fibrosis and chronic respiratory failure on 4 L of oxygen, rheumatoid arthritis, gastric ulcer, abdominal aortic aneurysm, chronic diastolic heart failure, essential hypertension, DVT who presented to the hospital for shortness of breath for 2 weeks.  Pulse ox 80% in the ED.  CTA revealed bilateral PE and bilateral groundglass opacities.  Started on IV heparin and subsequently developed a lower GI bleed.  GI consulted and EGD and colonoscopy revealed nonbleeding gastric ulcer with a clean base and a malignant tumor in the sigmoid colon which was biopsied.  Biopsy revealed adenocarcinoma.  The patient continued to have lower GI bleeding and heparin was discontinued.  Despite discontinuing anticoagulation, she continued to have bleeding.  Which eventually stopped.  Radiation oncology was consulted along with med oncology and the plan for radiation was discussed.  On 1/15, she first developed sudden onset shortness of breath and worsening hypoxia.  Temperature was 102.7.  She was started on broad-spectrum antibiotics and received a dose of IV Lasix.  There was a concern for aspiration pneumonia. She did not improve with antibiotics or IV steroids.  Subsequent to this event, her oral intake remained poor. She expressed a desire for comfort care repeatedly and she was transitioned to full comfort measures 06/03/22.   New events last 24 hours / Subjective: Patient resting with eyes closed, appears to be comfortable.  Granddaughter is at bedside.  Reports that patient did not sleep overnight due to continued coughing.  Assessment & Plan:   Principal Problem:   Acute on chronic respiratory failure with hypoxia (HCC) Active Problems:   Colon cancer with bleeding   Essential hypertension   AAA (abdominal aortic aneurysm)  without rupture (HCC)   Rheumatoid arthritis (HCC)   Pulmonary fibrosis (HCC)   Physical deconditioning   Pulmonary embolism without acute cor pulmonale (HCC)   History of rheumatoid arthritis   Dehydration   Elevated lactic acid level   Elevated troponin   Pressure injury of skin   Presence of IVC filter   Goals of care, counseling/discussion   DNR (do not resuscitate)   Continue full comfort measures.  Anticipate in-hospital death.  Palliative care medicine has following.   In agreement with assessment of the pressure ulcer as below:  Pressure Injury 05/30/22 Buttocks Left Stage 2 -  Partial thickness loss of dermis presenting as a shallow open injury with a red, pink wound bed without slough. very small stage 2 pink in color skin pink around it (Active)  05/30/22 1950  Location: Buttocks  Location Orientation: Left  Staging: Stage 2 -  Partial thickness loss of dermis presenting as a shallow open injury with a red, pink wound bed without slough.  Wound Description (Comments): very small stage 2 pink in color skin pink around it  Present on Admission:   Dressing Type Foam - Lift dressing to assess site every shift 06/06/22 2015     Family Communication: Granddaughter at bedside Disposition Plan:  Status is: Inpatient Remains inpatient appropriate because: Anticipate in-hospital death   Antimicrobials:  Anti-infectives (From admission, onward)    Start     Dose/Rate Route Frequency Ordered Stop   06/01/22 1000  vancomycin (VANCOCIN) IVPB 1000 mg/200 mL premix  Status:  Discontinued        1,000 mg 200 mL/hr over 60 Minutes Intravenous Every 36 hours  05/30/22 2124 06/02/22 1225   05/31/22 1000  cefTRIAXone (ROCEPHIN) 2 g in sodium chloride 0.9 % 100 mL IVPB  Status:  Discontinued        2 g 200 mL/hr over 30 Minutes Intravenous Every 24 hours 05/31/22 0904 06/02/22 1225   05/30/22 2215  vancomycin (VANCOREADY) IVPB 1500 mg/300 mL        1,500 mg 150 mL/hr over 120  Minutes Intravenous  Once 05/30/22 2124 05/31/22 0158   05/30/22 2200  ceFEPIme (MAXIPIME) 2 g in sodium chloride 0.9 % 100 mL IVPB        2 g 200 mL/hr over 30 Minutes Intravenous  Once 05/30/22 2108 05/30/22 2329   05/30/22 2200  metroNIDAZOLE (FLAGYL) IVPB 500 mg  Status:  Discontinued        500 mg 100 mL/hr over 60 Minutes Intravenous Every 12 hours 05/30/22 2108 06/02/22 1225   05/30/22 2200  vancomycin (VANCOCIN) IVPB 1000 mg/200 mL premix  Status:  Discontinued        1,000 mg 200 mL/hr over 60 Minutes Intravenous  Once 05/30/22 2108 05/30/22 2111   05/30/22 2200  vancomycin (VANCOREADY) IVPB 1500 mg/300 mL        1,500 mg 150 mL/hr over 120 Minutes Intravenous  Once 05/30/22 2111 05/31/22 0155   05/30/22 2200  ceFEPIme (MAXIPIME) 2 g in sodium chloride 0.9 % 100 mL IVPB  Status:  Discontinued        2 g 200 mL/hr over 30 Minutes Intravenous Every 12 hours 05/30/22 2124 05/31/22 0904   05/27/22 0000  vancomycin (VANCOCIN) IVPB 1000 mg/200 mL premix  Status:  Discontinued        1,000 mg 200 mL/hr over 60 Minutes Intravenous Every 48 hours 05/25/22 0052 05/25/22 0150   05/26/22 0000  azithromycin (ZITHROMAX) 500 mg in sodium chloride 0.9 % 250 mL IVPB        500 mg 250 mL/hr over 60 Minutes Intravenous Every 24 hours 05/25/22 0102 05/30/22 0154   05/25/22 2200  ceFEPIme (MAXIPIME) 2 g in sodium chloride 0.9 % 100 mL IVPB  Status:  Discontinued        2 g 200 mL/hr over 30 Minutes Intravenous Every 24 hours 05/25/22 0052 05/25/22 0150   05/25/22 2200  cefTRIAXone (ROCEPHIN) 2 g in sodium chloride 0.9 % 100 mL IVPB        2 g 200 mL/hr over 30 Minutes Intravenous Every 24 hours 05/25/22 0102 05/28/22 2116   05/25/22 0000  ceFEPIme (MAXIPIME) 2 g in sodium chloride 0.9 % 100 mL IVPB        2 g 200 mL/hr over 30 Minutes Intravenous  Once 06/15/2022 2249 05/25/22 0011   06/08/2022 2315  metroNIDAZOLE (FLAGYL) IVPB 500 mg  Status:  Discontinued        500 mg 100 mL/hr over 60 Minutes  Intravenous 2 times daily 05/16/2022 2246 05/25/22 0150   05/23/2022 2300  vancomycin (VANCOREADY) IVPB 1500 mg/300 mL        1,500 mg 150 mL/hr over 120 Minutes Intravenous  Once 05/17/2022 2249 05/25/22 0345   06/06/2022 2145  cefTRIAXone (ROCEPHIN) 1 g in sodium chloride 0.9 % 100 mL IVPB        1 g 200 mL/hr over 30 Minutes Intravenous  Once 06/03/2022 2131 06/03/2022 2305   05/31/2022 2145  azithromycin (ZITHROMAX) 500 mg in sodium chloride 0.9 % 250 mL IVPB        500 mg 250 mL/hr over 60  Minutes Intravenous  Once 06/12/2022 2131 05/25/22 0138        Objective: Vitals:   06/05/22 0532 06/05/22 2015 06/06/22 2055 06/07/22 0744  BP: 137/75 117/68 115/72 127/72  Pulse: 100 99 80 92  Resp: 16 (!) '24 16 17  '$ Temp: 98 F (36.7 C) 99.3 F (37.4 C) 98.2 F (36.8 C) 98.1 F (36.7 C)  TempSrc: Oral Oral Oral Oral  SpO2: 93% 94% 96% 95%  Weight:      Height:       No intake or output data in the 24 hours ending 06/08/22 1221 Filed Weights   06/03/2022 1917  Weight: 79.8 kg    Examination:  General exam: Appears calm and comfortable   Data Reviewed: I have personally reviewed following labs and imaging studies  CBC: Recent Labs  Lab 06/02/22 0113 06/03/22 0243  WBC 16.3* 19.8*  HGB 13.2 13.5  HCT 40.5 40.0  MCV 86.7 85.5  PLT 234 388   Basic Metabolic Panel: Recent Labs  Lab 06/02/22 0113 06/03/22 0243  NA 139 141  K 4.2 3.7  CL 103 103  CO2 23 26  GLUCOSE 134* 159*  BUN 16 26*  CREATININE 1.11* 1.02*  CALCIUM 8.0* 8.3*   GFR: Estimated Creatinine Clearance: 38.2 mL/min (A) (by C-G formula based on SCr of 1.02 mg/dL (H)). Liver Function Tests: No results for input(s): "AST", "ALT", "ALKPHOS", "BILITOT", "PROT", "ALBUMIN" in the last 168 hours. No results for input(s): "LIPASE", "AMYLASE" in the last 168 hours. No results for input(s): "AMMONIA" in the last 168 hours. Coagulation Profile: No results for input(s): "INR", "PROTIME" in the last 168 hours. Cardiac  Enzymes: No results for input(s): "CKTOTAL", "CKMB", "CKMBINDEX", "TROPONINI" in the last 168 hours. BNP (last 3 results) No results for input(s): "PROBNP" in the last 8760 hours. HbA1C: No results for input(s): "HGBA1C" in the last 72 hours. CBG: No results for input(s): "GLUCAP" in the last 168 hours. Lipid Profile: No results for input(s): "CHOL", "HDL", "LDLCALC", "TRIG", "CHOLHDL", "LDLDIRECT" in the last 72 hours. Thyroid Function Tests: No results for input(s): "TSH", "T4TOTAL", "FREET4", "T3FREE", "THYROIDAB" in the last 72 hours. Anemia Panel: No results for input(s): "VITAMINB12", "FOLATE", "FERRITIN", "TIBC", "IRON", "RETICCTPCT" in the last 72 hours. Sepsis Labs: No results for input(s): "PROCALCITON", "LATICACIDVEN" in the last 168 hours.  Recent Results (from the past 240 hour(s))  Culture, blood (x 2)     Status: None   Collection Time: 05/30/22  9:31 PM   Specimen: BLOOD RIGHT ARM  Result Value Ref Range Status   Specimen Description BLOOD RIGHT ARM  Final   Special Requests   Final    BOTTLES DRAWN AEROBIC AND ANAEROBIC Blood Culture adequate volume   Culture   Final    NO GROWTH 5 DAYS Performed at [facility], [address], [place], [place] [address]    Report Status 06/04/2022 FINAL  Final  Culture, blood (x 2)     Status: None   Collection Time: 05/30/22  9:31 PM   Specimen: BLOOD  Result Value Ref Range Status   Specimen Description BLOOD RIGHT ANTECUBITAL  Final   Special Requests   Final    BOTTLES DRAWN AEROBIC AND ANAEROBIC Blood Culture results may not be optimal due to an inadequate volume of blood received in culture bottles   Culture   Final    NO GROWTH 5 DAYS Performed at [facility], [employer] [address], [place], [place] [address]  Report Status 06/04/2022 FINAL  Final  MRSA Next Gen by PCR, Nasal     Status: None   Collection Time: 06/01/22  8:06 AM   Specimen: Nasal Mucosa; Nasal Swab  Result Value Ref Range Status   MRSA  by PCR Next Gen NOT DETECTED NOT DETECTED Final    Comment: (NOTE) The GeneXpert MRSA Assay (FDA approved for NASAL specimens only), is one component of a comprehensive MRSA colonization surveillance program. It is not intended to diagnose MRSA infection nor to guide or monitor treatment for MRSA infections. Test performance is not FDA approved in patients less than 27 years old. Performed at [facility], [place] [address], [place], [place] [address]   Respiratory (~20 pathogens) panel by PCR     Status: None   Collection Time: 06/01/22 12:16 PM   Specimen: Nasopharyngeal Swab; Respiratory  Result Value Ref Range Status   Adenovirus NOT DETECTED NOT DETECTED Final   Coronavirus 229E NOT DETECTED NOT DETECTED Final    Comment: (NOTE) The Coronavirus on the Respiratory Panel, DOES NOT test for the novel  Coronavirus (2019 nCoV)    Coronavirus HKU1 NOT DETECTED NOT DETECTED Final   Coronavirus NL63 NOT DETECTED NOT DETECTED Final   Coronavirus OC43 NOT DETECTED NOT DETECTED Final   Metapneumovirus NOT DETECTED NOT DETECTED Final   Rhinovirus / Enterovirus NOT DETECTED NOT DETECTED Final   Influenza A NOT DETECTED NOT DETECTED Final   Influenza B NOT DETECTED NOT DETECTED Final   Parainfluenza Virus 1 NOT DETECTED NOT DETECTED Final   Parainfluenza Virus 2 NOT DETECTED NOT DETECTED Final   Parainfluenza Virus 3 NOT DETECTED NOT DETECTED Final   Parainfluenza Virus 4 NOT DETECTED NOT DETECTED Final   Respiratory Syncytial Virus NOT DETECTED NOT DETECTED Final   Bordetella pertussis NOT DETECTED NOT DETECTED Final   Bordetella Parapertussis NOT DETECTED NOT DETECTED Final   Chlamydophila pneumoniae NOT DETECTED NOT DETECTED Final   Mycoplasma pneumoniae NOT DETECTED NOT DETECTED Final    Comment: Performed at [facility], [place]. [address] [place], [place] [address]  Resp panel by RT-PCR (RSV, Flu A&B, Covid) Anterior Nasal Swab     Status: None   Collection Time: 06/01/22  12:16 PM   Specimen: Anterior Nasal Swab  Result Value Ref Range Status   SARS Coronavirus 2 by RT PCR NEGATIVE NEGATIVE Final    Comment: (NOTE) SARS-CoV-2 target nucleic acids are NOT DETECTED.  The SARS-CoV-2 RNA is generally detectable in upper respiratory specimens during the acute phase of infection. The lowest concentration of SARS-CoV-2 viral copies this assay can detect is 138 copies/mL. A negative result does not preclude SARS-Cov-2 infection and should not be used as the sole basis for treatment or other patient management decisions. A negative result may occur with  improper specimen collection/handling, submission of specimen other than nasopharyngeal swab, presence of viral mutation(s) within the areas targeted by this assay, and inadequate number of viral copies(<138 copies/mL). A negative result must be combined with clinical observations, patient history, and epidemiological information. The expected result is Negative.  Fact Sheet for Patients:  EntrepreneurPulse.com.au  Fact Sheet for Healthcare Providers:  IncredibleEmployment.be  This test is no t yet approved or cleared by the [place] FDA and  has been authorized for detection and/or diagnosis of SARS-CoV-2 by FDA under an Emergency Use Authorization (EUA). This EUA will remain  in effect (meaning this test can be used) for the duration of the COVID-19 declaration under Section  564(b)(1) of the Act, 21 U.S.C.section 360bbb-3(b)(1), unless the authorization is terminated  or revoked sooner.       Influenza A by PCR NEGATIVE NEGATIVE Final   Influenza B by PCR NEGATIVE NEGATIVE Final    Comment: (NOTE) The Xpert Xpress SARS-CoV-2/FLU/RSV plus assay is intended as an aid in the diagnosis of influenza from Nasopharyngeal swab specimens and should not be used as a sole basis for treatment. Nasal washings and aspirates are unacceptable for Xpert Xpress  SARS-CoV-2/FLU/RSV testing.  Fact Sheet for Patients: EntrepreneurPulse.com.au  Fact Sheet for Healthcare Providers: IncredibleEmployment.be  This test is not yet approved or cleared by the [place] FDA and has been authorized for detection and/or diagnosis of SARS-CoV-2 by FDA under an Emergency Use Authorization (EUA). This EUA will remain in effect (meaning this test can be used) for the duration of the COVID-19 declaration under Section 564(b)(1) of the Act, 21 U.S.C. section 360bbb-3(b)(1), unless the authorization is terminated or revoked.     Resp Syncytial Virus by PCR NEGATIVE NEGATIVE Final    Comment: (NOTE) Fact Sheet for Patients: EntrepreneurPulse.com.au  Fact Sheet for Healthcare Providers: IncredibleEmployment.be  This test is not yet approved or cleared by the [place] FDA and has been authorized for detection and/or diagnosis of SARS-CoV-2 by FDA under an Emergency Use Authorization (EUA). This EUA will remain in effect (meaning this test can be used) for the duration of the COVID-19 declaration under Section 564(b)(1) of the Act, 21 U.S.C. section 360bbb-3(b)(1), unless the authorization is terminated or revoked.  Performed at [facility], [place] [address] [place], [place] [address]       Radiology Studies: No results found.    Scheduled Meds:  guaiFENesin-dextromethorphan  10 mL Oral Q4H   Continuous Infusions:  promethazine (PHENERGAN) injection (IM or IVPB) Stopped (06/01/22 1315)     LOS: 15 days   Time spent: 20 minutes   Dessa Phi, DO [facility] 06/08/2022, 12:21 PM   Available via Epic secure chat 7am-7pm After these hours, please refer to coverage provider listed on [email or web address]

## 2022-06-08 NOTE — Progress Notes (Signed)
Daily Progress Note   Patient Name: Veronica Stone      Date: 06/08/2022 DOB: Aug 08, 1931  Age: 89 y.o. MRN#: [id number] Attending Physician: Dessa Phi, DO Primary Care Physician: Deland Pretty, MD Admit Date: 06/05/2022  Reason for Consultation/Follow-up: Establishing goals of care  HPI/Brief Hospital Review: 89 y.o. female admitted on 05/28/2022 with past medical history significant for pulmonary fibrosis on 4 L of oxygen at home, chronic steroid use due to RA for which she is on Remicade, history of AAA, gastric ulcer, history of DVT comes in c/o shortness of breath that started about 2 weeks prior to admission, cyanotic on arrival with saturations around 80% in the ED placed on 12 L of oxygen, CT angio of the chest showed bilateral PE, new patchy groundglass opacity bilaterally.     Started on IV heparin resulting in lower GI bleed, EGD revealed nonbleeding gastric ulcer   Colonoscopy was done that showed malignant tumor of the sigmoid.   Biopsy-proven adenocarcinoma.   Palliative Medicine consulted for assisting with ongoing goals of care conversations.   Transitioned to Full Comfort Care 1/19 by primary medical team  1/24 Remains full comfort measures Remains on 4L [place]  Subjective: Extensive chart review has been completed prior to meeting patient including labs, vital signs, imaging, progress notes, orders, and available advanced directive documents from current and previous encounters.    Visited with Veronica Stone at her bedside. Resting comfortably, no signs of discomfort or distress. Granddaughter at bedside during time of visit. Granddaughter concerned about Veronica Stone's suffering. Reassured granddaughter nursing staff and medical team available at all times to  prevent/alleviate suffering. Granddaughter does not feel at this time that Veronica Stone is having any pain or discomfort. Granddaughter reports Veronica Stone able to tolerate PO liquids but has not had food prior to her colonoscopy. Provided education to granddaughter around thirst and hunger sensations at end of life likely being diminished. Granddaughter expressed understanding.  PMT to continue to follow for ongoing support and symptom management.   Palliative Care Assessment & Plan   Assessment/Recommendation/Plan  DNR/Comfort Measures PMT to continue to follow for ongoing support  Thank you for allowing the Palliative Medicine Team to assist in the care of this patient.  Total time:  25 minutes  Greater than 50%  of this time was spent counseling  and coordinating care related to the above assessment and plan.  Theodoro Grist, DNP, AGNP-C Palliative Medicine   Please contact Palliative Medicine Team phone at [phone] for questions and concerns.

## 2022-06-09 DIAGNOSIS — J9621 Acute and chronic respiratory failure with hypoxia: Secondary | ICD-10-CM | POA: Diagnosis not present

## 2022-06-09 DIAGNOSIS — R5381 Other malaise: Secondary | ICD-10-CM | POA: Diagnosis not present

## 2022-06-09 DIAGNOSIS — J8410 Pulmonary fibrosis, unspecified: Secondary | ICD-10-CM | POA: Diagnosis not present

## 2022-06-09 DIAGNOSIS — Z515 Encounter for palliative care: Secondary | ICD-10-CM | POA: Diagnosis not present

## 2022-06-09 MED ORDER — GUAIFENESIN-DM 100-10 MG/5ML PO SYRP
10.0000 mL | ORAL_SOLUTION | ORAL | Status: DC | PRN
  Filled 2022-06-09: qty 10

## 2022-06-09 NOTE — Progress Notes (Signed)
Daily Progress Note   Patient Name: Veronica Stone      Date: 06/09/2022 DOB: 07-07-1931  Age: 89 y.o. MRN#: [id number] Attending Physician: Dessa Phi, DO Primary Care Physician: Deland Pretty, MD Admit Date: 05/31/2022  Reason for Consultation/Follow-up: Establishing goals of care  HPI/Brief Hospital Review: 89 y.o. female admitted on 05/30/2022 with past medical history significant for pulmonary fibrosis on 4 L of oxygen at home, chronic steroid use due to RA for which she is on Remicade, history of AAA, gastric ulcer, history of DVT comes in c/o shortness of breath that started about 2 weeks prior to admission, cyanotic on arrival with saturations around 80% in the ED placed on 12 L of oxygen, CT angio of the chest showed bilateral PE, new patchy groundglass opacity bilaterally.     Started on IV heparin resulting in lower GI bleed, EGD revealed nonbleeding gastric ulcer   Colonoscopy was done that showed malignant tumor of the sigmoid. Biopsy-proven adenocarcinoma.   Palliative Medicine consulted for assisting with ongoing goals of care conversations.  Transitioned to Comfort Measures by primary medical team 1/19  Subjective: Extensive chart review has been completed prior to meeting patient including labs, vital signs, imaging, progress notes, orders, and available advanced directive documents from current and previous encounters.    Visited with [name] at her bedside. Resting comfortably, did not respond during visit. Daughter/[name] at bedside during time of visit. Wadie Lessen, NP with PMT also present during time of visit. Inquired about removing continuous pulse ox monitoring with [name]-explained with focus on comfort, continuous monitoring can provide increased anxiety or  worry amongst family and patient. Focus of comfort care is to provide comfort measures to patient without monitoring of values. [name] expressed understanding but requested these conversations be had with Linda/daughter and decision making to be left to her.  [name] appears comfortable with no signs of distress. Remains on 4L [place].  PMT to continue to follow for ongoing support. Will address removal of pulse ox monitoring and possible slow titration of supplemental oxygen as requested with daughter/Linda.  Objective:  Vital Signs: BP (!) 140/85 (BP Location: Left Arm)   Pulse 94   Temp 99.3 F (37.4 C) (Oral)   Resp 16   Ht '5\' 3"'$  (1.6 m)   Wt 79.8 kg   SpO2 92%  BMI 31.16 kg/m  SpO2: SpO2: 92 % O2 Device: O2 Device: Nasal Cannula O2 Flow Rate: O2 Flow Rate (L/min): 4 L/min   Palliative Care Assessment & Plan   Assessment/Recommendation/Plan  DNR Continue Comfort Measures PMT to continue to provide ongoing support  Thank you for allowing the Palliative Medicine Team to assist in the care of this patient.  Total time:  25 minutes  Greater than 50%  of this time was spent counseling and coordinating care related to the above assessment and plan.  Theodoro Grist, DNP, AGNP-C Palliative Medicine   Please contact Palliative Medicine Team phone at [phone] for questions and concerns.

## 2022-06-09 NOTE — Plan of Care (Signed)
  Problem: Education: Goal: Knowledge of General Education information will improve Description: Including pain rating scale, medication(s)/side effects and non-pharmacologic comfort measures Outcome: Progressing

## 2022-06-09 NOTE — Progress Notes (Signed)
PROGRESS NOTE    Veronica Stone  ZMO:[id number] DOB: [birth year] DOA: 05/27/2022 PCP: Deland Pretty, MD     Brief Narrative:  Jacqulyne Gladue is an 89 year old female with pulmonary fibrosis and chronic respiratory failure on 4 L of oxygen, rheumatoid arthritis, gastric ulcer, abdominal aortic aneurysm, chronic diastolic heart failure, essential hypertension, DVT who presented to the hospital for shortness of breath for 2 weeks.  Pulse ox 80% in the ED.  CTA revealed bilateral PE and bilateral groundglass opacities.  Started on IV heparin and subsequently developed a lower GI bleed.  GI consulted and EGD and colonoscopy revealed nonbleeding gastric ulcer with a clean base and a malignant tumor in the sigmoid colon which was biopsied.  Biopsy revealed adenocarcinoma.  The patient continued to have lower GI bleeding and heparin was discontinued.  Despite discontinuing anticoagulation, she continued to have bleeding.  Which eventually stopped.  Radiation oncology was consulted along with med oncology and the plan for radiation was discussed.  On 1/15, she first developed sudden onset shortness of breath and worsening hypoxia.  Temperature was 102.7.  She was started on broad-spectrum antibiotics and received a dose of IV Lasix.  There was a concern for aspiration pneumonia. She did not improve with antibiotics or IV steroids.  Subsequent to this event, her oral intake remained poor. She expressed a desire for comfort care repeatedly and she was transitioned to full comfort measures 06/03/22.   New events last 24 hours / Subjective: Patient resting with eyes closed, appears to be comfortable.  Daughter is at bedside.  Patient continues to have some intermittent cough but otherwise has been comfortable.  Discussed with daughter role for oxygen, bedside pulse ox monitoring.  These interventions can be discontinued as we continue to provide full comfort measures in anticipation for death.  Messaged with  palliative care medicine to continue discussion.  Assessment & Plan:   Principal Problem:   Acute on chronic respiratory failure with hypoxia (HCC) Active Problems:   Colon cancer with bleeding   Essential hypertension   AAA (abdominal aortic aneurysm) without rupture (HCC)   Rheumatoid arthritis (HCC)   Pulmonary fibrosis (HCC)   Physical deconditioning   Pulmonary embolism without acute cor pulmonale (HCC)   History of rheumatoid arthritis   Dehydration   Elevated lactic acid level   Elevated troponin   Pressure injury of skin   Presence of IVC filter   Goals of care, counseling/discussion   DNR (do not resuscitate)   Continue full comfort measures.  Anticipate in-hospital death.  Palliative care medicine is following.   In agreement with assessment of the pressure ulcer as below:  Pressure Injury 05/30/22 Buttocks Left Stage 2 -  Partial thickness loss of dermis presenting as a shallow open injury with a red, pink wound bed without slough. very small stage 2 pink in color skin pink around it (Active)  05/30/22 1950  Location: Buttocks  Location Orientation: Left  Staging: Stage 2 -  Partial thickness loss of dermis presenting as a shallow open injury with a red, pink wound bed without slough.  Wound Description (Comments): very small stage 2 pink in color skin pink around it  Present on Admission:   Dressing Type Foam - Lift dressing to assess site every shift 06/06/22 2015     Family Communication: Daughter at bedside Disposition Plan:  Status is: Inpatient Remains inpatient appropriate because: Anticipate in-hospital death   Antimicrobials:  Anti-infectives (From admission, onward)    Start  Dose/Rate Route Frequency Ordered Stop   06/01/22 1000  vancomycin (VANCOCIN) IVPB 1000 mg/200 mL premix  Status:  Discontinued        1,000 mg 200 mL/hr over 60 Minutes Intravenous Every 36 hours 05/30/22 2124 06/02/22 1225   05/31/22 1000  cefTRIAXone (ROCEPHIN) 2 g  in sodium chloride 0.9 % 100 mL IVPB  Status:  Discontinued        2 g 200 mL/hr over 30 Minutes Intravenous Every 24 hours 05/31/22 0904 06/02/22 1225   05/30/22 2215  vancomycin (VANCOREADY) IVPB 1500 mg/300 mL        1,500 mg 150 mL/hr over 120 Minutes Intravenous  Once 05/30/22 2124 05/31/22 0158   05/30/22 2200  ceFEPIme (MAXIPIME) 2 g in sodium chloride 0.9 % 100 mL IVPB        2 g 200 mL/hr over 30 Minutes Intravenous  Once 05/30/22 2108 05/30/22 2329   05/30/22 2200  metroNIDAZOLE (FLAGYL) IVPB 500 mg  Status:  Discontinued        500 mg 100 mL/hr over 60 Minutes Intravenous Every 12 hours 05/30/22 2108 06/02/22 1225   05/30/22 2200  vancomycin (VANCOCIN) IVPB 1000 mg/200 mL premix  Status:  Discontinued        1,000 mg 200 mL/hr over 60 Minutes Intravenous  Once 05/30/22 2108 05/30/22 2111   05/30/22 2200  vancomycin (VANCOREADY) IVPB 1500 mg/300 mL        1,500 mg 150 mL/hr over 120 Minutes Intravenous  Once 05/30/22 2111 05/31/22 0155   05/30/22 2200  ceFEPIme (MAXIPIME) 2 g in sodium chloride 0.9 % 100 mL IVPB  Status:  Discontinued        2 g 200 mL/hr over 30 Minutes Intravenous Every 12 hours 05/30/22 2124 05/31/22 0904   05/27/22 0000  vancomycin (VANCOCIN) IVPB 1000 mg/200 mL premix  Status:  Discontinued        1,000 mg 200 mL/hr over 60 Minutes Intravenous Every 48 hours 05/25/22 0052 05/25/22 0150   05/26/22 0000  azithromycin (ZITHROMAX) 500 mg in sodium chloride 0.9 % 250 mL IVPB        500 mg 250 mL/hr over 60 Minutes Intravenous Every 24 hours 05/25/22 0102 05/30/22 0154   05/25/22 2200  ceFEPIme (MAXIPIME) 2 g in sodium chloride 0.9 % 100 mL IVPB  Status:  Discontinued        2 g 200 mL/hr over 30 Minutes Intravenous Every 24 hours 05/25/22 0052 05/25/22 0150   05/25/22 2200  cefTRIAXone (ROCEPHIN) 2 g in sodium chloride 0.9 % 100 mL IVPB        2 g 200 mL/hr over 30 Minutes Intravenous Every 24 hours 05/25/22 0102 05/28/22 2116   05/25/22 0000  ceFEPIme  (MAXIPIME) 2 g in sodium chloride 0.9 % 100 mL IVPB        2 g 200 mL/hr over 30 Minutes Intravenous  Once 05/28/2022 2249 05/25/22 0011   05/30/2022 2315  metroNIDAZOLE (FLAGYL) IVPB 500 mg  Status:  Discontinued        500 mg 100 mL/hr over 60 Minutes Intravenous 2 times daily 05/19/2022 2246 05/25/22 0150   06/05/2022 2300  vancomycin (VANCOREADY) IVPB 1500 mg/300 mL        1,500 mg 150 mL/hr over 120 Minutes Intravenous  Once 06/04/2022 2249 05/25/22 0345   06/07/2022 2145  cefTRIAXone (ROCEPHIN) 1 g in sodium chloride 0.9 % 100 mL IVPB        1 g 200 mL/hr over  30 Minutes Intravenous  Once 05/23/2022 2131 06/02/2022 2305   06/02/2022 2145  azithromycin (ZITHROMAX) 500 mg in sodium chloride 0.9 % 250 mL IVPB        500 mg 250 mL/hr over 60 Minutes Intravenous  Once 06/15/2022 2131 05/25/22 0138        Objective: Vitals:   06/05/22 2015 06/06/22 2055 06/07/22 0744 06/09/22 0500  BP: 117/68 115/72 127/72 (!) 140/85  Pulse: 99 80 92 94  Resp: (!) '24 16 17 16  '$ Temp: 99.3 F (37.4 C) 98.2 F (36.8 C) 98.1 F (36.7 C) 99.3 F (37.4 C)  TempSrc: Oral Oral Oral Oral  SpO2: 94% 96% 95% 92%  Weight:      Height:       No intake or output data in the 24 hours ending 06/09/22 1119 Filed Weights   06/06/2022 1917  Weight: 79.8 kg    Examination:  General exam: Appears calm and comfortable   Data Reviewed: I have personally reviewed following labs and imaging studies  CBC: Recent Labs  Lab 06/03/22 0243  WBC 19.8*  HGB 13.5  HCT 40.0  MCV 85.5  PLT 694    Basic Metabolic Panel: Recent Labs  Lab 06/03/22 0243  NA 141  K 3.7  CL 103  CO2 26  GLUCOSE 159*  BUN 26*  CREATININE 1.02*  CALCIUM 8.3*    GFR: Estimated Creatinine Clearance: 38.2 mL/min (A) (by C-G formula based on SCr of 1.02 mg/dL (H)). Liver Function Tests: No results for input(s): "AST", "ALT", "ALKPHOS", "BILITOT", "PROT", "ALBUMIN" in the last 168 hours. No results for input(s): "LIPASE", "AMYLASE" in the last  168 hours. No results for input(s): "AMMONIA" in the last 168 hours. Coagulation Profile: No results for input(s): "INR", "PROTIME" in the last 168 hours. Cardiac Enzymes: No results for input(s): "CKTOTAL", "CKMB", "CKMBINDEX", "TROPONINI" in the last 168 hours. BNP (last 3 results) No results for input(s): "PROBNP" in the last 8760 hours. HbA1C: No results for input(s): "HGBA1C" in the last 72 hours. CBG: No results for input(s): "GLUCAP" in the last 168 hours. Lipid Profile: No results for input(s): "CHOL", "HDL", "LDLCALC", "TRIG", "CHOLHDL", "LDLDIRECT" in the last 72 hours. Thyroid Function Tests: No results for input(s): "TSH", "T4TOTAL", "FREET4", "T3FREE", "THYROIDAB" in the last 72 hours. Anemia Panel: No results for input(s): "VITAMINB12", "FOLATE", "FERRITIN", "TIBC", "IRON", "RETICCTPCT" in the last 72 hours. Sepsis Labs: No results for input(s): "PROCALCITON", "LATICACIDVEN" in the last 168 hours.  Recent Results (from the past 240 hour(s))  Culture, blood (x 2)     Status: None   Collection Time: 05/30/22  9:31 PM   Specimen: BLOOD RIGHT ARM  Result Value Ref Range Status   Specimen Description BLOOD RIGHT ARM  Final   Special Requests   Final    BOTTLES DRAWN AEROBIC AND ANAEROBIC Blood Culture adequate volume   Culture   Final    NO GROWTH 5 DAYS Performed at [facility], [address] [place], [place] [address]    Report Status 06/04/2022 FINAL  Final  Culture, blood (x 2)     Status: None   Collection Time: 05/30/22  9:31 PM   Specimen: BLOOD  Result Value Ref Range Status   Specimen Description BLOOD RIGHT ANTECUBITAL  Final   Special Requests   Final    BOTTLES DRAWN AEROBIC AND ANAEROBIC Blood Culture results may not be optimal due to an inadequate volume of blood received in culture bottles   Culture  Final    NO GROWTH 5 DAYS Performed at [facility], [place] [address] [place], [place] [address]    Report Status 06/04/2022 FINAL   Final  MRSA Next Gen by PCR, Nasal     Status: None   Collection Time: 06/01/22  8:06 AM   Specimen: Nasal Mucosa; Nasal Swab  Result Value Ref Range Status   MRSA by PCR Next Gen NOT DETECTED NOT DETECTED Final    Comment: (NOTE) The GeneXpert MRSA Assay (FDA approved for NASAL specimens only), is one component of a comprehensive MRSA colonization surveillance program. It is not intended to diagnose MRSA infection nor to guide or monitor treatment for MRSA infections. Test performance is not FDA approved in patients less than 20 years old. Performed at [facility], [place] [address] [place], [place] [address]   Respiratory (~20 pathogens) panel by PCR     Status: None   Collection Time: 06/01/22 12:16 PM   Specimen: Nasopharyngeal Swab; Respiratory  Result Value Ref Range Status   Adenovirus NOT DETECTED NOT DETECTED Final   Coronavirus 229E NOT DETECTED NOT DETECTED Final    Comment: (NOTE) The Coronavirus on the Respiratory Panel, DOES NOT test for the novel  Coronavirus (2019 nCoV)    Coronavirus HKU1 NOT DETECTED NOT DETECTED Final   Coronavirus NL63 NOT DETECTED NOT DETECTED Final   Coronavirus OC43 NOT DETECTED NOT DETECTED Final   Metapneumovirus NOT DETECTED NOT DETECTED Final   Rhinovirus / Enterovirus NOT DETECTED NOT DETECTED Final   Influenza A NOT DETECTED NOT DETECTED Final   Influenza B NOT DETECTED NOT DETECTED Final   Parainfluenza Virus 1 NOT DETECTED NOT DETECTED Final   Parainfluenza Virus 2 NOT DETECTED NOT DETECTED Final   Parainfluenza Virus 3 NOT DETECTED NOT DETECTED Final   Parainfluenza Virus 4 NOT DETECTED NOT DETECTED Final   Respiratory Syncytial Virus NOT DETECTED NOT DETECTED Final   Bordetella pertussis NOT DETECTED NOT DETECTED Final   Bordetella Parapertussis NOT DETECTED NOT DETECTED Final   Chlamydophila pneumoniae NOT DETECTED NOT DETECTED Final   Mycoplasma pneumoniae NOT DETECTED NOT DETECTED Final    Comment: Performed at [facility], [place]. [address], [place], [place] [address]  Resp panel by RT-PCR (RSV, Flu A&B, Covid) Anterior Nasal Swab     Status: None   Collection Time: 06/01/22 12:16 PM   Specimen: Anterior Nasal Swab  Result Value Ref Range Status   SARS Coronavirus 2 by RT PCR NEGATIVE NEGATIVE Final    Comment: (NOTE) SARS-CoV-2 target nucleic acids are NOT DETECTED.  The SARS-CoV-2 RNA is generally detectable in upper respiratory specimens during the acute phase of infection. The lowest concentration of SARS-CoV-2 viral copies this assay can detect is 138 copies/mL. A negative result does not preclude SARS-Cov-2 infection and should not be used as the sole basis for treatment or other patient management decisions. A negative result may occur with  improper specimen collection/handling, submission of specimen other than nasopharyngeal swab, presence of viral mutation(s) within the areas targeted by this assay, and inadequate number of viral copies(<138 copies/mL). A negative result must be combined with clinical observations, patient history, and epidemiological information. The expected result is Negative.  Fact Sheet for Patients:  EntrepreneurPulse.com.au  Fact Sheet for Healthcare Providers:  IncredibleEmployment.be  This test is no t yet approved or cleared by the [place] FDA and  has been authorized for detection and/or diagnosis of SARS-CoV-2 by FDA under an Emergency Use  Authorization (EUA). This EUA will remain  in effect (meaning this test can be used) for the duration of the COVID-19 declaration under Section 564(b)(1) of the Act, 21 U.S.C.section 360bbb-3(b)(1), unless the authorization is terminated  or revoked sooner.       Influenza A by PCR NEGATIVE NEGATIVE Final   Influenza B by PCR NEGATIVE NEGATIVE Final    Comment: (NOTE) The Xpert Xpress SARS-CoV-2/FLU/RSV plus assay is intended as an aid in the diagnosis of influenza  from Nasopharyngeal swab specimens and should not be used as a sole basis for treatment. Nasal washings and aspirates are unacceptable for Xpert Xpress SARS-CoV-2/FLU/RSV testing.  Fact Sheet for Patients: EntrepreneurPulse.com.au  Fact Sheet for Healthcare Providers: IncredibleEmployment.be  This test is not yet approved or cleared by the [place] FDA and has been authorized for detection and/or diagnosis of SARS-CoV-2 by FDA under an Emergency Use Authorization (EUA). This EUA will remain in effect (meaning this test can be used) for the duration of the COVID-19 declaration under Section 564(b)(1) of the Act, 21 U.S.C. section 360bbb-3(b)(1), unless the authorization is terminated or revoked.     Resp Syncytial Virus by PCR NEGATIVE NEGATIVE Final    Comment: (NOTE) Fact Sheet for Patients: EntrepreneurPulse.com.au  Fact Sheet for Healthcare Providers: IncredibleEmployment.be  This test is not yet approved or cleared by the [place] FDA and has been authorized for detection and/or diagnosis of SARS-CoV-2 by FDA under an Emergency Use Authorization (EUA). This EUA will remain in effect (meaning this test can be used) for the duration of the COVID-19 declaration under Section 564(b)(1) of the Act, 21 U.S.C. section 360bbb-3(b)(1), unless the authorization is terminated or revoked.  Performed at [facility], [place] [address], [place], [place] [address]       Radiology Studies: No results found.    Scheduled Meds:   Continuous Infusions:  promethazine (PHENERGAN) injection (IM or IVPB) Stopped (06/01/22 1315)     LOS: 16 days   Time spent: 20 minutes   Dessa Phi, DO [facility] 06/09/2022, 11:19 AM   Available via Epic secure chat 7am-7pm After these hours, please refer to coverage provider listed on [email or web address]

## 2022-06-10 DIAGNOSIS — R0609 Other forms of dyspnea: Secondary | ICD-10-CM

## 2022-06-10 DIAGNOSIS — Z66 Do not resuscitate: Secondary | ICD-10-CM | POA: Diagnosis not present

## 2022-06-10 DIAGNOSIS — J9621 Acute and chronic respiratory failure with hypoxia: Secondary | ICD-10-CM | POA: Diagnosis not present

## 2022-06-10 MED ORDER — MORPHINE SULFATE (PF) 2 MG/ML IV SOLN
2.0000 mg | INTRAVENOUS | Status: DC | PRN
  Administered 2022-06-10 – 2022-06-11 (×4): 2 mg via INTRAVENOUS
  Filled 2022-06-10 (×3): qty 1

## 2022-06-10 NOTE — Progress Notes (Signed)
Patient ID: Veronica Stone, female   DOB: [birth year], 89 y.o.   MRN: [id number]    Progress Note from the Palliative Medicine Team at [facility]   Patient Name: Veronica Stone        Date: 06/10/2022 DOB: 1931-11-10  Age: 89 y.o. MRN#: [id number] Attending Physician: Dessa Phi, DO Primary Care Physician: Deland Pretty, MD Admit Date: 05/30/2022   Medical records reviewed, assessed patient   89 y.o. female   admitted on 06/06/2022 with   past medical history significant for pulmonary fibrosis on 4 L of oxygen at home on steroids, this is probably due to rheumatoid arthritis for which she is on Remicade, history of AAA, gastric ulcer, history of DVT comes in with shortness of breath that started about 2 weeks prior to admission cyanotic on arrival satting 80% in the ED placed on 12 L of oxygen, CT angio of the chest showed bilateral PE, new patchy groundglass opacity bilaterally.     Started on IV heparin and started having lower GI bleed, GI was consulted for EGD and colonoscopy performed.   EGD that showed nonbleeding gastric ulcer with clean base biopsies were taken.   Colonoscopy was done that showed malignant tumor of the sigmoid.   Biopsy-proven adenocarcinoma.  She is having ongoing bleeding without IV heparin, hematology was consulted recommended radiation therapy.     Acute on chronic respiratory failure with hypoxia.  Today is day 16 of this hospitalization.  Patient has had continued physical and functional decline, decision made by family to shift to full comfort allowing for natural death. .   This NP assessed patient at the bedside as a follow up for palliative medicine needs and emotional support.  Patient is minimally responsive, appears comfortable.   Family gathered at bedside to include patient's 3 daughters and a granddaughter.  Continued education regarding the natural trajectory and expectations at end-of-life.   Plan of Care: -DNR/DNI -Focus of care is  comfort and dignity- allowing for a natural death - decrease oxygen to 2 liters, do not escalate -medications per [facility] for symptom management        -education to family regarding use of morphine for dyspnea/air hunger, today family in agreement and verbalize understanding  -prognosis is likely hours to days    Grief support and counseling offered    Questions and concerns addressed   Discussed with Dr Maylene Roes  PMT will continue to support holistically  50 minutes- greater than 50 % spent in counseling and coordination of care   Wadie Lessen NP  Palliative Medicine Team Team Phone # [phone] Pager [phone]

## 2022-06-10 NOTE — Progress Notes (Signed)
Patients respirations are ranging from 30 to 36. Accessory muscles in use noted and chronic oral ventilation with mouth puffing. Visible chest rising, and continuous O2 [place] use.

## 2022-06-10 NOTE — Progress Notes (Signed)
PROGRESS NOTE    Veronica Stone  HYI:[id number] DOB: [birth year] DOA: 06/13/2022 PCP: Deland Pretty, MD     Brief Narrative:  Veronica Stone is an 89 year old female with pulmonary fibrosis and chronic respiratory failure on 4 L of oxygen, rheumatoid arthritis, gastric ulcer, abdominal aortic aneurysm, chronic diastolic heart failure, essential hypertension, DVT who presented to the hospital for shortness of breath for 2 weeks.  Pulse ox 80% in the ED.  CTA revealed bilateral PE and bilateral groundglass opacities.  Started on IV heparin and subsequently developed a lower GI bleed.  GI consulted and EGD and colonoscopy revealed nonbleeding gastric ulcer with a clean base and a malignant tumor in the sigmoid colon which was biopsied.  Biopsy revealed adenocarcinoma.  The patient continued to have lower GI bleeding and heparin was discontinued.  Despite discontinuing anticoagulation, she continued to have bleeding.  Which eventually stopped.  Radiation oncology was consulted along with med oncology and the plan for radiation was discussed.  On 1/15, she first developed sudden onset shortness of breath and worsening hypoxia.  Temperature was 102.7.  She was started on broad-spectrum antibiotics and received a dose of IV Lasix.  There was a concern for aspiration pneumonia. She did not improve with antibiotics or IV steroids.  Subsequent to this event, her oral intake remained poor. She expressed a desire for comfort care repeatedly and she was transitioned to full comfort measures 06/03/22.   New events last 24 hours / Subjective: Patient sleeping, does not arouse to voice. O2 has been titrated up to 5L, I turned it down to 4L. Granddaughter at bedside states she will leave any decisions regarding O2 and pulse ox to her aunt (pt's daughter). Seems to have more labored breathing today.   Assessment & Plan:   Principal Problem:   Acute on chronic respiratory failure with hypoxia (HCC) Active  Problems:   Colon cancer with bleeding   Essential hypertension   AAA (abdominal aortic aneurysm) without rupture (HCC)   Rheumatoid arthritis (HCC)   Pulmonary fibrosis (HCC)   Physical deconditioning   Pulmonary embolism without acute cor pulmonale (HCC)   History of rheumatoid arthritis   Dehydration   Elevated lactic acid level   Elevated troponin   Pressure injury of skin   Presence of IVC filter   Goals of care, counseling/discussion   DNR (do not resuscitate)   Continue full comfort measures.  Anticipate in-hospital death.  Palliative care medicine is following.   In agreement with assessment of the pressure ulcer as below:  Pressure Injury 05/30/22 Buttocks Left Stage 2 -  Partial thickness loss of dermis presenting as a shallow open injury with a red, pink wound bed without slough. very small stage 2 pink in color skin pink around it (Active)  05/30/22 1950  Location: Buttocks  Location Orientation: Left  Staging: Stage 2 -  Partial thickness loss of dermis presenting as a shallow open injury with a red, pink wound bed without slough.  Wound Description (Comments): very small stage 2 pink in color skin pink around it  Present on Admission:   Dressing Type Pressure dressing 06/09/22 1945     Family Communication: Granddaughter at bedside Disposition Plan:  Status is: Inpatient Remains inpatient appropriate because: Anticipate in-hospital death   Antimicrobials:  Anti-infectives (From admission, onward)    Start     Dose/Rate Route Frequency Ordered Stop   06/01/22 1000  vancomycin (VANCOCIN) IVPB 1000 mg/200 mL premix  Status:  Discontinued  1,000 mg 200 mL/hr over 60 Minutes Intravenous Every 36 hours 05/30/22 2124 06/02/22 1225   05/31/22 1000  cefTRIAXone (ROCEPHIN) 2 g in sodium chloride 0.9 % 100 mL IVPB  Status:  Discontinued        2 g 200 mL/hr over 30 Minutes Intravenous Every 24 hours 05/31/22 0904 06/02/22 1225   05/30/22 2215  vancomycin  (VANCOREADY) IVPB 1500 mg/300 mL        1,500 mg 150 mL/hr over 120 Minutes Intravenous  Once 05/30/22 2124 05/31/22 0158   05/30/22 2200  ceFEPIme (MAXIPIME) 2 g in sodium chloride 0.9 % 100 mL IVPB        2 g 200 mL/hr over 30 Minutes Intravenous  Once 05/30/22 2108 05/30/22 2329   05/30/22 2200  metroNIDAZOLE (FLAGYL) IVPB 500 mg  Status:  Discontinued        500 mg 100 mL/hr over 60 Minutes Intravenous Every 12 hours 05/30/22 2108 06/02/22 1225   05/30/22 2200  vancomycin (VANCOCIN) IVPB 1000 mg/200 mL premix  Status:  Discontinued        1,000 mg 200 mL/hr over 60 Minutes Intravenous  Once 05/30/22 2108 05/30/22 2111   05/30/22 2200  vancomycin (VANCOREADY) IVPB 1500 mg/300 mL        1,500 mg 150 mL/hr over 120 Minutes Intravenous  Once 05/30/22 2111 05/31/22 0155   05/30/22 2200  ceFEPIme (MAXIPIME) 2 g in sodium chloride 0.9 % 100 mL IVPB  Status:  Discontinued        2 g 200 mL/hr over 30 Minutes Intravenous Every 12 hours 05/30/22 2124 05/31/22 0904   05/27/22 0000  vancomycin (VANCOCIN) IVPB 1000 mg/200 mL premix  Status:  Discontinued        1,000 mg 200 mL/hr over 60 Minutes Intravenous Every 48 hours 05/25/22 0052 05/25/22 0150   05/26/22 0000  azithromycin (ZITHROMAX) 500 mg in sodium chloride 0.9 % 250 mL IVPB        500 mg 250 mL/hr over 60 Minutes Intravenous Every 24 hours 05/25/22 0102 05/30/22 0154   05/25/22 2200  ceFEPIme (MAXIPIME) 2 g in sodium chloride 0.9 % 100 mL IVPB  Status:  Discontinued        2 g 200 mL/hr over 30 Minutes Intravenous Every 24 hours 05/25/22 0052 05/25/22 0150   05/25/22 2200  cefTRIAXone (ROCEPHIN) 2 g in sodium chloride 0.9 % 100 mL IVPB        2 g 200 mL/hr over 30 Minutes Intravenous Every 24 hours 05/25/22 0102 05/28/22 2116   05/25/22 0000  ceFEPIme (MAXIPIME) 2 g in sodium chloride 0.9 % 100 mL IVPB        2 g 200 mL/hr over 30 Minutes Intravenous  Once 06/06/2022 2249 05/25/22 0011   05/23/2022 2315  metroNIDAZOLE (FLAGYL) IVPB 500  mg  Status:  Discontinued        500 mg 100 mL/hr over 60 Minutes Intravenous 2 times daily 06/07/2022 2246 05/25/22 0150   05/21/2022 2300  vancomycin (VANCOREADY) IVPB 1500 mg/300 mL        1,500 mg 150 mL/hr over 120 Minutes Intravenous  Once 05/28/2022 2249 05/25/22 0345   05/23/2022 2145  cefTRIAXone (ROCEPHIN) 1 g in sodium chloride 0.9 % 100 mL IVPB        1 g 200 mL/hr over 30 Minutes Intravenous  Once 05/31/2022 2131 05/23/2022 2305   05/21/2022 2145  azithromycin (ZITHROMAX) 500 mg in sodium chloride 0.9 % 250 mL IVPB  500 mg 250 mL/hr over 60 Minutes Intravenous  Once 06/08/2022 2131 05/25/22 0138        Objective: Vitals:   06/09/22 0500 06/09/22 2025 06/10/22 0507 06/10/22 0819  BP: (!) 140/85 134/69 134/64 129/73  Pulse: 94 97 95 99  Resp: 16 (!) 24 (!) 24 17  Temp: 99.3 F (37.4 C) 99.8 F (37.7 C)  99 F (37.2 C)  TempSrc: Oral Oral  Oral  SpO2: 92% 93% 98% 90%  Weight:      Height:       No intake or output data in the 24 hours ending 06/10/22 1240 Filed Weights   05/23/2022 1917  Weight: 79.8 kg    Examination:  General exam: Somnolent   Data Reviewed: I have personally reviewed following labs and imaging studies  CBC: No results for input(s): "WBC", "NEUTROABS", "HGB", "HCT", "MCV", "PLT" in the last 168 hours.  Basic Metabolic Panel: No results for input(s): "NA", "K", "CL", "CO2", "GLUCOSE", "BUN", "CREATININE", "CALCIUM", "MG", "PHOS" in the last 168 hours.  GFR: Estimated Creatinine Clearance: 38.2 mL/min (A) (by C-G formula based on SCr of 1.02 mg/dL (H)). Liver Function Tests: No results for input(s): "AST", "ALT", "ALKPHOS", "BILITOT", "PROT", "ALBUMIN" in the last 168 hours. No results for input(s): "LIPASE", "AMYLASE" in the last 168 hours. No results for input(s): "AMMONIA" in the last 168 hours. Coagulation Profile: No results for input(s): "INR", "PROTIME" in the last 168 hours. Cardiac Enzymes: No results for input(s): "CKTOTAL", "CKMB",  "CKMBINDEX", "TROPONINI" in the last 168 hours. BNP (last 3 results) No results for input(s): "PROBNP" in the last 8760 hours. HbA1C: No results for input(s): "HGBA1C" in the last 72 hours. CBG: No results for input(s): "GLUCAP" in the last 168 hours. Lipid Profile: No results for input(s): "CHOL", "HDL", "LDLCALC", "TRIG", "CHOLHDL", "LDLDIRECT" in the last 72 hours. Thyroid Function Tests: No results for input(s): "TSH", "T4TOTAL", "FREET4", "T3FREE", "THYROIDAB" in the last 72 hours. Anemia Panel: No results for input(s): "VITAMINB12", "FOLATE", "FERRITIN", "TIBC", "IRON", "RETICCTPCT" in the last 72 hours. Sepsis Labs: No results for input(s): "PROCALCITON", "LATICACIDVEN" in the last 168 hours.  Recent Results (from the past 240 hour(s))  MRSA Next Gen by PCR, Nasal     Status: None   Collection Time: 06/01/22  8:06 AM   Specimen: Nasal Mucosa; Nasal Swab  Result Value Ref Range Status   MRSA by PCR Next Gen NOT DETECTED NOT DETECTED Final    Comment: (NOTE) The GeneXpert MRSA Assay (FDA approved for NASAL specimens only), is one component of a comprehensive MRSA colonization surveillance program. It is not intended to diagnose MRSA infection nor to guide or monitor treatment for MRSA infections. Test performance is not FDA approved in patients less than 50 years old. Performed at [facility], [place] [address], [place], [place] [address]   Respiratory (~20 pathogens) panel by PCR     Status: None   Collection Time: 06/01/22 12:16 PM   Specimen: Nasopharyngeal Swab; Respiratory  Result Value Ref Range Status   Adenovirus NOT DETECTED NOT DETECTED Final   Coronavirus 229E NOT DETECTED NOT DETECTED Final    Comment: (NOTE) The Coronavirus on the Respiratory Panel, DOES NOT test for the novel  Coronavirus (2019 nCoV)    Coronavirus HKU1 NOT DETECTED NOT DETECTED Final   Coronavirus NL63 NOT DETECTED NOT DETECTED Final   Coronavirus OC43 NOT DETECTED NOT DETECTED Final    Metapneumovirus NOT DETECTED NOT DETECTED Final   Rhinovirus / Enterovirus NOT  DETECTED NOT DETECTED Final   Influenza A NOT DETECTED NOT DETECTED Final   Influenza B NOT DETECTED NOT DETECTED Final   Parainfluenza Virus 1 NOT DETECTED NOT DETECTED Final   Parainfluenza Virus 2 NOT DETECTED NOT DETECTED Final   Parainfluenza Virus 3 NOT DETECTED NOT DETECTED Final   Parainfluenza Virus 4 NOT DETECTED NOT DETECTED Final   Respiratory Syncytial Virus NOT DETECTED NOT DETECTED Final   Bordetella pertussis NOT DETECTED NOT DETECTED Final   Bordetella Parapertussis NOT DETECTED NOT DETECTED Final   Chlamydophila pneumoniae NOT DETECTED NOT DETECTED Final   Mycoplasma pneumoniae NOT DETECTED NOT DETECTED Final    Comment: Performed at [facility], [place] [address] [place], [place] [address]  Resp panel by RT-PCR (RSV, Flu A&B, Covid) Anterior Nasal Swab     Status: None   Collection Time: 06/01/22 12:16 PM   Specimen: Anterior Nasal Swab  Result Value Ref Range Status   SARS Coronavirus 2 by RT PCR NEGATIVE NEGATIVE Final    Comment: (NOTE) SARS-CoV-2 target nucleic acids are NOT DETECTED.  The SARS-CoV-2 RNA is generally detectable in upper respiratory specimens during the acute phase of infection. The lowest concentration of SARS-CoV-2 viral copies this assay can detect is 138 copies/mL. A negative result does not preclude SARS-Cov-2 infection and should not be used as the sole basis for treatment or other patient management decisions. A negative result may occur with  improper specimen collection/handling, submission of specimen other than nasopharyngeal swab, presence of viral mutation(s) within the areas targeted by this assay, and inadequate number of viral copies(<138 copies/mL). A negative result must be combined with clinical observations, patient history, and epidemiological information. The expected result is Negative.  Fact Sheet for Patients:   EntrepreneurPulse.com.au  Fact Sheet for Healthcare Providers:  IncredibleEmployment.be  This test is no t yet approved or cleared by the [place] FDA and  has been authorized for detection and/or diagnosis of SARS-CoV-2 by FDA under an Emergency Use Authorization (EUA). This EUA will remain  in effect (meaning this test can be used) for the duration of the COVID-19 declaration under Section 564(b)(1) of the Act, 21 U.S.C.section 360bbb-3(b)(1), unless the authorization is terminated  or revoked sooner.       Influenza A by PCR NEGATIVE NEGATIVE Final   Influenza B by PCR NEGATIVE NEGATIVE Final    Comment: (NOTE) The Xpert Xpress SARS-CoV-2/FLU/RSV plus assay is intended as an aid in the diagnosis of influenza from Nasopharyngeal swab specimens and should not be used as a sole basis for treatment. Nasal washings and aspirates are unacceptable for Xpert Xpress SARS-CoV-2/FLU/RSV testing.  Fact Sheet for Patients: EntrepreneurPulse.com.au  Fact Sheet for Healthcare Providers: IncredibleEmployment.be  This test is not yet approved or cleared by the [place] FDA and has been authorized for detection and/or diagnosis of SARS-CoV-2 by FDA under an Emergency Use Authorization (EUA). This EUA will remain in effect (meaning this test can be used) for the duration of the COVID-19 declaration under Section 564(b)(1) of the Act, 21 U.S.C. section 360bbb-3(b)(1), unless the authorization is terminated or revoked.     Resp Syncytial Virus by PCR NEGATIVE NEGATIVE Final    Comment: (NOTE) Fact Sheet for Patients: EntrepreneurPulse.com.au  Fact Sheet for Healthcare Providers: IncredibleEmployment.be  This test is not yet approved or cleared by the [place] FDA and has been authorized for detection and/or diagnosis of SARS-CoV-2 by FDA under an Emergency Use  Authorization (EUA). This EUA will remain in effect (  meaning this test can be used) for the duration of the COVID-19 declaration under Section 564(b)(1) of the Act, 21 U.S.C. section 360bbb-3(b)(1), unless the authorization is terminated or revoked.  Performed at [facility], [place] [address] [place], [place] [address]       Radiology Studies: No results found.    Scheduled Meds:   Continuous Infusions:  promethazine (PHENERGAN) injection (IM or IVPB) Stopped (06/01/22 1315)     LOS: 17 days   Time spent: 20 minutes   Veronica Phi, DO [facility] 06/10/2022, 12:40 PM   Available via Epic secure chat 7am-7pm After these hours, please refer to coverage provider listed on [email or web address]

## 2022-06-11 DIAGNOSIS — J9621 Acute and chronic respiratory failure with hypoxia: Secondary | ICD-10-CM | POA: Diagnosis not present

## 2022-06-11 MED ORDER — MORPHINE 100MG IN NS 100ML (1MG/ML) PREMIX INFUSION
1.0000 mg/h | INTRAVENOUS | Status: DC
  Administered 2022-06-11: 1 mg/h via INTRAVENOUS
  Filled 2022-06-11: qty 100

## 2022-06-11 MED ORDER — MORPHINE SULFATE (PF) 2 MG/ML IV SOLN
1.0000 mg | INTRAVENOUS | Status: DC | PRN
  Administered 2022-06-11: 1 mg via INTRAVENOUS
  Filled 2022-06-11: qty 1

## 2022-06-16 NOTE — Death Summary Note (Signed)
DEATH SUMMARY   Patient Details  Name: Veronica Stone MRN: [id number] DOB: 18-Dec-1931 KY[facility]:[id number], Thayer Jew, MD Admission/Discharge Information   Admit Date:  05-Jun-2022  Date of Death: Date of Death: 06-23-22  Time of Death: Time of Death: 33  Length of Stay: 2022-08-13   Principle Cause of death: Acute on chronic respiratory failure with hypoxia   Hospital Diagnoses: Principal Problem:   Acute on chronic respiratory failure with hypoxia ([place]) Active Problems:   Colon cancer with bleeding   Essential hypertension   AAA (abdominal aortic aneurysm) without rupture (HCC)   Rheumatoid arthritis ([place])   Pulmonary fibrosis ([place])   Physical deconditioning   Pulmonary embolism without acute cor pulmonale (HCC)   History of rheumatoid arthritis   Dehydration   Elevated lactic acid level   Elevated troponin   Pressure injury of skin   Presence of IVC filter   Goals of care, counseling/discussion   DNR (do not resuscitate)   Veronica Stone is an 89 year old female with pulmonary fibrosis and chronic respiratory failure on 4 L of oxygen, rheumatoid arthritis, gastric ulcer, abdominal aortic aneurysm, chronic diastolic heart failure, essential hypertension, DVT who presented to the hospital for shortness of breath for 2 weeks.  Pulse ox 80% in the ED.  CTA revealed bilateral PE and bilateral groundglass opacities.  Started on IV heparin and subsequently developed a lower GI bleed.  GI consulted and EGD and colonoscopy revealed nonbleeding gastric ulcer with a clean base and a malignant tumor in the sigmoid colon which was biopsied.  Biopsy revealed adenocarcinoma.  The patient continued to have lower GI bleeding and heparin was discontinued.  Despite discontinuing anticoagulation, she continued to have bleeding.  Which eventually stopped.  Radiation oncology was consulted along with med oncology and the plan for radiation was discussed.  On 1/15, she first developed sudden onset shortness of  breath and worsening hypoxia.  Temperature was 102.7.  She was started on broad-spectrum antibiotics and received a dose of IV Lasix.  There was a concern for aspiration pneumonia. She did not improve with antibiotics or IV steroids.  Subsequent to this event, her oral intake remained poor. She expressed a desire for comfort care repeatedly and she was transitioned to full comfort measures 06/03/22.         The results of significant diagnostics from this hospitalization (including imaging, microbiology, ancillary and laboratory) are listed below for reference.   Significant Diagnostic Studies: DG Chest Port 1 View  Result Date: 06/02/2022 CLINICAL DATA:  [id number] Hypoxia [id number] EXAM: PORTABLE CHEST 1 VIEW COMPARISON:  05/30/2022 chest radiograph. FINDINGS: Stable cardiomediastinal silhouette with heart size. No pneumothorax. No pleural effusion. Extensive patchy confluent coarse reticular and hazy opacities throughout both lungs, unchanged. No acute consolidative airspace disease. IMPRESSION: Stable extensive patchy confluent coarse reticular and hazy opacities throughout both lungs, compatible with chronic fibrotic interstitial lung disease. No convincing superimposed acute cardiopulmonary process. Electronically Signed   By: Ilona Sorrel M.D.   On: 06/02/2022 08:25   IR IVC FILTER PLMT / S&I Burke Keels GUID/MOD SED  Result Date: 05/31/2022 CLINICAL DATA:  89 year old female with history of deep vein thrombosis and pulmonary embolism, unable to tolerate anticoagulation secondary to gastrointestinal hemorrhage. EXAM: 1. ULTRASOUND GUIDANCE FOR VASCULAR ACCESS OF THE RIGHT internal jugular VEIN. 2. IVC VENOGRAM. 3. PERCUTANEOUS IVC FILTER PLACEMENT. ANESTHESIA/SEDATION: None. CONTRAST:  43m OMNIPAQUE IOHEXOL 300 MG/ML  SOLN FLUOROSCOPY TIME:  Thirty-six mGy PROCEDURE: The procedure, risks, benefits, and alternatives were explained to the patient. Questions regarding  the procedure were encouraged and  answered. The patient understands and consents to the procedure. The patient was prepped with Betadine in a sterile fashion, and a sterile drape was applied covering the operative field. A sterile gown and sterile gloves were used for the procedure. Local anesthesia was provided with 1% Lidocaine. Under direct ultrasound guidance, a 21 gauge needle was advanced into the right internal jugular vein with ultrasound image documentation performed. After securing access with a micropuncture dilator, a guidewire was advanced into the inferior vena cava. A deployment sheath was advanced over the guidewire. This was utilized to perform IVC venography. The deployment sheath was further positioned in an appropriate location for filter deployment. A Denali IVC filter was then advanced in the sheath. This was then fully deployed in the infrarenal IVC. Final filter position was confirmed with a fluoroscopic spot image. Contrast injection was also performed through the sheath under fluoroscopy to confirm patency of the IVC at the level of the filter. After the procedure the sheath was removed and hemostasis obtained with manual compression. COMPLICATIONS: None. FINDINGS: IVC venography demonstrates a normal caliber IVC with no evidence of thrombus. Renal veins are identified bilaterally. The IVC filter was successfully positioned below the level of the renal veins and is appropriately oriented. This IVC filter has both permanent and retrievable indications. IMPRESSION: Placement of percutaneous IVC filter in infrarenal IVC. IVC venogram shows no evidence of IVC thrombus and normal caliber of the inferior vena cava. This filter does have both permanent and retrievable indications. PLAN: This IVC filter is potentially retrievable. The patient will be assessed for filter retrieval by Interventional Radiology in approximately 8-12 weeks. Further recommendations regarding filter retrieval, continued surveillance or declaration of  device permanence, will be made at that time. Ruthann Cancer, MD [facility] Radiology Electronically Signed   By: Ruthann Cancer M.D.   On: 05/31/2022 08:12   DG Chest Port 1 View  Result Date: 05/30/2022 CLINICAL DATA:  [id number] Acute respiratory distress [id number] EXAM: PORTABLE CHEST 1 VIEW COMPARISON:  Chest x-ray 05/16/2022, CT angio chest 05/30/2022 FINDINGS: The heart and mediastinal contours are within normal limits. Atherosclerotic plaque. Persistent vague upper lobe airspace opacities. Chronic coarsened markings no overt pulmonary edema. No pleural effusion. No pneumothorax. No acute osseous abnormality. IMPRESSION: 1. Persistent vague upper lobe airspace opacities with underlying pulmonary fibrosis. Followup PA and lateral chest X-ray is recommended in 3-4 weeks following therapy to ensure resolution. 2.  Aortic Atherosclerosis (ICD10-I70.0). Electronically Signed   By: Iven Finn M.D.   On: 05/30/2022 20:55   VAS [place] LOWER EXTREMITY VENOUS (DVT)  Result Date: 05/25/2022  Lower Venous DVT Study Patient Name:  Veronica Stone  Date of Exam:   05/25/2022 Medical Rec #: [id number]         Accession #:    [id number] Date of Birth: 21-Jun-1931         Patient Gender: F Patient Age:   46 years Exam Location:  [facility] Procedure:      VAS [place] LOWER EXTREMITY VENOUS (DVT) Referring Phys: Nyoka Lint DOUTOVA --------------------------------------------------------------------------------  Indications: Pulmonary embolism.  Anticoagulation: Heparin. Comparison Study: No prior studies. Performing Technologist: Darlin Coco RDMS, RVT  Examination Guidelines: A complete evaluation includes B-mode imaging, spectral Doppler, color Doppler, and power Doppler as needed of all accessible portions of each vessel. Bilateral testing is considered an integral part of a complete examination. Limited examinations for reoccurring indications may be performed as noted. The  reflux portion  of the exam is performed with the patient in reverse Trendelenburg.  +---------+---------------+---------+-----------+----------+--------------+ RIGHT    CompressibilityPhasicitySpontaneityPropertiesThrombus Aging +---------+---------------+---------+-----------+----------+--------------+ CFV      Full           Yes      Yes                                 +---------+---------------+---------+-----------+----------+--------------+ SFJ      Full                                                        +---------+---------------+---------+-----------+----------+--------------+ FV Prox  Full                                                        +---------+---------------+---------+-----------+----------+--------------+ FV Mid   Full                                                        +---------+---------------+---------+-----------+----------+--------------+ FV DistalFull                                                        +---------+---------------+---------+-----------+----------+--------------+ PFV      Full                                                        +---------+---------------+---------+-----------+----------+--------------+ POP      Full           Yes      Yes                                 +---------+---------------+---------+-----------+----------+--------------+ PTV      Full                                                        +---------+---------------+---------+-----------+----------+--------------+ PERO     Full                                                        +---------+---------------+---------+-----------+----------+--------------+ Gastroc  Partial        Yes      Yes                  Acute          +---------+---------------+---------+-----------+----------+--------------+   +---------+---------------+---------+-----------+----------+--------------+  LEFT      CompressibilityPhasicitySpontaneityPropertiesThrombus Aging +---------+---------------+---------+-----------+----------+--------------+ CFV      Full           Yes      Yes                                 +---------+---------------+---------+-----------+----------+--------------+ SFJ      Full                                                        +---------+---------------+---------+-----------+----------+--------------+ FV Prox  Full                                                        +---------+---------------+---------+-----------+----------+--------------+ FV Mid   Full                                                        +---------+---------------+---------+-----------+----------+--------------+ FV DistalFull                                                        +---------+---------------+---------+-----------+----------+--------------+ PFV      Full                                                        +---------+---------------+---------+-----------+----------+--------------+ POP      Full           Yes      Yes                                 +---------+---------------+---------+-----------+----------+--------------+ PTV      Full                                                        +---------+---------------+---------+-----------+----------+--------------+ PERO     None           No       No                   Acute          +---------+---------------+---------+-----------+----------+--------------+ Gastroc  Full                                                        +---------+---------------+---------+-----------+----------+--------------+  Summary: RIGHT: - Findings consistent with acute deep vein thrombosis involving the right gastrocnemius veins. - No cystic structure found in the popliteal fossa.  LEFT: - Findings consistent with acute deep vein thrombosis involving the left peroneal veins. - No cystic structure  found in the popliteal fossa.  *See table(s) above for measurements and observations. Electronically signed by Servando Snare MD on 05/25/2022 at 5:24:19 PM.    Final    ECHOCARDIOGRAM COMPLETE  Result Date: 05/25/2022    ECHOCARDIOGRAM REPORT   Patient Name:   Veronica Stone Date of Exam: 05/25/2022 Medical Rec #:  [id number]        Height:       63.0 in Accession #:    [id number]       Weight:       175.9 lb Date of Birth:  [birth year]        BSA:          1.831 m Patient Age:    40 years         BP:           103/69 mmHg Patient Gender: F                HR:           87 bpm. Exam Location:  Inpatient Procedure: 2D Echo, Cardiac Doppler and Color Doppler Indications:    I26.02 Pulmonary embolus  History:        Patient has prior history of Echocardiogram examinations, most                 recent 12/06/2005. Signs/Symptoms:Shortness of Breath, Dyspnea,                 Chest Pain and Bacteremia; Risk Factors:Hypertension and Former                 Smoker.  Sonographer:    Roseanna Rainbow RDCS Referring Phys: Toy Baker  Sonographer Comments: Technically difficult study due to poor echo windows. IMPRESSIONS  1. Left ventricular ejection fraction, by estimation, is 65 to 70%. The left ventricle has normal function. The left ventricle has no regional wall motion abnormalities. There is mild left ventricular hypertrophy. Left ventricular diastolic parameters are consistent with Grade I diastolic dysfunction (impaired relaxation). There is the interventricular septum is flattened in systole, consistent with right ventricular pressure overload.  2. Right ventricular systolic function is moderately reduced. The right ventricular size is severely enlarged. There is moderately elevated pulmonary artery systolic pressure. The estimated right ventricular systolic pressure is 67.8 mmHg.  3. Tricuspid valve regurgitation is severe.  4. The mitral valve is grossly normal. Trivial mitral valve regurgitation. No evidence of  mitral stenosis.  5. The aortic valve is grossly normal. There is mild calcification of the aortic valve. Aortic valve regurgitation is not visualized. No aortic stenosis is present.  6. A small pericardial effusion is present. The pericardial effusion is posterior to the left ventricle.  7. The inferior vena cava is normal in size with greater than 50% respiratory variability, suggesting right atrial pressure of 3 mmHg.  8. Right atrial size was mildly dilated. FINDINGS  Left Ventricle: Left ventricular ejection fraction, by estimation, is 65 to 70%. The left ventricle has normal function. The left ventricle has no regional wall motion abnormalities. The left ventricular internal cavity size was normal in size. There is  mild left ventricular hypertrophy. The interventricular septum is flattened in systole, consistent with right ventricular pressure overload. Left  ventricular diastolic parameters are consistent with Grade I diastolic dysfunction (impaired relaxation). Right Ventricle: The right ventricular size is severely enlarged. No increase in right ventricular wall thickness. Right ventricular systolic function is moderately reduced. There is moderately elevated pulmonary artery systolic pressure. The tricuspid regurgitant velocity is 3.31 m/s, and with an assumed right atrial pressure of 15 mmHg, the estimated right ventricular systolic pressure is 23.5 mmHg. Left Atrium: Left atrial size was normal in size. Right Atrium: Right atrial size was mildly dilated. Pericardium: A small pericardial effusion is present. The pericardial effusion is posterior to the left ventricle. Presence of epicardial fat layer. Mitral Valve: The mitral valve is grossly normal. Trivial mitral valve regurgitation. No evidence of mitral valve stenosis. Tricuspid Valve: The tricuspid valve is normal in structure. Tricuspid valve regurgitation is severe. No evidence of tricuspid stenosis. Aortic Valve: The aortic valve is grossly  normal. There is mild calcification of the aortic valve. Aortic valve regurgitation is not visualized. No aortic stenosis is present. Pulmonic Valve: The pulmonic valve was normal in structure. Pulmonic valve regurgitation is mild. No evidence of pulmonic stenosis. Aorta: The aortic root is normal in size and structure. Ascending aorta measurements are within normal limits for age when indexed to body surface area. Venous: The inferior vena cava is normal in size with greater than 50% respiratory variability, suggesting right atrial pressure of 3 mmHg. IAS/Shunts: No atrial level shunt detected by color flow Doppler.  LEFT VENTRICLE PLAX 2D LVIDd:         3.30 cm   Diastology LVIDs:         1.80 cm   LV e' medial:    2.94 cm/s LV PW:         1.00 cm   LV E/e' medial:  21.7 LV IVS:        1.10 cm   LV e' lateral:   5.35 cm/s LVOT diam:     2.20 cm   LV E/e' lateral: 11.9 LV SV:         66 LV SV Index:   36 LVOT Area:     3.80 cm  RIGHT VENTRICLE             IVC RV S prime:     12.10 cm/s  IVC diam: 2.25 cm RVOT diam:      2.30 cm TAPSE (M-mode): 1.3 cm LEFT ATRIUM             Index       RIGHT ATRIUM           Index LA diam:        2.70 cm 1.47 cm/m  RA Area:     18.20 cm LA Vol (A2C):   17.7 ml 9.67 ml/m  RA Volume:   52.80 ml  28.84 ml/m LA Vol (A4C):   17.9 ml 9.78 ml/m LA Biplane Vol: 17.9 ml 9.78 ml/m  AORTIC VALVE LVOT Vmax:   111.00 cm/s LVOT Vmean:  71.900 cm/s LVOT VTI:    0.173 m  AORTA Ao Root diam: 3.30 cm Ao Asc diam:  3.80 cm MITRAL VALVE                TRICUSPID VALVE MV Area (PHT): 3.99 cm     TR Peak grad:   43.8 mmHg MV Decel Time: 190 msec     TR Vmax:        331.00 cm/s MV E velocity: 63.90 cm/s MV A velocity: 111.00 cm/s  SHUNTS MV E/A ratio:  0.58         Systemic VTI:  0.17 m                             Systemic Diam: 2.20 cm                             Pulmonic Diam: 2.30 cm Cherlynn Kaiser MD Electronically signed by Cherlynn Kaiser MD Signature Date/Time: 05/25/2022/3:36:05 PM     Final    CT Angio Chest PE W and/or Wo Contrast  Addendum Date: 06/06/2022   ADDENDUM REPORT: 06/09/2022 23:49 ADDENDUM: These results were called by telephone at the time of interpretation on 06/14/2022 at 11:49 pm to provider Dr. Roel Cluck, who verbally acknowledged these results. Electronically Signed   By: Ronney Asters M.D.   On: 06/02/2022 23:49   Result Date: 05/19/2022 CLINICAL DATA:  Shortness of breath and chills.  Abdominal pain. EXAM: CT ANGIOGRAPHY CHEST CT ABDOMEN AND PELVIS WITH CONTRAST TECHNIQUE: Multidetector CT imaging of the chest was performed using the standard protocol during bolus administration of intravenous contrast. Multiplanar CT image reconstructions and MIPs were obtained to evaluate the vascular anatomy. Multidetector CT imaging of the abdomen and pelvis was performed using the standard protocol during bolus administration of intravenous contrast. RADIATION DOSE REDUCTION: This exam was performed according to the departmental dose-optimization program which includes automated exposure control, adjustment of the mA and/or kV according to patient size and/or use of iterative reconstruction technique. CONTRAST:  64m OMNIPAQUE IOHEXOL 350 MG/ML SOLN COMPARISON:  CT chest 06/06/2016.  Lumbar spine x-ray 12/09/2011. FINDINGS: CTA CHEST FINDINGS Cardiovascular: Heart is mildly enlarged. Aorta is normal in size. There are atherosclerotic calcifications of the aorta. There is adequate opacification of the pulmonary arteries to the segmental level. There are lobar and segmental left upper lobe and right upper lobe pulmonary emboli. Mediastinum/Nodes: No enlarged mediastinal, hilar, or axillary lymph nodes. Thyroid gland, trachea, and esophagus demonstrate no significant findings. Lungs/Pleura: Fibrotic and interstitial opacities are again seen throughout both lungs, increased compared to 2018. There are new patchy ground-glass opacities in the bilateral upper lobes. There is no pleural  effusion or pneumothorax. Trachea and central airways are patent. Musculoskeletal: No chest wall abnormality. No acute or significant osseous findings. Review of the MIP images confirms the above findings. CT ABDOMEN and PELVIS FINDINGS Hepatobiliary: No focal liver abnormality is seen. Status post cholecystectomy. No biliary dilatation. Pancreas: Unremarkable. No pancreatic ductal dilatation or surrounding inflammatory changes. Spleen: Normal in size without focal abnormality. Adrenals/Urinary Tract: There are subcentimeter cysts in both kidneys. Otherwise, the kidneys, adrenal glands and bladder are within normal limits. Stomach/Bowel: There is a small hiatal hernia. Stomach is otherwise within normal limits. Appendix is not seen. No evidence of bowel wall thickening, distention, or inflammatory changes. There is sigmoid colon diverticulosis. Vascular/Lymphatic: Aortic atherosclerosis. No enlarged abdominal or pelvic lymph nodes. Reproductive: Status post hysterectomy. No adnexal masses. Other: No abdominal wall hernia or abnormality. No abdominopelvic ascites. Musculoskeletal: L1 compression deformity has mildly progressed compared to 2013. Review of the MIP images confirms the above findings. IMPRESSION: 1. Bilateral upper lobe lobar and segmental pulmonary emboli. Positive for acute PE with CTevidence of right heart strain (RV/LV Ratio = 3.2) consistent with at least submassive (intermediate risk) PE. The presence of right heart strain has been associated with an increased risk of morbidity and mortality. 2.  New patchy ground-glass opacities in the bilateral upper lobes, likely infectious/inflammatory. 3. Findings compatible with chronic interstitial lung disease, increased compared to 2018. 4. No acute localizing process in the abdomen or pelvis. 5. Sigmoid colon diverticulosis. Aortic Atherosclerosis (ICD10-I70.0). Electronically Signed: By: Ronney Asters M.D. On: 05/31/2022 23:40   CT ABDOMEN PELVIS W  CONTRAST  Addendum Date: 06/01/2022   ADDENDUM REPORT: 06/06/2022 23:49 ADDENDUM: These results were called by telephone at the time of interpretation on 06/01/2022 at 11:49 pm to provider Dr. Roel Cluck, who verbally acknowledged these results. Electronically Signed   By: Ronney Asters M.D.   On: 06/05/2022 23:49   Result Date: 05/27/2022 CLINICAL DATA:  Shortness of breath and chills.  Abdominal pain. EXAM: CT ANGIOGRAPHY CHEST CT ABDOMEN AND PELVIS WITH CONTRAST TECHNIQUE: Multidetector CT imaging of the chest was performed using the standard protocol during bolus administration of intravenous contrast. Multiplanar CT image reconstructions and MIPs were obtained to evaluate the vascular anatomy. Multidetector CT imaging of the abdomen and pelvis was performed using the standard protocol during bolus administration of intravenous contrast. RADIATION DOSE REDUCTION: This exam was performed according to the departmental dose-optimization program which includes automated exposure control, adjustment of the mA and/or kV according to patient size and/or use of iterative reconstruction technique. CONTRAST:  68m OMNIPAQUE IOHEXOL 350 MG/ML SOLN COMPARISON:  CT chest 06/06/2016.  Lumbar spine x-ray 12/09/2011. FINDINGS: CTA CHEST FINDINGS Cardiovascular: Heart is mildly enlarged. Aorta is normal in size. There are atherosclerotic calcifications of the aorta. There is adequate opacification of the pulmonary arteries to the segmental level. There are lobar and segmental left upper lobe and right upper lobe pulmonary emboli. Mediastinum/Nodes: No enlarged mediastinal, hilar, or axillary lymph nodes. Thyroid gland, trachea, and esophagus demonstrate no significant findings. Lungs/Pleura: Fibrotic and interstitial opacities are again seen throughout both lungs, increased compared to 2018. There are new patchy ground-glass opacities in the bilateral upper lobes. There is no pleural effusion or pneumothorax. Trachea and central  airways are patent. Musculoskeletal: No chest wall abnormality. No acute or significant osseous findings. Review of the MIP images confirms the above findings. CT ABDOMEN and PELVIS FINDINGS Hepatobiliary: No focal liver abnormality is seen. Status post cholecystectomy. No biliary dilatation. Pancreas: Unremarkable. No pancreatic ductal dilatation or surrounding inflammatory changes. Spleen: Normal in size without focal abnormality. Adrenals/Urinary Tract: There are subcentimeter cysts in both kidneys. Otherwise, the kidneys, adrenal glands and bladder are within normal limits. Stomach/Bowel: There is a small hiatal hernia. Stomach is otherwise within normal limits. Appendix is not seen. No evidence of bowel wall thickening, distention, or inflammatory changes. There is sigmoid colon diverticulosis. Vascular/Lymphatic: Aortic atherosclerosis. No enlarged abdominal or pelvic lymph nodes. Reproductive: Status post hysterectomy. No adnexal masses. Other: No abdominal wall hernia or abnormality. No abdominopelvic ascites. Musculoskeletal: L1 compression deformity has mildly progressed compared to 2013. Review of the MIP images confirms the above findings. IMPRESSION: 1. Bilateral upper lobe lobar and segmental pulmonary emboli. Positive for acute PE with CTevidence of right heart strain (RV/LV Ratio = 3.2) consistent with at least submassive (intermediate risk) PE. The presence of right heart strain has been associated with an increased risk of morbidity and mortality. 2. New patchy ground-glass opacities in the bilateral upper lobes, likely infectious/inflammatory. 3. Findings compatible with chronic interstitial lung disease, increased compared to 2018. 4. No acute localizing process in the abdomen or pelvis. 5. Sigmoid colon diverticulosis. Aortic Atherosclerosis (ICD10-I70.0). Electronically Signed: By: ARonney AstersM.D. On: 06/13/2022 23:40   DG Chest  Port 1 View  Result Date: 06/03/2022 CLINICAL DATA:  SOB  EXAM: PORTABLE CHEST 1 VIEW COMPARISON:  02/24/2021 FINDINGS: Cardiac silhouette is enlarged. Pulmonary interstitial changes consistent with fibrosis similar to the previous examination. No pneumothorax. Calcified aorta. No definite pleural effusions. IMPRESSION: Pulmonary fibrosis.  Prominent cardiac silhouette. Electronically Signed   By: Sammie Bench M.D.   On: 05/23/2022 19:44    Microbiology: No results found for this or any previous visit (from the past 240 hour(s)).    Signed: Dessa Phi, DO Jul 08, 2022

## 2022-06-16 NOTE — Progress Notes (Signed)
   06-21-22 1300  Spiritual Encounters  Type of Visit Initial  Care provided to: [facility] partners present during encounter Nurse  Referral source Family;Nurse (RN/NT/LPN)  Reason for visit Patient death  OnCall Visit Yes   Chaplain responded to patient's death. Chaplain met with two of the patient's daughters. Daughters stated the family wishes to use [facility], [facility], [facility] in [place]. Chaplain relayed that information to the RN. Chaplain offered prayer and support.Chaplain introduced spiritual care services. Spiritual care services available as needed.   Jeri Lager, Chaplain 2022/06/21

## 2022-06-16 NOTE — Progress Notes (Signed)
PROGRESS NOTE    Veronica Stone  GGE:[id number] DOB: [birth year] DOA: 06/05/2022 PCP: Deland Pretty, MD     Brief Narrative:  Veronica Stone is an 89 year old female with pulmonary fibrosis and chronic respiratory failure on 4 L of oxygen, rheumatoid arthritis, gastric ulcer, abdominal aortic aneurysm, chronic diastolic heart failure, essential hypertension, DVT who presented to the hospital for shortness of breath for 2 weeks.  Pulse ox 80% in the ED.  CTA revealed bilateral PE and bilateral groundglass opacities.  Started on IV heparin and subsequently developed a lower GI bleed.  GI consulted and EGD and colonoscopy revealed nonbleeding gastric ulcer with a clean base and a malignant tumor in the sigmoid colon which was biopsied.  Biopsy revealed adenocarcinoma.  The patient continued to have lower GI bleeding and heparin was discontinued.  Despite discontinuing anticoagulation, she continued to have bleeding.  Which eventually stopped.  Radiation oncology was consulted along with med oncology and the plan for radiation was discussed.  On 1/15, she first developed sudden onset shortness of breath and worsening hypoxia.  Temperature was 102.7.  She was started on broad-spectrum antibiotics and received a dose of IV Lasix.  There was a concern for aspiration pneumonia. She did not improve with antibiotics or IV steroids.  Subsequent to this event, her oral intake remained poor. She expressed a desire for comfort care repeatedly and she was transitioned to full comfort measures 06/03/22.   New events last 24 hours / Subjective: Patient sleeping, moaning and groaning.  Daughter at bedside states that patient has been this way overnight.  Has been unarousable today.  Has evidence of abdominal breathing as well.  Assessment & Plan:   Principal Problem:   Acute on chronic respiratory failure with hypoxia (HCC) Active Problems:   Colon cancer with bleeding   Essential hypertension   AAA (abdominal  aortic aneurysm) without rupture (HCC)   Rheumatoid arthritis (HCC)   Pulmonary fibrosis (HCC)   Physical deconditioning   Pulmonary embolism without acute cor pulmonale (HCC)   History of rheumatoid arthritis   Dehydration   Elevated lactic acid level   Elevated troponin   Pressure injury of skin   Presence of IVC filter   Goals of care, counseling/discussion   DNR (do not resuscitate)   Continue full comfort measures.  Anticipate in-hospital death.  Palliative care medicine is following.  Discussed with daughter my recommendation for initiating IV morphine drip.  She was in agreement.   In agreement with assessment of the pressure ulcer as below:  Pressure Injury 05/30/22 Buttocks Left Stage 2 -  Partial thickness loss of dermis presenting as a shallow open injury with a red, pink wound bed without slough. very small stage 2 pink in color skin pink around it (Active)  05/30/22 1950  Location: Buttocks  Location Orientation: Left  Staging: Stage 2 -  Partial thickness loss of dermis presenting as a shallow open injury with a red, pink wound bed without slough.  Wound Description (Comments): very small stage 2 pink in color skin pink around it  Present on Admission:   Dressing Type Pressure dressing June 13, 2022 0800     Family Communication: Daughter [name] at bedside Disposition Plan:  Status is: Inpatient Remains inpatient appropriate because: Anticipate in-hospital death   Antimicrobials:  Anti-infectives (From admission, onward)    Start     Dose/Rate Route Frequency Ordered Stop   06/01/22 1000  vancomycin (VANCOCIN) IVPB 1000 mg/200 mL premix  Status:  Discontinued  1,000 mg 200 mL/hr over 60 Minutes Intravenous Every 36 hours 05/30/22 2124 06/02/22 1225   05/31/22 1000  cefTRIAXone (ROCEPHIN) 2 g in sodium chloride 0.9 % 100 mL IVPB  Status:  Discontinued        2 g 200 mL/hr over 30 Minutes Intravenous Every 24 hours 05/31/22 0904 06/02/22 1225   05/30/22 2215   vancomycin (VANCOREADY) IVPB 1500 mg/300 mL        1,500 mg 150 mL/hr over 120 Minutes Intravenous  Once 05/30/22 2124 05/31/22 0158   05/30/22 2200  ceFEPIme (MAXIPIME) 2 g in sodium chloride 0.9 % 100 mL IVPB        2 g 200 mL/hr over 30 Minutes Intravenous  Once 05/30/22 2108 05/30/22 2329   05/30/22 2200  metroNIDAZOLE (FLAGYL) IVPB 500 mg  Status:  Discontinued        500 mg 100 mL/hr over 60 Minutes Intravenous Every 12 hours 05/30/22 2108 06/02/22 1225   05/30/22 2200  vancomycin (VANCOCIN) IVPB 1000 mg/200 mL premix  Status:  Discontinued        1,000 mg 200 mL/hr over 60 Minutes Intravenous  Once 05/30/22 2108 05/30/22 2111   05/30/22 2200  vancomycin (VANCOREADY) IVPB 1500 mg/300 mL        1,500 mg 150 mL/hr over 120 Minutes Intravenous  Once 05/30/22 2111 05/31/22 0155   05/30/22 2200  ceFEPIme (MAXIPIME) 2 g in sodium chloride 0.9 % 100 mL IVPB  Status:  Discontinued        2 g 200 mL/hr over 30 Minutes Intravenous Every 12 hours 05/30/22 2124 05/31/22 0904   05/27/22 0000  vancomycin (VANCOCIN) IVPB 1000 mg/200 mL premix  Status:  Discontinued        1,000 mg 200 mL/hr over 60 Minutes Intravenous Every 48 hours 05/25/22 0052 05/25/22 0150   05/26/22 0000  azithromycin (ZITHROMAX) 500 mg in sodium chloride 0.9 % 250 mL IVPB        500 mg 250 mL/hr over 60 Minutes Intravenous Every 24 hours 05/25/22 0102 05/30/22 0154   05/25/22 2200  ceFEPIme (MAXIPIME) 2 g in sodium chloride 0.9 % 100 mL IVPB  Status:  Discontinued        2 g 200 mL/hr over 30 Minutes Intravenous Every 24 hours 05/25/22 0052 05/25/22 0150   05/25/22 2200  cefTRIAXone (ROCEPHIN) 2 g in sodium chloride 0.9 % 100 mL IVPB        2 g 200 mL/hr over 30 Minutes Intravenous Every 24 hours 05/25/22 0102 05/28/22 2116   05/25/22 0000  ceFEPIme (MAXIPIME) 2 g in sodium chloride 0.9 % 100 mL IVPB        2 g 200 mL/hr over 30 Minutes Intravenous  Once 05/26/2022 2249 05/25/22 0011   06/09/2022 2315  metroNIDAZOLE  (FLAGYL) IVPB 500 mg  Status:  Discontinued        500 mg 100 mL/hr over 60 Minutes Intravenous 2 times daily 05/22/2022 2246 05/25/22 0150   05/19/2022 2300  vancomycin (VANCOREADY) IVPB 1500 mg/300 mL        1,500 mg 150 mL/hr over 120 Minutes Intravenous  Once 05/21/2022 2249 05/25/22 0345   05/30/2022 2145  cefTRIAXone (ROCEPHIN) 1 g in sodium chloride 0.9 % 100 mL IVPB        1 g 200 mL/hr over 30 Minutes Intravenous  Once 06/14/2022 2131 05/18/2022 2305   05/28/2022 2145  azithromycin (ZITHROMAX) 500 mg in sodium chloride 0.9 % 250 mL IVPB  500 mg 250 mL/hr over 60 Minutes Intravenous  Once 06/04/2022 2131 05/25/22 0138        Objective: Vitals:   06/10/22 0507 06/10/22 0819 06/10/22 2052 Jun 19, 2022 0824  BP: 134/64 129/73 117/69 (!) 77/48  Pulse: 95 99 (!) 101 (!) 117  Resp: (!) 24 17 (!) 26   Temp:  99 F (37.2 C)  (!) 101.3 F (38.5 C)  TempSrc:  Oral  Oral  SpO2: 98% 90% (!) 89% (!) 77%  Weight:      Height:       No intake or output data in the 24 hours ending 2022/06/19 1155 Filed Weights   05/18/2022 1917  Weight: 79.8 kg    Examination:  General exam: Somnolent, using accessory muscles, moaning  Data Reviewed: I have personally reviewed following labs and imaging studies  CBC: No results for input(s): "WBC", "NEUTROABS", "HGB", "HCT", "MCV", "PLT" in the last 168 hours.  Basic Metabolic Panel: No results for input(s): "NA", "K", "CL", "CO2", "GLUCOSE", "BUN", "CREATININE", "CALCIUM", "MG", "PHOS" in the last 168 hours.  GFR: Estimated Creatinine Clearance: 38.2 mL/min (A) (by C-G formula based on SCr of 1.02 mg/dL (H)). Liver Function Tests: No results for input(s): "AST", "ALT", "ALKPHOS", "BILITOT", "PROT", "ALBUMIN" in the last 168 hours. No results for input(s): "LIPASE", "AMYLASE" in the last 168 hours. No results for input(s): "AMMONIA" in the last 168 hours. Coagulation Profile: No results for input(s): "INR", "PROTIME" in the last 168 hours. Cardiac  Enzymes: No results for input(s): "CKTOTAL", "CKMB", "CKMBINDEX", "TROPONINI" in the last 168 hours. BNP (last 3 results) No results for input(s): "PROBNP" in the last 8760 hours. HbA1C: No results for input(s): "HGBA1C" in the last 72 hours. CBG: No results for input(s): "GLUCAP" in the last 168 hours. Lipid Profile: No results for input(s): "CHOL", "HDL", "LDLCALC", "TRIG", "CHOLHDL", "LDLDIRECT" in the last 72 hours. Thyroid Function Tests: No results for input(s): "TSH", "T4TOTAL", "FREET4", "T3FREE", "THYROIDAB" in the last 72 hours. Anemia Panel: No results for input(s): "VITAMINB12", "FOLATE", "FERRITIN", "TIBC", "IRON", "RETICCTPCT" in the last 72 hours. Sepsis Labs: No results for input(s): "PROCALCITON", "LATICACIDVEN" in the last 168 hours.  Recent Results (from the past 240 hour(s))  Respiratory (~20 pathogens) panel by PCR     Status: None   Collection Time: 06/01/22 12:16 PM   Specimen: Nasopharyngeal Swab; Respiratory  Result Value Ref Range Status   Adenovirus NOT DETECTED NOT DETECTED Final   Coronavirus 229E NOT DETECTED NOT DETECTED Final    Comment: (NOTE) The Coronavirus on the Respiratory Panel, DOES NOT test for the novel  Coronavirus (2019 nCoV)    Coronavirus HKU1 NOT DETECTED NOT DETECTED Final   Coronavirus NL63 NOT DETECTED NOT DETECTED Final   Coronavirus OC43 NOT DETECTED NOT DETECTED Final   Metapneumovirus NOT DETECTED NOT DETECTED Final   Rhinovirus / Enterovirus NOT DETECTED NOT DETECTED Final   Influenza A NOT DETECTED NOT DETECTED Final   Influenza B NOT DETECTED NOT DETECTED Final   Parainfluenza Virus 1 NOT DETECTED NOT DETECTED Final   Parainfluenza Virus 2 NOT DETECTED NOT DETECTED Final   Parainfluenza Virus 3 NOT DETECTED NOT DETECTED Final   Parainfluenza Virus 4 NOT DETECTED NOT DETECTED Final   Respiratory Syncytial Virus NOT DETECTED NOT DETECTED Final   Bordetella pertussis NOT DETECTED NOT DETECTED Final   Bordetella  Parapertussis NOT DETECTED NOT DETECTED Final   Chlamydophila pneumoniae NOT DETECTED NOT DETECTED Final   Mycoplasma pneumoniae NOT DETECTED NOT DETECTED Final  Comment: Performed at [facility], [place] [address] [place], [place] [address]  Resp panel by RT-PCR (RSV, Flu A&B, Covid) Anterior Nasal Swab     Status: None   Collection Time: 06/01/22 12:16 PM   Specimen: Anterior Nasal Swab  Result Value Ref Range Status   SARS Coronavirus 2 by RT PCR NEGATIVE NEGATIVE Final    Comment: (NOTE) SARS-CoV-2 target nucleic acids are NOT DETECTED.  The SARS-CoV-2 RNA is generally detectable in upper respiratory specimens during the acute phase of infection. The lowest concentration of SARS-CoV-2 viral copies this assay can detect is 138 copies/mL. A negative result does not preclude SARS-Cov-2 infection and should not be used as the sole basis for treatment or other patient management decisions. A negative result may occur with  improper specimen collection/handling, submission of specimen other than nasopharyngeal swab, presence of viral mutation(s) within the areas targeted by this assay, and inadequate number of viral copies(<138 copies/mL). A negative result must be combined with clinical observations, patient history, and epidemiological information. The expected result is Negative.  Fact Sheet for Patients:  EntrepreneurPulse.com.au  Fact Sheet for Healthcare Providers:  IncredibleEmployment.be  This test is no t yet approved or cleared by the [place] FDA and  has been authorized for detection and/or diagnosis of SARS-CoV-2 by FDA under an Emergency Use Authorization (EUA). This EUA will remain  in effect (meaning this test can be used) for the duration of the COVID-19 declaration under Section 564(b)(1) of the Act, 21 U.S.C.section 360bbb-3(b)(1), unless the authorization is terminated  or revoked sooner.       Influenza A by  PCR NEGATIVE NEGATIVE Final   Influenza B by PCR NEGATIVE NEGATIVE Final    Comment: (NOTE) The Xpert Xpress SARS-CoV-2/FLU/RSV plus assay is intended as an aid in the diagnosis of influenza from Nasopharyngeal swab specimens and should not be used as a sole basis for treatment. Nasal washings and aspirates are unacceptable for Xpert Xpress SARS-CoV-2/FLU/RSV testing.  Fact Sheet for Patients: EntrepreneurPulse.com.au  Fact Sheet for Healthcare Providers: IncredibleEmployment.be  This test is not yet approved or cleared by the [place] FDA and has been authorized for detection and/or diagnosis of SARS-CoV-2 by FDA under an Emergency Use Authorization (EUA). This EUA will remain in effect (meaning this test can be used) for the duration of the COVID-19 declaration under Section 564(b)(1) of the Act, 21 U.S.C. section 360bbb-3(b)(1), unless the authorization is terminated or revoked.     Resp Syncytial Virus by PCR NEGATIVE NEGATIVE Final    Comment: (NOTE) Fact Sheet for Patients: EntrepreneurPulse.com.au  Fact Sheet for Healthcare Providers: IncredibleEmployment.be  This test is not yet approved or cleared by the [place] FDA and has been authorized for detection and/or diagnosis of SARS-CoV-2 by FDA under an Emergency Use Authorization (EUA). This EUA will remain in effect (meaning this test can be used) for the duration of the COVID-19 declaration under Section 564(b)(1) of the Act, 21 U.S.C. section 360bbb-3(b)(1), unless the authorization is terminated or revoked.  Performed at [facility], [place] [address], [place], [place] [address]       Radiology Studies: No results found.    Scheduled Meds:   Continuous Infusions:  morphine 1 mg/hr (06-18-22 1108)   promethazine (PHENERGAN) injection (IM or IVPB) Stopped (06/01/22 1315)     LOS: 18 days   Time spent: 20 minutes    Dessa Phi, DO [facility] 18-Jun-2022, 11:55 AM   Available via Epic secure chat  7am-7pm After these hours, please refer to coverage provider listed on [email or web address]

## 2022-06-16 NOTE — Progress Notes (Signed)
Patient continues to grunt , moan and need comfort from daughter to relax. Respirations continue ranging 30-36. Mouth breathing and mouth puffing.

## 2022-06-16 NOTE — Progress Notes (Signed)
1150 Patient pronounced. Two nurses listened to apical pulse and palpated radial pulse for 1 minute. No pulse heard or felt.  Pt daughter in room and niece on the way. Dr. Dessa Phi contacted.

## 2022-06-16 DEATH — deceased

## 2022-10-05 ENCOUNTER — Encounter (HOSPITAL_COMMUNITY): Payer: Medicare Other

## 2022-10-20 ENCOUNTER — Encounter (HOSPITAL_COMMUNITY): Payer: Medicare Other
